# Patient Record
Sex: Male | Born: 1955 | Race: White | Hispanic: No | Marital: Single | Smoking: Former smoker
Health system: Southern US, Community
[De-identification: ages and names within clinical notes are randomized; demographics above are authoritative.]

## PROBLEM LIST (undated history)

## (undated) DIAGNOSIS — I499 Cardiac arrhythmia, unspecified: Secondary | ICD-10-CM

## (undated) DIAGNOSIS — R200 Anesthesia of skin: Secondary | ICD-10-CM

## (undated) DIAGNOSIS — M51379 Other intervertebral disc degeneration, lumbosacral region without mention of lumbar back pain or lower extremity pain: Secondary | ICD-10-CM

## (undated) DIAGNOSIS — Z8489 Family history of other specified conditions: Secondary | ICD-10-CM

## (undated) DIAGNOSIS — G4730 Sleep apnea, unspecified: Secondary | ICD-10-CM

## (undated) DIAGNOSIS — M5430 Sciatica, unspecified side: Secondary | ICD-10-CM

## (undated) DIAGNOSIS — I4819 Other persistent atrial fibrillation: Secondary | ICD-10-CM

## (undated) DIAGNOSIS — I4892 Unspecified atrial flutter: Secondary | ICD-10-CM

## (undated) DIAGNOSIS — M549 Dorsalgia, unspecified: Secondary | ICD-10-CM

## (undated) DIAGNOSIS — K219 Gastro-esophageal reflux disease without esophagitis: Secondary | ICD-10-CM

## (undated) DIAGNOSIS — M5137 Other intervertebral disc degeneration, lumbosacral region: Secondary | ICD-10-CM

## (undated) HISTORY — DX: Other intervertebral disc degeneration, lumbosacral region without mention of lumbar back pain or lower extremity pain: M51.379

## (undated) HISTORY — PX: HERNIA REPAIR: SHX51

## (undated) HISTORY — PX: LUMBAR SPINE SURGERY: SHX701

## (undated) HISTORY — DX: Other persistent atrial fibrillation: I48.19

## (undated) HISTORY — DX: Sciatica, unspecified side: M54.30

## (undated) HISTORY — DX: Other intervertebral disc degeneration, lumbosacral region: M51.37

## (undated) HISTORY — PX: HEMORROIDECTOMY: SUR656

## (undated) HISTORY — DX: Dorsalgia, unspecified: M54.9

## (undated) HISTORY — PX: ADENOIDECTOMY: SUR15

## (undated) HISTORY — PX: TONSILLECTOMY: SUR1361

## (undated) HISTORY — PX: EYE SURGERY: SHX253

## (undated) HISTORY — DX: Unspecified atrial flutter: I48.92

## (undated) HISTORY — PX: APPENDECTOMY: SHX54

---
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
## 2001-05-13 ENCOUNTER — Emergency Department (HOSPITAL_COMMUNITY): Admission: EM | Admit: 2001-05-13 | Discharge: 2001-05-14 | Payer: Self-pay | Admitting: Emergency Medicine

## 2001-05-14 ENCOUNTER — Encounter: Payer: Self-pay | Admitting: Emergency Medicine

## 2003-12-11 ENCOUNTER — Emergency Department (HOSPITAL_COMMUNITY): Admission: EM | Admit: 2003-12-11 | Discharge: 2003-12-12 | Payer: Self-pay | Admitting: Emergency Medicine

## 2004-07-19 ENCOUNTER — Encounter (INDEPENDENT_AMBULATORY_CARE_PROVIDER_SITE_OTHER): Payer: Self-pay | Admitting: Specialist

## 2004-07-19 ENCOUNTER — Ambulatory Visit (HOSPITAL_COMMUNITY): Admission: RE | Admit: 2004-07-19 | Discharge: 2004-07-19 | Payer: Self-pay | Admitting: General Surgery

## 2009-03-15 ENCOUNTER — Encounter: Admission: RE | Admit: 2009-03-15 | Discharge: 2009-03-15 | Payer: Self-pay | Admitting: Internal Medicine

## 2009-09-21 ENCOUNTER — Encounter (INDEPENDENT_AMBULATORY_CARE_PROVIDER_SITE_OTHER): Payer: Self-pay | Admitting: *Deleted

## 2009-09-22 ENCOUNTER — Encounter (INDEPENDENT_AMBULATORY_CARE_PROVIDER_SITE_OTHER): Payer: Self-pay

## 2009-09-26 ENCOUNTER — Ambulatory Visit: Payer: Self-pay | Admitting: Gastroenterology

## 2009-10-02 ENCOUNTER — Ambulatory Visit: Payer: Self-pay | Admitting: Gastroenterology

## 2009-12-21 ENCOUNTER — Encounter: Admission: RE | Admit: 2009-12-21 | Discharge: 2009-12-21 | Payer: Self-pay | Admitting: Internal Medicine

## 2010-03-08 ENCOUNTER — Encounter
Admission: RE | Admit: 2010-03-08 | Discharge: 2010-03-08 | Payer: Self-pay | Source: Home / Self Care | Attending: General Surgery | Admitting: General Surgery

## 2010-03-25 DIAGNOSIS — I4892 Unspecified atrial flutter: Secondary | ICD-10-CM

## 2010-03-25 HISTORY — DX: Unspecified atrial flutter: I48.92

## 2010-04-14 ENCOUNTER — Encounter: Payer: Self-pay | Admitting: Internal Medicine

## 2010-04-24 NOTE — Letter (Signed)
Summary: Previsit letter  [facility]  [address] [place], [place] [address]   Phone: [phone]  Fax: [phone]       09/21/2009 MRN: [id number]  Craig Morgan [address] [place], [place]  [address]  Dear Mr. GLASPY,  Welcome to the Gastroenterology Division at [facility].    You are scheduled to see a nurse for your pre-procedure visit on 09/26/2009 at 8:30am on the 3rd floor at [facility], [address]  We ask that you try to arrive at our office 15 minutes prior to your appointment time to allow for check-in.  Your nurse visit will consist of discussing your medical and surgical history, your immediate family medical history, and your medications.    Please bring a complete list of all your medications or, if you prefer, bring the medication bottles and we will list them.  We will need to be aware of both prescribed and over the counter drugs.  We will need to know exact dosage information as well.  If you are on blood thinners (Coumadin, Plavix, Aggrenox, Ticlid, etc.) please call our office today/prior to your appointment, as we need to consult with your physician about holding your medication.   Please be prepared to read and sign documents such as consent forms, a financial agreement, and acknowledgement forms.  If necessary, and with your consent, a friend or relative is welcome to sit-in on the nurse visit with you.  Please bring your insurance card so that we may make a copy of it.  If your insurance requires a referral to see a specialist, please bring your referral form from your primary care physician.  No co-pay is required for this nurse visit.     If you cannot keep your appointment, please call [phone] to cancel or reschedule prior to your appointment date.  This allows [place] the opportunity to schedule an appointment for another patient in need of care.    Thank you for choosing [facility] for your medical  needs.  We appreciate the opportunity to care for you.  Please visit [place] at our website  to learn more about our practice.                     Sincerely.                                                                                                                   The Gastroenterology Division

## 2010-04-24 NOTE — Procedures (Signed)
Summary: Colonoscopy  Patient: [name] Note: All result statuses are Final unless otherwise noted.  Tests: (1) Colonoscopy (COL)   COL Colonoscopy           DONE     [facility]     [address]     [place], [place]  [address]           COLONOSCOPY PROCEDURE REPORT           PATIENT:  Craig Morgan, Craig Morgan  MR#:  [id number]     BIRTHDATE:  [birth year], 54 yrs. old  GENDER:  male     ENDOSCOPIST:  Rachael Fee, MD     REF. BY:  Tomi Bamberger, NP     PROCEDURE DATE:  10/02/2009     PROCEDURE:  Diagnostic Colonoscopy     ASA CLASS:  Class II     INDICATIONS:  Routine Risk Screening     MEDICATIONS:   Fentanyl 100 mcg IV, Versed 10 mg IV     DESCRIPTION OF PROCEDURE:   After the risks benefits and     alternatives of the procedure were thoroughly explained, informed     consent was obtained.  Digital rectal exam was performed and     revealed no rectal masses.   The LB PCF-[id number] endoscope     was introduced through the anus and advanced to the cecum, which     was identified by both the appendix and ileocecal valve, without     limitations.  The quality of the prep was adequate, using     MoviPrep.  The instrument was then slowly withdrawn as the colon     was fully examined.     <<PROCEDUREIMAGES>>     FINDINGS:  Mild diverticulosis was found in the sigmoid to     descending colon segments (see image1).  This was otherwise a     normal examination of the colon (see image2, image3, and image4).     Retroflexed views in the rectum revealed no abnormalities.    The     scope was then withdrawn from the patient and the procedure     completed.           COMPLICATIONS:  None           ENDOSCOPIC IMPRESSION:     1) Mild diverticulosis in the sigmoid to descending colon     segments     2) Otherwise normal examination; NO polyps or cancers           RECOMMENDATIONS:     1) Continue current colorectal screening recommendations for     "routine risk" patients  with a repeat colonoscopy in 10 years.           REPEAT EXAM:  10 years           ______________________________     Rachael Fee, MD           n.     eSIGNED:   Rachael Fee at 10/02/2009 02:22 PM           Cecilio Asper, [id number]  Note: An exclamation mark (!) indicates a result that was not dispersed into the flowsheet. Document Creation Date: 10/02/2009 2:23 PM _______________________________________________________________________  (1) Order result status: Final Collection or observation date-time: 10/02/2009 14:20 Requested date-time:  Receipt date-time:  Reported date-time:  Referring Physician:   Ordering Physician: Rob Bunting [phone]) Specimen Source:  Source: Launa Grill Order Number: [id number]  Lab site:

## 2010-04-24 NOTE — Letter (Signed)
Summary: [facility] Instructions  [facility]  [address] [place], [place] [address]   Phone: [phone]  Fax: [phone]       Craig Morgan    1955-06-21    MRN: [id number]        Procedure Day /Date:  Monday 10/02/2009     Arrival Time: 1:00 pm      Procedure Time: 2:00 pm     Location of Procedure:                    _ x_  [facility] (4th Floor)                        PREPARATION FOR COLONOSCOPY WITH MOVIPREP   Starting 5 days prior to your procedure Wednesday 7/6 do not eat nuts, seeds, popcorn, corn, beans, peas,  salads, or any raw vegetables.  Do not take any fiber supplements (e.g. Metamucil, Citrucel, and Benefiber).  THE DAY BEFORE YOUR PROCEDURE         DATE: Sunday 7/10  1.  Drink clear liquids the entire day-NO SOLID FOOD  2.  Do not drink anything colored red or purple.  Avoid juices with pulp.  No orange juice.  3.  Drink at least 64 oz. (8 glasses) of fluid/clear liquids during the day to prevent dehydration and help the prep work efficiently.  CLEAR LIQUIDS INCLUDE: Water Jello Ice Popsicles Tea (sugar ok, no milk/cream) Powdered fruit flavored drinks Coffee (sugar ok, no milk/cream) Gatorade Juice: apple, white grape, white cranberry  Lemonade Clear bullion, consomm, broth Carbonated beverages (any kind) Strained chicken noodle soup Hard Candy                             4.  In the morning, mix first dose of MoviPrep solution:    Empty 1 Pouch A and 1 Pouch B into the disposable container    Add lukewarm drinking water to the top line of the container. Mix to dissolve    Refrigerate (mixed solution should be used within 24 hrs)  5.  Begin drinking the prep at 5:00 p.m. The MoviPrep container is divided by 4 marks.   Every 15 minutes drink the solution down to the next mark (approximately 8 oz) until the full liter is complete.   6.  Follow completed prep with 16 oz of clear liquid of your choice (Nothing  red or purple).  Continue to drink clear liquids until bedtime.  7.  Before going to bed, mix second dose of MoviPrep solution:    Empty 1 Pouch A and 1 Pouch B into the disposable container    Add lukewarm drinking water to the top line of the container. Mix to dissolve    Refrigerate  THE DAY OF YOUR PROCEDURE      DATE: Monday 7/11  Beginning at  9:00 a.m. (5 hours before procedure):         1. Every 15 minutes, drink the solution down to the next mark (approx 8 oz) until the full liter is complete.  2. Follow completed prep with 16 oz. of clear liquid of your choice.    3. You may drink clear liquids until 12:00 pm  (2 HOURS BEFORE PROCEDURE).   MEDICATION INSTRUCTIONS  Unless otherwise instructed, you should take regular prescription medications with a small sip of water   as early as possible the  morning of your procedure.         OTHER INSTRUCTIONS  You will need a responsible adult at least 53 years of age to accompany you and drive you home.   This person must remain in the waiting room during your procedure.  Wear loose fitting clothing that is easily removed.  Leave jewelry and other valuables at home.  However, you may wish to bring a book to read or  an iPod/MP3 player to listen to music as you wait for your procedure to start.  Remove all body piercing jewelry and leave at home.  Total time from sign-in until discharge is approximately 2-3 hours.  You should go home directly after your procedure and rest.  You can resume normal activities the  day after your procedure.  The day of your procedure you should not:   Drive   Make legal decisions   Operate machinery   Drink alcohol   Return to work  You will receive specific instructions about eating, activities and medications before you leave.    The above instructions have been reviewed and explained to me by   Ulis Rias RN  September 26, 2009 3:41 PM     I fully understand and can  verbalize these instructions _____________________________ Date _________

## 2010-04-24 NOTE — Procedures (Signed)
Summary: Colonoscopy   Procedures Next Due Date:    Colonoscopy: 09/2019 

## 2010-04-24 NOTE — Miscellaneous (Signed)
Summary: Lec previsit  Clinical Lists Changes  Medications: Added new medication of MOVIPREP 100 GM  SOLR (PEG-KCL-NACL-NASULF-NA ASC-C) As per prep instructions. - Signed Rx of MOVIPREP 100 GM  SOLR (PEG-KCL-NACL-NASULF-NA ASC-C) As per prep instructions.;  #1 x 0;  Signed;  Entered by: Ulis Rias RN;  Authorized by: Rachael Fee MD;  Method used: Electronically to [facility]  [address] [place], [place]  [address], Ph: [phone] or [phone], Fax: [phone] Allergies: Added new allergy or adverse reaction of PENICILLIN Observations: Added new observation of NKA: F (09/26/2009 15:23)    Prescriptions: MOVIPREP 100 GM  SOLR (PEG-KCL-NACL-NASULF-NA ASC-C) As per prep instructions.  #1 x 0   Entered by:   Ulis Rias RN   Authorized by:   Rachael Fee MD   Signed by:   Ulis Rias RN on 09/26/2009   Method used:   [facility]        [facility]  [address] (retail)       [address]       [place], [place]  [address]       Ph: [phone] or [phone]       Fax: [phone]   RxID:   [id number]

## 2010-08-10 NOTE — Op Note (Signed)
NAMEHASAN, DOUSE NO.:  [id number]   MEDICAL RECORD NO.:  [id number]          PATIENT TYPE:  AMB   LOCATION:  DAY                          FACILITY[facility]  [facility]   PHYSICIAN:  Leonie Man, M.D.   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   PROCEDURE:  Procedure for prolapsing hemorrhoids.   SURGEON:  Leonie Man, M.D.   ASSISTANT:  OR tech.   ANESTHESIA:  General.   ESTIMATED BLOOD LOSS:  Minimal.   COMPLICATIONS:  None.  Patient returns to the PACU in good condition.   NOTE:  Mr. 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  He has had recurrent flareups with pain in  the past.  He sometimes has pain for several months.  He comes to the  operating room now after the risks and potential benefits of surgery for  hemorrhoidectomy have been fully discussed and consent obtained .   PROCEDURE:  Following induction of satisfactory general anesthesia, the  patient was placed in the prone jack-knife position.  The buttock cheeks are  spread apart.  Perianal tissues are prepped and draped in the sterile  operative field.  Perianal infiltration of 0.25% Marcaine with epinephrine  is carried out, and then dilatation of the anal verge of approximately three  fingerbreadths is then done.  An operating anoscope is placed into the anus,  and a purse-string suture is on the entire anal verge.  The PPH stapling  device is then placed with the anvil above the suture line, and the suture  is drawn taut and tight.  The [facility] stapler is then applied and screwed down  up to 4 cm into the anus with 4 cm at the anal verge.  The stapler was left  in place over approximately one minute for edema and for hemostasis.  The  suture line was then thoroughly inspected.  Additional bleeding points were  then treated with electrocautery.  There were two very large hemorrhoids at  the 11 o'clock axis with the patient in the prone position, and rubber bands  were placed on these hemorrhoids.  Dissection checked for hemostasis.  Sponge and instrument counts  verified.  I placed a Gelfoam pack along the suture line for additional  hemostasis.  The anesthetic was then reversed after sterile dressings were  placed at the anus.  The patient then removed from the operating room to the  recovery room in stable condition.  Tolerated the procedure.      PB/MEDQ  D:  07/19/2004  T:  07/19/2004  Job:  [id number]

## 2010-09-24 ENCOUNTER — Encounter: Payer: Self-pay | Admitting: Gastroenterology

## 2010-11-02 ENCOUNTER — Ambulatory Visit (INDEPENDENT_AMBULATORY_CARE_PROVIDER_SITE_OTHER): Payer: BC Managed Care – PPO | Admitting: Gastroenterology

## 2010-11-02 ENCOUNTER — Encounter: Payer: Self-pay | Admitting: Gastroenterology

## 2010-11-02 VITALS — BP 138/76 | HR 80 | Wt 259.0 lb

## 2010-11-02 DIAGNOSIS — R55 Syncope and collapse: Secondary | ICD-10-CM

## 2010-11-02 DIAGNOSIS — R112 Nausea with vomiting, unspecified: Secondary | ICD-10-CM

## 2010-11-02 NOTE — Patient Instructions (Addendum)
You will be set up for an upper endoscopy, call back if you decide to schedule your endoscopy. If you have a $700 ded you must pay your ded and then your responsible for 20% of about $1500 cost.  A copy of this information will be made available to Dr. Toni Arthurs, Dr. Nadara Eaton. Some of your meds can cause passing out (requip) hypotension (tizanidine); these may contribute to your symptoms, dizziness (gabapentin).

## 2010-11-02 NOTE — Progress Notes (Signed)
HPI: This is a  very pleasant 55 year old man  Has been having nausea, vomiting after dinner.  Has passed out on 2 of the occasions.  Has had 5-6 episodes of the n/v.  Was passed out for 30 minutes on one occasion, was a hot day.  He gets nauseas, light headed.  His cardiologist feels this is cardiac related.  Told him that GI issue is not is problem (Dr. Nadara Eaton).  Takes RLS med for 3 years, very helpful.  Started tizanidine.  No abd pain with the epidsodes, no dysphagia.  Has noticed HR can vary quite a lot.  Up to 130, down to 80s.   Recent BBlocker started, but then stopped.   Review of systems: Pertinent positive and negative review of systems were noted in the above HPI section.  All other review of systems was otherwise negative.   History reviewed. No pertinent past medical history.  Past Surgical History  Procedure Date  . Hernia repair     x2  . Hemorroidectomy   . Adenoidectomy   . Appendectomy      reports that he has quit smoking. He uses smokeless tobacco. He reports that he drinks alcohol. He reports that he does not use illicit drugs.  family history includes COPD in his mother and Heart disease in his father.    Current Medications, Allergies were all reviewed with the patient via [facility] electronic medical record system.    Physical Exam: BP 138/76  Pulse 80  Wt 259 lb (117.482 kg) Constitutional: generally well-appearing Psychiatric: alert and oriented x3 Eyes: extraocular movements intact Mouth: oral pharynx moist, no lesions Neck: supple no lymphadenopathy Cardiovascular: heart regular rate and rhythm Lungs: clear to auscultation bilaterally Abdomen: soft, nontender, nondistended, no obvious ascites, no peritoneal signs, normal bowel sounds Extremities: no lower extremity edema bilaterally Skin: no lesions on visible extremities    Assessment and plan: 55 y.o. male with intermittent nausea vomiting syncope  He has had 5 or 6  episodes of nausea, vomiting while eating. 2 of these episodes were associated with syncope.  He also describes other even sober the past several months of orthostatic hypotension. Being very dizzy when standing up. One of his medicines as this is a potential side effect, this is his medicine to treat his restless leg syndrome. He does not want to try stopping that medicine because he tells me he can not sleep at all without it. I think an EGD is reasonable given his nausea and vomiting however I suspect that his symptoms are more vasovagal, either primary cardiac related or perhaps related to some of his medicines. Gabapentin also causes dizziness. He is going to see how much his out-of-pocket expense will be for an EGD and I was quite clear that I think it is reasonable to proceed with the test I think he will probably be within normal and will not explain his symptoms.

## 2011-06-17 ENCOUNTER — Ambulatory Visit: Payer: BC Managed Care – PPO

## 2011-06-17 ENCOUNTER — Ambulatory Visit (INDEPENDENT_AMBULATORY_CARE_PROVIDER_SITE_OTHER): Payer: BC Managed Care – PPO | Admitting: Emergency Medicine

## 2011-06-17 DIAGNOSIS — M546 Pain in thoracic spine: Secondary | ICD-10-CM

## 2011-06-17 DIAGNOSIS — J45909 Unspecified asthma, uncomplicated: Secondary | ICD-10-CM

## 2011-06-17 DIAGNOSIS — M545 Low back pain, unspecified: Secondary | ICD-10-CM

## 2011-06-17 DIAGNOSIS — R0683 Snoring: Secondary | ICD-10-CM

## 2011-06-17 DIAGNOSIS — J40 Bronchitis, not specified as acute or chronic: Secondary | ICD-10-CM

## 2011-06-17 LAB — LIPID PANEL
Cholesterol: 163 mg/dL (ref 0–200)
HDL: 38 mg/dL — ABNORMAL LOW (ref >39)
LDL Cholesterol: 104 mg/dL — ABNORMAL HIGH (ref 0–99)
Total CHOL/HDL Ratio: 4.3 ratio
Triglycerides: 104 mg/dL (ref <150)
VLDL: 21 mg/dL (ref 0–40)

## 2011-06-17 LAB — POCT CBC
Granulocyte percent: 66.7 % (ref 37–80)
HCT, POC: 40.7 % — AB (ref 43.5–53.7)
Hemoglobin: 13.4 g/dL — AB (ref 14.1–18.1)
Lymph, poc: 1.2 (ref 0.6–3.4)
MCH, POC: 33.3 pg — AB (ref 27–31.2)
MCHC: 32.9 g/dL (ref 31.8–35.4)
MCV: 101 fL — AB (ref 80–97)
MID (cbc): 0.4 (ref 0–0.9)
MPV: 8.2 fL (ref 0–99.8)
POC Granulocyte: 3.3 (ref 2–6.9)
POC LYMPH PERCENT: 24.3 % (ref 10–50)
POC MID %: 9 % (ref 0–12)
Platelet Count, POC: 245 K/uL (ref 142–424)
RBC: 4.03 M/uL — AB (ref 4.69–6.13)
RDW, POC: 13.8 %
WBC: 5 K/uL (ref 4.6–10.2)

## 2011-06-17 LAB — COMPREHENSIVE METABOLIC PANEL WITH GFR
ALT: 16 U/L (ref 0–53)
AST: 14 U/L (ref 0–37)
Albumin: 4.5 g/dL (ref 3.5–5.2)
Alkaline Phosphatase: 55 U/L (ref 39–117)
BUN: 12 mg/dL (ref 6–23)
CO2: 29 meq/L (ref 19–32)
Calcium: 9.5 mg/dL (ref 8.4–10.5)
Chloride: 105 meq/L (ref 96–112)
Creat: 0.99 mg/dL (ref 0.50–1.35)
Glucose, Bld: 88 mg/dL (ref 70–99)
Potassium: 4.7 meq/L (ref 3.5–5.3)
Sodium: 142 meq/L (ref 135–145)
Total Bilirubin: 0.8 mg/dL (ref 0.3–1.2)
Total Protein: 6.7 g/dL (ref 6.0–8.3)

## 2011-06-17 LAB — TSH: TSH: 1.441 u[IU]/mL (ref 0.350–4.500)

## 2011-06-17 MED ORDER — IPRATROPIUM BROMIDE 0.02 % IN SOLN
0.5000 mg | Freq: Once | RESPIRATORY_TRACT | Status: AC
  Administered 2011-06-17: 0.5 mg via RESPIRATORY_TRACT

## 2011-06-17 MED ORDER — HYDROCODONE-ACETAMINOPHEN 5-325 MG PO TABS: 1.0000 | ORAL_TABLET | ORAL | Status: AC | PRN

## 2011-06-17 MED ORDER — ALBUTEROL SULFATE (2.5 MG/3ML) 0.083% IN NEBU
2.5000 mg | INHALATION_SOLUTION | Freq: Once | RESPIRATORY_TRACT | Status: AC
  Administered 2011-06-17: 2.5 mg via RESPIRATORY_TRACT

## 2011-06-17 MED ORDER — ALBUTEROL SULFATE HFA 108 (90 BASE) MCG/ACT IN AERS: 2.0000 | INHALATION_SPRAY | Freq: Four times a day (QID) | RESPIRATORY_TRACT | Status: AC | PRN

## 2011-06-17 MED ORDER — LEVOFLOXACIN 500 MG PO TABS: 500.0000 mg | ORAL_TABLET | Freq: Every day | ORAL | Status: AC

## 2011-06-17 NOTE — Patient Instructions (Signed)

## 2011-06-17 NOTE — Progress Notes (Signed)
  Subjective:    Patient ID: Craig Morgan, male    DOB: [birth year], 56 y.o.   MRN: [id number]  HPI problems. He's been having three-week history of chest congestion which has been productive of a yellowish white thick mucus. He has no history of asthma. He's never used inhalers is also having significant pain in his right thoracic area. It hurts for him to take a big breath or twists. He also has had pain in his lower back but no radicular symptoms.    Review of Systems     Objective:   Physical Exam  Constitutional: He appears well-developed and well-nourished.  HENT:  Head: Normocephalic.  Eyes: Right eye exhibits no discharge. Left eye exhibits no discharge. No scleral icterus.  Neck: No JVD present. No tracheal deviation present. No thyromegaly present.  Cardiovascular: Normal rate and regular rhythm.  Exam reveals no gallop and no friction rub.   No murmur heard. Pulmonary/Chest: No respiratory distress. He has no wheezes.       He has a prolonged expiratory phase on lung examination however I did not hear any audible wheezes.  Lymphadenopathy:    He has no cervical adenopathy.   [facility] reading (PRIMARY) by  DrDaub chest x-ray shows no acute disease. T-spine films are unremarkable except for a slight scoliosis. T-spine films show a spondylolisthesis of L5 with a arthritic complex in that area         Assessment & Plan:  Patient have an upper and lower back discomfort. He also has reactive airways disease and evidence on exam of an acute bronchitis.

## 2011-09-30 ENCOUNTER — Ambulatory Visit (INDEPENDENT_AMBULATORY_CARE_PROVIDER_SITE_OTHER): Payer: BC Managed Care – PPO | Admitting: Emergency Medicine

## 2011-09-30 VITALS — BP 112/67 | HR 64 | Temp 98.3°F | Resp 18 | Ht 73.0 in | Wt 263.0 lb

## 2011-09-30 DIAGNOSIS — M5410 Radiculopathy, site unspecified: Secondary | ICD-10-CM

## 2011-09-30 DIAGNOSIS — M79609 Pain in unspecified limb: Secondary | ICD-10-CM

## 2011-09-30 DIAGNOSIS — R000 Tachycardia, unspecified: Secondary | ICD-10-CM

## 2011-09-30 DIAGNOSIS — R252 Cramp and spasm: Secondary | ICD-10-CM

## 2011-09-30 DIAGNOSIS — R200 Anesthesia of skin: Secondary | ICD-10-CM

## 2011-09-30 LAB — TSH: TSH: 1.977 u[IU]/mL (ref 0.350–4.500)

## 2011-09-30 LAB — POCT CBC
Granulocyte percent: 75 %G (ref 37–80)
HCT, POC: 47.7 % (ref 43.5–53.7)
Hemoglobin: 15 g/dL (ref 14.1–18.1)
MCV: 106.1 fL — AB (ref 80–97)
POC LYMPH PERCENT: 17.8 %L (ref 10–50)
RBC: 4.5 M/uL — AB (ref 4.69–6.13)
RDW, POC: 14.1 %

## 2011-09-30 LAB — COMPREHENSIVE METABOLIC PANEL
ALT: 25 U/L (ref 0–53)
Albumin: 5 g/dL (ref 3.5–5.2)
CO2: 21 mEq/L (ref 19–32)
Calcium: 9.8 mg/dL (ref 8.4–10.5)
Chloride: 103 mEq/L (ref 96–112)
Glucose, Bld: 111 mg/dL — ABNORMAL HIGH (ref 70–99)
Potassium: 4.5 mEq/L (ref 3.5–5.3)
Sodium: 139 mEq/L (ref 135–145)
Total Protein: 7.6 g/dL (ref 6.0–8.3)

## 2011-09-30 LAB — VITAMIN B12: Vitamin B-12: 370 pg/mL (ref 211–911)

## 2011-09-30 LAB — MAGNESIUM: Magnesium: 2.2 mg/dL (ref 1.5–2.5)

## 2011-09-30 MED ORDER — CYCLOBENZAPRINE HCL 10 MG PO TABS: ORAL_TABLET | ORAL | Status: DC

## 2011-09-30 NOTE — Progress Notes (Signed)
  Subjective:    Patient ID: Craig Morgan, male    DOB: 1955-07-09, 56 y.o.   MRN: [id number]  HPI patient enters with chief complaint of severe aching cramps primarily in his thighs but also somewhat in his lower legs. He has been to a neurologist and evaluated for possible peripheral neuropathy. End of both the for neurology and also the cornerstone to because of the severe pain in both of his legs. He denies weakness in his legs. He has been to Dr. Jacinto Halim for evaluation of chest pain. He states he has undergone multiple tests and has been told everything was okay. He states he has had recent episodes of tachycardia one of which was associated with a loss of consciousness. EMS was called   he did not go to the emergency room to    Review of Systems     Objective:   Physical Exam  Constitutional: He appears well-developed and well-nourished.  HENT:  Head: Normocephalic.  Eyes: Pupils are equal, round, and reactive to light.  Neck: No tracheal deviation present. No thyromegaly present.  Cardiovascular: Normal rate, regular rhythm and normal heart sounds.  Exam reveals no gallop and no friction rub.   No murmur heard. Neurological: He is alert. He displays normal reflexes. No cranial nerve deficit.       Patient has decreased sensation to fine touch decreased vibratory sensation of the lower extremities. There is no motor weakness noted there is some tenderness over the muscles.  Psychiatric: He has a normal mood and affect.   EKG no acute changes Results for orders placed in visit on 09/30/11  POCT CBC      Component Value Range   WBC 7.3  4.6 - 10.2 K/uL   Lymph, poc 1.3  0.6 - 3.4   POC LYMPH PERCENT 17.8  10 - 50 %L   MID (cbc) 0.5  0 - 0.9   POC MID % 7.2  0 - 12 %M   POC Granulocyte 5.5  2 - 6.9   Granulocyte percent 75.0  37 - 80 %G   RBC 4.50 (*) 4.69 - 6.13 M/uL   Hemoglobin 15.0  14.1 - 18.1 g/dL   HCT, POC 14.7  82.9 - 53.7 %   MCV 106.1 (*) 80 - 97 fL   MCH, POC 33.3  (*) 27 - 31.2 pg   MCHC 31.4 (*) 31.8 - 35.4 g/dL   RDW, POC 56.2     Platelet Count, POC 319  142 - 424 K/uL   MPV 8.5  0 - 99.8 fL        Assessment & Plan:  We'll check routine labs CPK sedimentation rate magnesium and potassium levels as sources of cramps .

## 2012-02-04 ENCOUNTER — Encounter (HOSPITAL_COMMUNITY): Payer: Self-pay | Admitting: *Deleted

## 2012-02-04 ENCOUNTER — Ambulatory Visit (HOSPITAL_COMMUNITY): Payer: BC Managed Care – PPO | Admitting: Anesthesiology

## 2012-02-04 ENCOUNTER — Encounter (HOSPITAL_COMMUNITY): Payer: Self-pay | Admitting: Anesthesiology

## 2012-02-04 ENCOUNTER — Ambulatory Visit (HOSPITAL_COMMUNITY)
Admission: RE | Admit: 2012-02-04 | Discharge: 2012-02-04 | Disposition: A | Discharge status: Home / Self Care | Payer: BC Managed Care – PPO | Source: Ambulatory Visit | Attending: Cardiology | Admitting: Cardiology

## 2012-02-04 ENCOUNTER — Encounter (HOSPITAL_COMMUNITY)
Admission: RE | Disposition: A | Discharge status: Home / Self Care | Payer: Self-pay | Source: Ambulatory Visit | Attending: Cardiology

## 2012-02-04 DIAGNOSIS — I4891 Unspecified atrial fibrillation: Secondary | ICD-10-CM | POA: Insufficient documentation

## 2012-02-04 DIAGNOSIS — K219 Gastro-esophageal reflux disease without esophagitis: Secondary | ICD-10-CM | POA: Insufficient documentation

## 2012-02-04 DIAGNOSIS — J45909 Unspecified asthma, uncomplicated: Secondary | ICD-10-CM | POA: Insufficient documentation

## 2012-02-04 HISTORY — PX: CARDIOVERSION: SHX1299

## 2012-02-04 HISTORY — DX: Gastro-esophageal reflux disease without esophagitis: K21.9

## 2012-02-04 HISTORY — DX: Cardiac arrhythmia, unspecified: I49.9

## 2012-02-04 HISTORY — DX: Sleep apnea, unspecified: G47.30

## 2012-02-04 SURGERY — CARDIOVERSION
Anesthesia: Monitor Anesthesia Care

## 2012-02-04 MED ORDER — SODIUM CHLORIDE 0.9 % IJ SOLN: 3.0000 mL | INTRAMUSCULAR | Status: DC | PRN

## 2012-02-04 MED ORDER — SODIUM CHLORIDE 0.9 % IV SOLN
250.0000 mL | INTRAVENOUS | Status: DC
  Administered 2012-02-04: 12:00:00 via INTRAVENOUS

## 2012-02-04 MED ORDER — SODIUM CHLORIDE 0.9 % IJ SOLN: 3.0000 mL | Freq: Two times a day (BID) | INTRAMUSCULAR | Status: DC

## 2012-02-04 MED ORDER — PROPOFOL 10 MG/ML IV BOLUS
INTRAVENOUS | Status: DC | PRN
  Administered 2012-02-04: 110 mg via INTRAVENOUS

## 2012-02-04 NOTE — Anesthesia Postprocedure Evaluation (Signed)
  Anesthesia Post-op Note  Patient: Craig Morgan  Procedure(s) Performed: Procedure(s) (LRB) with comments: CARDIOVERSION (N/A)  Patient Location: PACU  Anesthesia Type:MAC  Level of Consciousness: awake  Airway and Oxygen Therapy: Patient Spontanous Breathing  Post-op Pain: mild  Post-op Assessment: Post-op Vital signs reviewed  Post-op Vital Signs: Reviewed  Complications: No apparent anesthesia complications

## 2012-02-04 NOTE — Preoperative (Signed)
Beta Blockers   Reason not to administer Beta Blockers:Not Applicable 

## 2012-02-04 NOTE — Anesthesia Postprocedure Evaluation (Signed)
  Anesthesia Post-op Note  Patient: Craig Morgan  Procedure(s) Performed: Procedure(s) (LRB) with comments: CARDIOVERSION (N/A)  Patient Location: Endoscopy Unit  Anesthesia Type:General  Level of Consciousness: awake, alert  and oriented  Airway and Oxygen Therapy: Patient Spontanous Breathing  Post-op Pain: 0 /10  Post-op Assessment: Post-op Vital signs reviewed, Patient's Cardiovascular Status Stable, Respiratory Function Stable, Patent Airway and No signs of Nausea or vomiting  Post-op Vital Signs: Reviewed and stable  Complications: No apparent anesthesia complications

## 2012-02-04 NOTE — CV Procedure (Signed)
Direct current cardioversion:  Indication symptomatic A. Fibrillation.  Procedure: Using 110 mg of IV Propofol for achieving deep (Moderate sedation), synchronized direct current cardioversion performed. Patient was delivered with 120 and 150  Joules of electricity X 2 total with success to NSR. Patient tolerated the procedure well. No immediate complication noted.

## 2012-02-04 NOTE — H&P (Signed)
  Please see paper chart  

## 2012-02-04 NOTE — Transfer of Care (Signed)
Immediate Anesthesia Transfer of Care Note  Patient: Craig Morgan  Procedure(s) Performed: Procedure(s) (LRB) with comments: CARDIOVERSION (N/A)  Patient Location: Endoscopy Unit  Anesthesia Type:General  Level of Consciousness: awake, alert  and oriented  Airway & Oxygen Therapy: Patient Spontanous Breathing  Post-op Assessment: Report given to PACU RN and Post -op Vital signs reviewed and stable  Post vital signs: Reviewed and stable  Complications: No apparent anesthesia complications

## 2012-02-04 NOTE — Interval H&P Note (Signed)
History and Physical Interval Note:  02/04/2012 12:06 PM  Craig Morgan  has presented today for surgery, with the diagnosis of a-fib  The various methods of treatment have been discussed with the patient and family. After consideration of risks, benefits and other options for treatment, the patient has consented to  Procedure(s) (LRB) with comments: CARDIOVERSION (N/A) as a surgical intervention .  The patient's history has been reviewed, patient examined, no change in status, stable for surgery.  I have reviewed the patient's chart and labs.  Questions were answered to the patient's satisfaction.     Pamella Pert

## 2012-02-04 NOTE — Anesthesia Preprocedure Evaluation (Addendum)
Anesthesia Evaluation  Patient identified by MRN, date of birth, ID band Patient awake    Reviewed: Allergy & Precautions, H&P , NPO status , Patient's Chart, lab work & pertinent test results  Airway Mallampati: II      Dental  (+) Teeth Intact   Pulmonary shortness of breath, asthma , sleep apnea ,          Cardiovascular + dysrhythmias Atrial Fibrillation Rhythm:Irregular     Neuro/Psych    GI/Hepatic GERD-  Controlled,  Endo/Other    Renal/GU      Musculoskeletal   Abdominal   Peds  Hematology   Anesthesia Other Findings   Reproductive/Obstetrics                          Anesthesia Physical Anesthesia Plan  ASA: III  Anesthesia Plan: General   Post-op Pain Management:    Induction: Intravenous  Airway Management Planned: Simple Face Mask  Additional Equipment:   Intra-op Plan:   Post-operative Plan:   Informed Consent: I have reviewed the patients History and Physical, chart, labs and discussed the procedure including the risks, benefits and alternatives for the proposed anesthesia with the patient or authorized representative who has indicated his/her understanding and acceptance.   Dental advisory given  Plan Discussed with: CRNA and Anesthesiologist  Anesthesia Plan Comments:         Anesthesia Quick Evaluation

## 2012-02-05 ENCOUNTER — Encounter (HOSPITAL_COMMUNITY): Payer: Self-pay | Admitting: Cardiology

## 2012-11-18 ENCOUNTER — Ambulatory Visit (INDEPENDENT_AMBULATORY_CARE_PROVIDER_SITE_OTHER): Payer: BC Managed Care – PPO | Admitting: Surgery

## 2012-11-18 ENCOUNTER — Encounter (INDEPENDENT_AMBULATORY_CARE_PROVIDER_SITE_OTHER): Payer: Self-pay

## 2012-11-18 ENCOUNTER — Encounter (INDEPENDENT_AMBULATORY_CARE_PROVIDER_SITE_OTHER): Payer: Self-pay | Admitting: Surgery

## 2012-11-18 VITALS — BP 132/86 | HR 64 | Temp 98.3°F | Resp 16 | Ht 74.0 in | Wt 281.6 lb

## 2012-11-18 DIAGNOSIS — M5137 Other intervertebral disc degeneration, lumbosacral region: Secondary | ICD-10-CM

## 2012-11-18 DIAGNOSIS — M5136 Other intervertebral disc degeneration, lumbar region: Secondary | ICD-10-CM | POA: Insufficient documentation

## 2012-11-18 DIAGNOSIS — M792 Neuralgia and neuritis, unspecified: Secondary | ICD-10-CM | POA: Insufficient documentation

## 2012-11-18 DIAGNOSIS — G5790 Unspecified mononeuropathy of unspecified lower limb: Secondary | ICD-10-CM

## 2012-11-18 DIAGNOSIS — M51369 Other intervertebral disc degeneration, lumbar region without mention of lumbar back pain or lower extremity pain: Secondary | ICD-10-CM | POA: Insufficient documentation

## 2012-11-18 DIAGNOSIS — Z8719 Personal history of other diseases of the digestive system: Secondary | ICD-10-CM | POA: Insufficient documentation

## 2012-11-18 DIAGNOSIS — Z8679 Personal history of other diseases of the circulatory system: Secondary | ICD-10-CM

## 2012-11-18 DIAGNOSIS — I4819 Other persistent atrial fibrillation: Secondary | ICD-10-CM | POA: Insufficient documentation

## 2012-11-18 DIAGNOSIS — G4733 Obstructive sleep apnea (adult) (pediatric): Secondary | ICD-10-CM

## 2012-11-18 NOTE — Patient Instructions (Signed)
Inguinal Hernia, Adult Muscles help keep everything in the body in its proper place. But if a weak spot in the muscles develops, something can poke through. That is called a hernia. When this happens in the lower part of the belly (abdomen), it is called an inguinal hernia. (It takes its name from a part of the body in this region called the inguinal canal.) A weak spot in the wall of muscles lets some fat or part of the small intestine bulge through. An inguinal hernia can develop at any age. Men get them more often than women. CAUSES  In adults, an inguinal hernia develops over time.  It can be triggered by:  Suddenly straining the muscles of the lower abdomen.  Lifting heavy objects.  Straining to have a bowel movement. Difficult bowel movements (constipation) can lead to this.  Constant coughing. This may be caused by smoking or lung disease.  Being overweight.  Being pregnant.  Working at a job that requires long periods of standing or heavy lifting.  Having had an inguinal hernia before. One type can be an emergency situation. It is called a strangulated inguinal hernia. It develops if part of the small intestine slips through the weak spot and cannot get back into the abdomen. The blood supply can be cut off. If that happens, part of the intestine may die. This situation requires emergency surgery. SYMPTOMS  Often, a small inguinal hernia has no symptoms. It is found when a healthcare provider does a physical exam. Larger hernias usually have symptoms.   In adults, symptoms may include:  A lump in the groin. This is easier to see when the person is standing. It might disappear when lying down.  In men, a lump in the scrotum.  Pain or burning in the groin. This occurs especially when lifting, straining or coughing.  A dull ache or feeling of pressure in the groin.  Signs of a strangulated hernia can include:  A bulge in the groin that becomes very painful and tender to the  touch.  A bulge that turns red or purple.  Fever, nausea and vomiting.  Inability to have a bowel movement or to pass gas. DIAGNOSIS  To decide if you have an inguinal hernia, a healthcare provider will probably do a physical examination.  This will include asking questions about any symptoms you have noticed.  The healthcare provider might feel the groin area and ask you to cough. If an inguinal hernia is felt, the healthcare provider may try to slide it back into the abdomen.  Usually no other tests are needed. TREATMENT  Treatments can vary. The size of the hernia makes a difference. Options include:  Watchful waiting. This is often suggested if the hernia is small and you have had no symptoms.  No medical procedure will be done unless symptoms develop.  You will need to watch closely for symptoms. If any occur, contact your healthcare provider right away.  Surgery. This is used if the hernia is larger or you have symptoms.  Open surgery. This is usually an outpatient procedure (you will not stay overnight in a hospital). An cut (incision) is made through the skin in the groin. The hernia is put back inside the abdomen. The weak area in the muscles is then repaired by herniorrhaphy or hernioplasty. Herniorrhaphy: in this type of surgery, the weak muscles are sewn back together. Hernioplasty: a patch or mesh is used to close the weak area in the abdominal wall.  Laparoscopy.   In this procedure, a surgeon makes small incisions. A thin tube with a tiny video camera (called a laparoscope) is put into the abdomen. The surgeon repairs the hernia with mesh by looking with the video camera and using two long instruments. HOME CARE INSTRUCTIONS   After surgery to repair an inguinal hernia:  You will need to take pain medicine prescribed by your healthcare provider. Follow all directions carefully.  You will need to take care of the wound from the incision.  Your activity will be  restricted for awhile. This will probably include no heavy lifting for several weeks. You also should not do anything too active for a few weeks. When you can return to work will depend on the type of job that you have.  During "watchful waiting" periods, you should:  Maintain a healthy weight.  Eat a diet high in fiber (fruits, vegetables and whole grains).  Drink plenty of fluids to avoid constipation. This means drinking enough water and other liquids to keep your urine clear or pale yellow.  Do not lift heavy objects.  Do not stand for long periods of time.  Quit smoking. This should keep you from developing a frequent cough. SEEK MEDICAL CARE IF:   A bulge develops in your groin area.  You feel pain, a burning sensation or pressure in the groin. This might be worse if you are lifting or straining.  You develop a fever of more than 100.5 F (38.1 C). SEEK IMMEDIATE MEDICAL CARE IF:   Pain in the groin increases suddenly.  A bulge in the groin gets bigger suddenly and does not go down.  For men, there is sudden pain in the scrotum. Or, the size of the scrotum increases.  A bulge in the groin area becomes red or purple and is painful to touch.  You have nausea or vomiting that does not go away.  You feel your heart beating much faster than normal.  You cannot have a bowel movement or pass gas.  You develop a fever of more than 102.0 F (38.9 C). Document Released: 07/28/2008 Document Revised: 06/03/2011 Document Reviewed: 07/28/2008 [facility] Patient Information 2014 [place], [place].  Radicular Pain Radicular pain in either the arm or leg is usually from a bulging or herniated disk in the spine. A piece of the herniated disk may press against the nerves as the nerves exit the spine. This causes pain which is felt at the tips of the nerves down the arm or leg. Other causes of radicular pain may include:  Fractures.  Heart disease.  Cancer.  An abnormal and usually  degenerative state of the nervous system or nerves (neuropathy). Diagnosis may require CT or MRI scanning to determine the primary cause.  Nerves that start at the neck (nerve roots) may cause radicular pain in the outer shoulder and arm. It can spread down to the thumb and fingers. The symptoms vary depending on which nerve root has been affected. In most cases radicular pain improves with conservative treatment. Neck problems may require physical therapy, a neck collar, or cervical traction. Treatment may take many weeks, and surgery may be considered if the symptoms do not improve.  Conservative treatment is also recommended for sciatica. Sciatica causes pain to radiate from the lower back or buttock area down the leg into the foot. Often there is a history of back problems. Most patients with sciatica are better after 2 to 4 weeks of rest and other supportive care. Short term bed rest can  reduce the disk pressure considerably. Sitting, however, is not a good position since this increases the pressure on the disk. You should avoid bending, lifting, and all other activities which make the problem worse. Traction can be used in severe cases. Surgery is usually reserved for patients who do not improve within the first months of treatment. Only take over-the-counter or prescription medicines for pain, discomfort, or fever as directed by your caregiver. Narcotics and muscle relaxants may help by relieving more severe pain and spasm and by providing mild sedation. Cold or massage can give significant relief. Spinal manipulation is not recommended. It can increase the degree of disc protrusion. Epidural steroid injections are often effective treatment for radicular pain. These injections deliver medicine to the spinal nerve in the space between the protective covering of the spinal cord and back bones (vertebrae). Your caregiver can give you more information about steroid injections. These injections are most  effective when given within two weeks of the onset of pain.  You should see your caregiver for follow up care as recommended. A program for neck and back injury rehabilitation with stretching and strengthening exercises is an important part of management.  SEEK IMMEDIATE MEDICAL CARE IF:  You develop increased pain, weakness, or numbness in your arm or leg.  You develop difficulty with bladder or bowel control.  You develop abdominal pain. Document Released: 04/18/2004 Document Revised: 06/03/2011 Document Reviewed: 07/04/2008 [facility] Patient Information 2014 [place], [place].

## 2012-11-18 NOTE — Progress Notes (Signed)
Chief Complaint:  Asst. Left inguinal neuralgia since herniorrhaphy with mesh at the surgical center and 2009.  History of Present Illness:  Craig Morgan is an 57 y.o. male who was found to have a left inguinal hernia and referred for repair in July 2009. A left inguinal hernia was repaired in an open fashion by Dr. Carolynne Edouard at the surgical center [facility] using ultra Pro mesh and sewn in place with 2-0 Prolene. Since then he has noticed triggerpoints in the left inguinal region with pain. He was subsequently seen and found to have a right inguinal hernia and a recurrent left inguinal hernia which may have been just fat in his canal before that he underwent a laparoscopic bilateral inguinal hernia repair in [place].  That did not cause the pain in his left groin to go away. I discussed this with him and discussed expiration to remove the Prolene sutures anchoring the mesh and possibly remove some of the mesh to see if this would alleviate the pain. Currently he's been placed on Lyrica for the pain.  Schedule him for open exploration of the inguinal region to remove sutures and possibly mesh. According to Dr. Billey Chang  op note the ileal inguinal nerve was divided and ligated with silk.  Past Medical History  Diagnosis Date  . Dysrhythmia     A fib  . Asthma     hx of  . Shortness of breath     rest and exertion  . Sleep apnea     cpap  . GERD (gastroesophageal reflux disease)   . Arthritis     Back    Past Surgical History  Procedure Laterality Date  . Hernia repair      x2  . Hemorroidectomy    . Adenoidectomy    . Appendectomy    . Cardioversion  02/04/2012    Procedure: CARDIOVERSION;  Surgeon: Pamella Pert, MD;  Location: [facility] ENDOSCOPY;  Service: Cardiovascular;  Laterality: N/A;    Current Outpatient Prescriptions  Medication Sig Dispense Refill  . Cholecalciferol (VITAMIN D3) 1000 UNITS CAPS Take 1 capsule by mouth daily.        . cyclobenzaprine (FLEXERIL) 10 MG tablet  Take 1 tablet at bedtime  30 tablet  11  . gabapentin (NEURONTIN) 100 MG capsule Take 100 mg by mouth 3 (three) times daily.        . Garlic 2000 MG CAPS Take 1 capsule by mouth daily.        Marland Kitchen LYRICA 150 MG capsule       . Omega-3 Fatty Acids (FISH OIL) 1000 MG CAPS Take 1 capsule by mouth daily.        Marland Kitchen omeprazole (PRILOSEC) 40 MG capsule Take 20 mg by mouth daily.       . propranolol ER (INDERAL LA) 160 MG SR capsule       . Rivaroxaban (XARELTO PO) Take by mouth daily.      Marland Kitchen rOPINIRole (REQUIP) 3 MG tablet Take 6 mg by mouth at bedtime.       . Tamsulosin HCl (FLOMAX) 0.4 MG CAPS Take 0.4 mg by mouth daily.        Marland Kitchen tiZANidine (ZANAFLEX) 4 MG tablet Take 4 mg by mouth every 6 (six) hours as needed.        No current facility-administered medications for this visit.   [place] Family History  Problem Relation Age of Onset  . Heart disease Father   . Heart attack Father   .  COPD Mother   . Heart attack Mother   . Cancer Maternal Grandfather     lung   Social History:   reports that he has quit smoking. He uses smokeless tobacco. He reports that he drinks about 86.4 ounces of alcohol per week. He reports that he does not use illicit drugs.   REVIEW OF SYSTEMS - PERTINENT POSITIVES ONLY: Positive for back pain with left foot drop. Worked up by [facility]. MRI showed probable left L4 nerve root compression.  Physical Exam:   Blood pressure 132/86, pulse 64, temperature 98.3 F (36.8 C), temperature source Oral, resp. rate 16, height 6\' 2"  (1.88 m), weight 281 lb 9.6 oz (127.733 kg). Body mass index is 36.14 kg/(m^2).  Gen:  WDWN white male NAD  Neurological: Alert and oriented to person, place, and time. Motor and sensory function is grossly intact  Head: Normocephalic and atraumatic.  Eyes: Conjunctivae are normal. Pupils are equal, round, and reactive to light. No scleral icterus.  Neck: Normal range of motion. Neck supple. No tracheal deviation or thyromegaly  present.  Cardiovascular:  SR without murmurs or gallops.  No carotid bruits Respiratory: Effort normal.  No respiratory distress. No chest wall tenderness. Breath sounds normal.  No wheezes, rales or rhonchi.  Abdomen:  nontender GU:  Tender trigger points in the bed of hernia repair. Palpable Prolene noted Musculoskeletal: Normal range of motion. Extremities are nontender. No cyanosis, edema or clubbing noted Lymphadenopathy: No cervical, preauricular, postauricular or axillary adenopathy is present Skin: Skin is warm and dry. No rash noted. No diaphoresis. No erythema. No pallor. Pscyh: Normal mood and affect. Behavior is normal. Judgment and thought content normal.   LABORATORY RESULTS: No results found for this or any previous visit (from the past 48 hour(s)).  RADIOLOGY RESULTS: No results found.  Problem List: Patient Active Problem List   Diagnosis Date Noted  . OSA on CPAP 11/18/2012  . [facility] July [facility] [place] Carolynne Edouard 11/18/2012  . Status post laparoscopic bilateral hernia repair 2010 in [facility] 11/18/2012  . Neuralgia of left inguinal region 11/18/2012  . 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 H/O atrial fibrillation: cardioversion 11/18/2012    Assessment & Plan: Left inguinal neuralgia. Plan open exploration with partial removal of mesh and/or Prolene sutures.    Matt B. Daphine Deutscher, MD, [facility] [phone] beeper [phone]  11/18/2012 11:48 AM

## 2012-11-19 ENCOUNTER — Ambulatory Visit (INDEPENDENT_AMBULATORY_CARE_PROVIDER_SITE_OTHER): Payer: Self-pay | Admitting: Surgery

## 2012-11-25 ENCOUNTER — Encounter (HOSPITAL_COMMUNITY): Payer: Self-pay | Admitting: Pharmacy Technician

## 2012-12-02 ENCOUNTER — Encounter (HOSPITAL_COMMUNITY)
Admission: RE | Admit: 2012-12-02 | Discharge: 2012-12-02 | Disposition: A | Discharge status: Home / Self Care | Payer: BC Managed Care – PPO | Source: Ambulatory Visit | Attending: Surgery | Admitting: Surgery

## 2012-12-02 ENCOUNTER — Encounter (HOSPITAL_COMMUNITY): Payer: Self-pay

## 2012-12-02 ENCOUNTER — Ambulatory Visit (HOSPITAL_COMMUNITY)
Admission: RE | Admit: 2012-12-02 | Discharge: 2012-12-02 | Disposition: A | Discharge status: Home / Self Care | Payer: BC Managed Care – PPO | Source: Ambulatory Visit | Attending: Surgery | Admitting: Surgery

## 2012-12-02 DIAGNOSIS — Z01818 Encounter for other preprocedural examination: Secondary | ICD-10-CM | POA: Insufficient documentation

## 2012-12-02 DIAGNOSIS — G5790 Unspecified mononeuropathy of unspecified lower limb: Secondary | ICD-10-CM | POA: Insufficient documentation

## 2012-12-02 DIAGNOSIS — Z01812 Encounter for preprocedural laboratory examination: Secondary | ICD-10-CM | POA: Insufficient documentation

## 2012-12-02 HISTORY — DX: Anesthesia of skin: R20.0

## 2012-12-02 LAB — BASIC METABOLIC PANEL
Chloride: 103 mEq/L (ref 96–112)
Creatinine, Ser: 0.9 mg/dL (ref 0.50–1.35)
GFR calc Af Amer: >90 mL/min (ref >90)
GFR calc non Af Amer: >90 mL/min (ref >90)
Potassium: 4.3 mEq/L (ref 3.5–5.1)

## 2012-12-02 LAB — CBC
MCV: 100.5 fL — ABNORMAL HIGH (ref 78.0–100.0)
Platelets: 197 10*3/uL (ref 150–400)
RDW: 13.8 % (ref 11.5–15.5)
WBC: 4.6 10*3/uL (ref 4.0–10.5)

## 2012-12-02 MED ORDER — CHLORHEXIDINE GLUCONATE 4 % EX LIQD
1.0000 "application " | Freq: Once | CUTANEOUS | Status: DC
  Filled 2012-12-02: qty 15

## 2012-12-02 NOTE — Pre-Procedure Instructions (Signed)
EKG REPORT 02/13/12 DR. GANJI AND CARDIOLOGY OFFICE NOTE 02/05/12 ON PT'S CHART.

## 2012-12-02 NOTE — Patient Instructions (Addendum)
YOUR SURGERY IS SCHEDULED AT [facility]  ON:  Tuesday  9/16  REPORT TO [facility] AT:  12:45PM      PHONE # FOR SHORT STAY IS [phone]             FLEET ENEMA THE NIGHT BEFORE YOUR SURGERY.  YOU CAN BUY AT THE DRUG STORE OR GROCERY STORE - FOLLOW DIRECTIONS ON THE ENEMA.  DO NOT  EAT ANYTHING AFTER MIDNIGHT THE NIGHT BEFORE YOUR SURGERY.   NO FOOD, NO CHEWING GUM, NO MINTS, NO CANDIES, NO CHEWING TOBACCO. YOU MAY HAVE CLEAR LIQUIDS TO DRINK FROM MIDNIGHT UNTIL 8:45 AM DAY OF YOUR SURGERY - LIKE WATER.  NOTHING TO DRINK AFTER 8:45 AM DAY OF YOUR SURGERY.  PLEASE TAKE THE FOLLOWING MEDICATIONS THE AM OF YOUR SURGERY WITH A FEW SIPS OF WATER:  OMEPRAZOLE, PROPRANOLOL    IF YOU HAVE SLEEP APNEA AND USE CPAP OR BIPAP--PLEASE BRING THE MASK AND THE TUBING.  DO NOT BRING YOUR MACHINE.  DO NOT BRING VALUABLES, MONEY, CREDIT CARDS.  DO NOT WEAR JEWELRY, MAKE-UP, NAIL POLISH AND NO METAL PINS OR CLIPS IN YOUR HAIR. CONTACT LENS, DENTURES / PARTIALS, GLASSES SHOULD NOT BE WORN TO SURGERY AND IN MOST CASES-HEARING AIDS WILL NEED TO BE REMOVED.  BRING YOUR GLASSES CASE, ANY EQUIPMENT NEEDED FOR YOUR CONTACT LENS. FOR PATIENTS ADMITTED TO THE HOSPITAL--CHECK OUT TIME THE DAY OF DISCHARGE IS 11:00 AM.  ALL INPATIENT ROOMS ARE PRIVATE - WITH BATHROOM, TELEPHONE, TELEVISION AND WIFI INTERNET.  IF YOU ARE BEING DISCHARGED THE SAME DAY OF YOUR SURGERY--YOU CAN NOT DRIVE YOURSELF HOME--AND SHOULD NOT GO HOME ALONE BY TAXI OR BUS.  NO DRIVING OR OPERATING MACHINERY FOR 24 HOURS FOLLOWING ANESTHESIA / PAIN MEDICATIONS.  PLEASE MAKE ARRANGEMENTS FOR SOMEONE TO BE WITH YOU AT HOME THE FIRST 24 HOURS AFTER SURGERY. RESPONSIBLE DRIVER'S NAME  [name]                                               PHONE #   [phone]                            FAILURE TO FOLLOW THESE INSTRUCTIONS MAY RESULT IN THE CANCELLATION OF YOUR SURGERY.   PATIENT SIGNATURE_________________________________

## 2012-12-08 ENCOUNTER — Encounter (HOSPITAL_COMMUNITY): Payer: Self-pay | Admitting: *Deleted

## 2012-12-08 ENCOUNTER — Encounter (HOSPITAL_COMMUNITY): Payer: Self-pay | Admitting: Anesthesiology

## 2012-12-08 ENCOUNTER — Ambulatory Visit (HOSPITAL_COMMUNITY): Payer: BC Managed Care – PPO | Admitting: Anesthesiology

## 2012-12-08 ENCOUNTER — Encounter (HOSPITAL_COMMUNITY)
Admission: RE | Disposition: A | Discharge status: Home / Self Care | Payer: Self-pay | Source: Ambulatory Visit | Attending: Surgery

## 2012-12-08 ENCOUNTER — Ambulatory Visit (HOSPITAL_COMMUNITY)
Admission: RE | Admit: 2012-12-08 | Discharge: 2012-12-08 | Disposition: A | Discharge status: Home / Self Care | Payer: BC Managed Care – PPO | Source: Ambulatory Visit | Attending: Surgery | Admitting: Surgery

## 2012-12-08 DIAGNOSIS — M795 Residual foreign body in soft tissue: Secondary | ICD-10-CM

## 2012-12-08 DIAGNOSIS — M216X9 Other acquired deformities of unspecified foot: Secondary | ICD-10-CM | POA: Insufficient documentation

## 2012-12-08 DIAGNOSIS — M549 Dorsalgia, unspecified: Secondary | ICD-10-CM | POA: Insufficient documentation

## 2012-12-08 DIAGNOSIS — K219 Gastro-esophageal reflux disease without esophagitis: Secondary | ICD-10-CM | POA: Insufficient documentation

## 2012-12-08 DIAGNOSIS — G4733 Obstructive sleep apnea (adult) (pediatric): Secondary | ICD-10-CM | POA: Insufficient documentation

## 2012-12-08 DIAGNOSIS — Z79899 Other long term (current) drug therapy: Secondary | ICD-10-CM | POA: Insufficient documentation

## 2012-12-08 DIAGNOSIS — Z87891 Personal history of nicotine dependence: Secondary | ICD-10-CM | POA: Insufficient documentation

## 2012-12-08 DIAGNOSIS — I4891 Unspecified atrial fibrillation: Secondary | ICD-10-CM | POA: Insufficient documentation

## 2012-12-08 DIAGNOSIS — Z9889 Other specified postprocedural states: Secondary | ICD-10-CM | POA: Insufficient documentation

## 2012-12-08 DIAGNOSIS — Z9089 Acquired absence of other organs: Secondary | ICD-10-CM | POA: Insufficient documentation

## 2012-12-08 DIAGNOSIS — G5790 Unspecified mononeuropathy of unspecified lower limb: Secondary | ICD-10-CM | POA: Insufficient documentation

## 2012-12-08 DIAGNOSIS — N509 Disorder of male genital organs, unspecified: Secondary | ICD-10-CM | POA: Insufficient documentation

## 2012-12-08 DIAGNOSIS — G8929 Other chronic pain: Secondary | ICD-10-CM | POA: Insufficient documentation

## 2012-12-08 HISTORY — PX: INGUINAL HERNIA REPAIR: SHX194

## 2012-12-08 SURGERY — REPAIR, HERNIA, INGUINAL, ADULT
Anesthesia: General | Site: Groin | Laterality: Left | Wound class: Clean

## 2012-12-08 MED ORDER — ONDANSETRON HCL 4 MG/2ML IJ SOLN: 4.0000 mg | Freq: Four times a day (QID) | INTRAMUSCULAR | Status: DC | PRN

## 2012-12-08 MED ORDER — LACTATED RINGERS IV SOLN
INTRAVENOUS | Status: DC
  Administered 2012-12-08: 1000 mL via INTRAVENOUS
  Administered 2012-12-08: 15:00:00 via INTRAVENOUS

## 2012-12-08 MED ORDER — NEOSTIGMINE METHYLSULFATE 1 MG/ML IJ SOLN
INTRAMUSCULAR | Status: DC | PRN
  Administered 2012-12-08: 5 mg via INTRAVENOUS

## 2012-12-08 MED ORDER — SODIUM CHLORIDE 0.9 % IJ SOLN: 3.0000 mL | INTRAMUSCULAR | Status: DC | PRN

## 2012-12-08 MED ORDER — ACETAMINOPHEN 650 MG RE SUPP
650.0000 mg | RECTAL | Status: DC | PRN
  Filled 2012-12-08: qty 1

## 2012-12-08 MED ORDER — SODIUM CHLORIDE 0.9 % IJ SOLN: 3.0000 mL | Freq: Two times a day (BID) | INTRAMUSCULAR | Status: DC

## 2012-12-08 MED ORDER — LIDOCAINE HCL (CARDIAC) 20 MG/ML IV SOLN: INTRAVENOUS | Status: DC | PRN

## 2012-12-08 MED ORDER — GLYCOPYRROLATE 0.2 MG/ML IJ SOLN
INTRAMUSCULAR | Status: DC | PRN
  Administered 2012-12-08: .8 mg via INTRAVENOUS

## 2012-12-08 MED ORDER — MIDAZOLAM HCL 5 MG/5ML IJ SOLN
INTRAMUSCULAR | Status: DC | PRN
  Administered 2012-12-08: 2 mg via INTRAVENOUS

## 2012-12-08 MED ORDER — OXYCODONE-ACETAMINOPHEN 5-325 MG PO TABS: 1.0000 | ORAL_TABLET | ORAL | Status: DC | PRN

## 2012-12-08 MED ORDER — BUPIVACAINE HCL (PF) 0.5 % IJ SOLN
INTRAMUSCULAR | Status: AC
  Filled 2012-12-08: qty 30

## 2012-12-08 MED ORDER — LACTATED RINGERS IV SOLN: INTRAVENOUS | Status: DC

## 2012-12-08 MED ORDER — HEPARIN SODIUM (PORCINE) 5000 UNIT/ML IJ SOLN
5000.0000 [IU] | Freq: Once | INTRAMUSCULAR | Status: AC
  Administered 2012-12-08: 5000 [IU] via SUBCUTANEOUS
  Filled 2012-12-08: qty 1

## 2012-12-08 MED ORDER — MEPERIDINE HCL 50 MG/ML IJ SOLN: 6.2500 mg | INTRAMUSCULAR | Status: DC | PRN

## 2012-12-08 MED ORDER — LIDOCAINE HCL (CARDIAC) 20 MG/ML IV SOLN
INTRAVENOUS | Status: DC | PRN
  Administered 2012-12-08: 50 mg via INTRAVENOUS

## 2012-12-08 MED ORDER — CIPROFLOXACIN IN D5W 400 MG/200ML IV SOLN
400.0000 mg | INTRAVENOUS | Status: AC
  Administered 2012-12-08: 400 mg via INTRAVENOUS

## 2012-12-08 MED ORDER — 0.9 % SODIUM CHLORIDE (POUR BTL) OPTIME
TOPICAL | Status: DC | PRN
  Administered 2012-12-08: 1000 mL

## 2012-12-08 MED ORDER — BUPIVACAINE LIPOSOME 1.3 % IJ SUSP
20.0000 mL | Freq: Once | INTRAMUSCULAR | Status: DC
  Filled 2012-12-08: qty 20

## 2012-12-08 MED ORDER — PROPRANOLOL HCL ER 160 MG PO CP24
160.0000 mg | ORAL_CAPSULE | Freq: Every day | ORAL | Status: AC
  Administered 2012-12-08: 160 mg via ORAL
  Filled 2012-12-08: qty 1

## 2012-12-08 MED ORDER — SODIUM CHLORIDE 0.9 % IV SOLN: 250.0000 mL | INTRAVENOUS | Status: DC | PRN

## 2012-12-08 MED ORDER — FENTANYL CITRATE 0.05 MG/ML IJ SOLN
INTRAMUSCULAR | Status: DC | PRN
  Administered 2012-12-08 (×4): 50 ug via INTRAVENOUS

## 2012-12-08 MED ORDER — EPHEDRINE SULFATE 50 MG/ML IJ SOLN
INTRAMUSCULAR | Status: DC | PRN
  Administered 2012-12-08 (×2): 5 mg via INTRAVENOUS
  Administered 2012-12-08: 10 mg via INTRAVENOUS

## 2012-12-08 MED ORDER — PROPOFOL 10 MG/ML IV BOLUS
INTRAVENOUS | Status: DC | PRN
  Administered 2012-12-08: 20 mg via INTRAVENOUS
  Administered 2012-12-08: 180 mg via INTRAVENOUS

## 2012-12-08 MED ORDER — ROCURONIUM BROMIDE 100 MG/10ML IV SOLN
INTRAVENOUS | Status: DC | PRN
  Administered 2012-12-08: 50 mg via INTRAVENOUS
  Administered 2012-12-08: 20 mg via INTRAVENOUS
  Administered 2012-12-08: 10 mg via INTRAVENOUS

## 2012-12-08 MED ORDER — OXYCODONE HCL 5 MG PO TABS: 5.0000 mg | ORAL_TABLET | ORAL | Status: DC | PRN

## 2012-12-08 MED ORDER — ACETAMINOPHEN 325 MG PO TABS: 650.0000 mg | ORAL_TABLET | ORAL | Status: DC | PRN

## 2012-12-08 MED ORDER — BUPIVACAINE LIPOSOME 1.3 % IJ SUSP
INTRAMUSCULAR | Status: DC | PRN
  Administered 2012-12-08: 20 mL

## 2012-12-08 MED ORDER — FENTANYL CITRATE 0.05 MG/ML IJ SOLN
INTRAMUSCULAR | Status: AC
  Filled 2012-12-08: qty 2

## 2012-12-08 MED ORDER — CIPROFLOXACIN IN D5W 400 MG/200ML IV SOLN
INTRAVENOUS | Status: AC
  Filled 2012-12-08: qty 200

## 2012-12-08 MED ORDER — FENTANYL CITRATE 0.05 MG/ML IJ SOLN
25.0000 ug | INTRAMUSCULAR | Status: DC | PRN
  Administered 2012-12-08: 25 ug via INTRAVENOUS

## 2012-12-08 MED ORDER — PROMETHAZINE HCL 25 MG/ML IJ SOLN: 6.2500 mg | INTRAMUSCULAR | Status: DC | PRN

## 2012-12-08 MED ORDER — ONDANSETRON HCL 4 MG/2ML IJ SOLN
INTRAMUSCULAR | Status: DC | PRN
  Administered 2012-12-08: 4 mg via INTRAVENOUS

## 2012-12-08 SURGICAL SUPPLY — 42 items
ADH SKN CLS APL DERMABOND .7 (GAUZE/BANDAGES/DRESSINGS) ×1
APL SKNCLS STERI-STRIP NONHPOA (GAUZE/BANDAGES/DRESSINGS)
BENZOIN TINCTURE PRP APPL 2/3 (GAUZE/BANDAGES/DRESSINGS) IMPLANT
BLADE HEX COATED 2.75 (ELECTRODE) ×2 IMPLANT
BLADE SURG 15 STRL LF DISP TIS (BLADE) ×1 IMPLANT
BLADE SURG 15 STRL SS (BLADE) ×2
BLADE SURG SZ10 CARB STEEL (BLADE) ×2 IMPLANT
CANISTER SUCTION 2500CC (MISCELLANEOUS) ×2 IMPLANT
CLOTH BEACON ORANGE TIMEOUT ST (SAFETY) ×2 IMPLANT
DECANTER SPIKE VIAL GLASS SM (MISCELLANEOUS) ×2 IMPLANT
DERMABOND ADVANCED (GAUZE/BANDAGES/DRESSINGS) ×1
DERMABOND ADVANCED .7 DNX12 (GAUZE/BANDAGES/DRESSINGS) IMPLANT
DISSECTOR ROUND CHERRY 3/8 STR (MISCELLANEOUS) ×1 IMPLANT
DRAIN PENROSE 18X1/2 LTX STRL (DRAIN) ×2 IMPLANT
DRAPE LAPAROTOMY TRNSV 102X78 (DRAPE) ×2 IMPLANT
ELECT REM PT RETURN 9FT ADLT (ELECTROSURGICAL) ×2
ELECTRODE REM PT RTRN 9FT ADLT (ELECTROSURGICAL) ×1 IMPLANT
GLOVE BIOGEL M 8.0 STRL (GLOVE) ×2 IMPLANT
GOWN STRL REIN XL XLG (GOWN DISPOSABLE) ×4 IMPLANT
KIT BASIN OR (CUSTOM PROCEDURE TRAY) ×2 IMPLANT
NDL HYPO 25X1 1.5 SAFETY (NEEDLE) ×1 IMPLANT
NEEDLE HYPO 25X1 1.5 SAFETY (NEEDLE) ×2 IMPLANT
NS IRRIG 1000ML POUR BTL (IV SOLUTION) ×2 IMPLANT
PACK BASIC VI WITH GOWN DISP (CUSTOM PROCEDURE TRAY) ×2 IMPLANT
PENCIL BUTTON HOLSTER BLD 10FT (ELECTRODE) ×2 IMPLANT
SPONGE GAUZE 4X4 12PLY (GAUZE/BANDAGES/DRESSINGS) IMPLANT
SPONGE LAP 4X18 X RAY DECT (DISPOSABLE) ×3 IMPLANT
STAPLER VISISTAT 35W (STAPLE) IMPLANT
STRIP CLOSURE SKIN 1/2X4 (GAUZE/BANDAGES/DRESSINGS) IMPLANT
SUT MON AB 5-0 PS2 18 (SUTURE) ×1 IMPLANT
SUT PROLENE 2 0 CT2 30 (SUTURE) ×2 IMPLANT
SUT SILK 2 0 SH (SUTURE) IMPLANT
SUT SILK 2 0 SH CR/8 (SUTURE) IMPLANT
SUT SURGILON 0 BLK (SUTURE) IMPLANT
SUT VIC AB 2-0 SH 27 (SUTURE) ×2
SUT VIC AB 2-0 SH 27X BRD (SUTURE) ×1 IMPLANT
SUT VIC AB 4-0 SH 18 (SUTURE) ×2 IMPLANT
SYR 20CC LL (SYRINGE) ×2 IMPLANT
SYR BULB IRRIGATION 50ML (SYRINGE) ×2 IMPLANT
TOWEL OR 17X26 10 PK STRL BLUE (TOWEL DISPOSABLE) ×2 IMPLANT
TOWEL OR NON WOVEN STRL DISP B (DISPOSABLE) ×2 IMPLANT
YANKAUER SUCT BULB TIP 10FT TU (MISCELLANEOUS) ×2 IMPLANT

## 2012-12-08 NOTE — Anesthesia Preprocedure Evaluation (Addendum)
Anesthesia Evaluation  Patient identified by MRN, date of birth, ID band Patient awake    Reviewed: Allergy & Precautions, H&P , NPO status , Patient's Chart, lab work & pertinent test results  Airway Mallampati: II TM Distance: >3 FB Neck ROM: Full    Dental no notable dental hx.    Pulmonary sleep apnea and Continuous Positive Airway Pressure Ventilation ,  breath sounds clear to auscultation  Pulmonary exam normal       Cardiovascular + dysrhythmias Atrial Fibrillation Rhythm:Regular Rate:Normal     Neuro/Psych  Neuromuscular disease negative neurological ROS  negative psych ROS   GI/Hepatic negative GI ROS, Neg liver ROS,   Endo/Other  Morbid obesity  Renal/GU negative Renal ROS  negative genitourinary   Musculoskeletal negative musculoskeletal ROS (+)   Abdominal   Peds negative pediatric ROS (+)  Hematology negative hematology ROS (+)   Anesthesia Other Findings   Reproductive/Obstetrics negative OB ROS                          Anesthesia Physical Anesthesia Plan  ASA: III  Anesthesia Plan: General   Post-op Pain Management:    Induction: Intravenous  Airway Management Planned: Oral ETT and LMA  Additional Equipment:   Intra-op Plan:   Post-operative Plan: Extubation in OR  Informed Consent: I have reviewed the patients History and Physical, chart, labs and discussed the procedure including the risks, benefits and alternatives for the proposed anesthesia with the patient or authorized representative who has indicated his/her understanding and acceptance.   Dental advisory given  Plan Discussed with: CRNA  Anesthesia Plan Comments:         Anesthesia Quick Evaluation

## 2012-12-08 NOTE — Interval H&P Note (Signed)
History and Physical Interval Note:  12/08/2012 1:51 PM  Craig Morgan  has presented today for surgery, with the diagnosis of left inguinal neurolgia   The various methods of treatment have been discussed with the patient and family. After consideration of risks, benefits and other options for treatment, the patient has consented to  Procedure(s): open left inguinal  EXPLORATION (Left) as a surgical intervention .  The patient's history has been reviewed, patient examined, no change in status, stable for surgery.  I have reviewed the patient's chart and labs.  Questions were answered to the patient's satisfaction. He is aware that this procedure may not alleviate the pain that he has from his prior left inguinal hernia repairs.    Satonya Lux B

## 2012-12-08 NOTE — Op Note (Signed)
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  The left ingunal area was clipped and prepped with PCMX and draped sterilely and a timeout performed.  The old LIH incision was excised and dissection carried down through fatty material arriving in the vicinity of the repair where dense scar tissue abounded.  I identified the cord structures below and dissected up to the area of mesh which seemed to be forming a cicatrix around the cord.  This was freed proximally until I could insert my finger up inside to the internal ring.  There were numerous prolene sutures that were cut and removed.  Ultrapro II mesh was seen and was densely involved with the scar.  The cord structures appeared healthy and no longer incumbered by the scar.  We saw no evidence of a recurrence and felt that the scrotal pain could have been from this cord entrapment.    The wound was then closed in layers with 4-0 vicryl and 5-0 monocryl.    Matt B. Daphine Deutscher, MD, [facility], [place] [phone]

## 2012-12-08 NOTE — H&P (View-Only) (Signed)
Chief Complaint:  Asst. Left inguinal neuralgia since herniorrhaphy with mesh at the surgical center and 2009.  History of Present Illness:  Craig Morgan is an 57 y.o. male who was found to have a left inguinal hernia and referred for repair in July 2009. A left inguinal hernia was repaired in an open fashion by Dr. Toth at the surgical center [facility] using ultra Pro mesh and sewn in place with 2-0 Prolene. Since then he has noticed triggerpoints in the left inguinal region with pain. He was subsequently seen and found to have a right inguinal hernia and a recurrent left inguinal hernia which may have been just fat in his canal before that he underwent a laparoscopic bilateral inguinal hernia repair in [place].  That did not cause the pain in his left groin to go away. I discussed this with him and discussed expiration to remove the Prolene sutures anchoring the mesh and possibly remove some of the mesh to see if this would alleviate the pain. Currently he's been placed on Lyrica for the pain.  Schedule him for open exploration of the inguinal region to remove sutures and possibly mesh. According to Dr. Toth's  op note the ileal inguinal nerve was divided and ligated with silk.  Past Medical History  Diagnosis Date  . Dysrhythmia     A fib  . Asthma     hx of  . Shortness of breath     rest and exertion  . Sleep apnea     cpap  . GERD (gastroesophageal reflux disease)   . Arthritis     Back    Past Surgical History  Procedure Laterality Date  . Hernia repair      x2  . Hemorroidectomy    . Adenoidectomy    . Appendectomy    . Cardioversion  02/04/2012    Procedure: CARDIOVERSION;  Surgeon: Jagadeesh R Ganji, MD;  Location: [facility];  Service: Cardiovascular;  Laterality: N/A;    Current Outpatient Prescriptions  Medication Sig Dispense Refill  . Cholecalciferol (VITAMIN D3) 1000 UNITS CAPS Take 1 capsule by mouth daily.        . cyclobenzaprine (FLEXERIL) 10 MG tablet  Take 1 tablet at bedtime  30 tablet  11  . gabapentin (NEURONTIN) 100 MG capsule Take 100 mg by mouth 3 (three) times daily.        . Garlic 2000 MG CAPS Take 1 capsule by mouth daily.        . LYRICA 150 MG capsule       . Omega-3 Fatty Acids (FISH OIL) 1000 MG CAPS Take 1 capsule by mouth daily.        . omeprazole (PRILOSEC) 40 MG capsule Take 20 mg by mouth daily.       . propranolol ER (INDERAL LA) 160 MG SR capsule       . Rivaroxaban (XARELTO PO) Take by mouth daily.      . rOPINIRole (REQUIP) 3 MG tablet Take 6 mg by mouth at bedtime.       . Tamsulosin HCl (FLOMAX) 0.4 MG CAPS Take 0.4 mg by mouth daily.        . tiZANidine (ZANAFLEX) 4 MG tablet Take 4 mg by mouth every 6 (six) hours as needed.        No current facility-administered medications for this visit.   Penicillins Family History  Problem Relation Age of Onset  . Heart disease Father   . Heart attack Father   .   COPD Mother   . Heart attack Mother   . Cancer Maternal Grandfather     lung   Social History:   reports that he has quit smoking. He uses smokeless tobacco. He reports that he drinks about 86.4 ounces of alcohol per week. He reports that he does not use illicit drugs.   REVIEW OF SYSTEMS - PERTINENT POSITIVES ONLY: Positive for back pain with left foot drop. Worked up by [facility]. MRI showed probable left L4 nerve root compression.  Physical Exam:   Blood pressure 132/86, pulse 64, temperature 98.3 F (36.8 C), temperature source Oral, resp. rate 16, height 6' 2" (1.88 m), weight 281 lb 9.6 oz (127.733 kg). Body mass index is 36.14 kg/(m^2).  Gen:  WDWN white male NAD  Neurological: Alert and oriented to person, place, and time. Motor and sensory function is grossly intact  Head: Normocephalic and atraumatic.  Eyes: Conjunctivae are normal. Pupils are equal, round, and reactive to light. No scleral icterus.  Neck: Normal range of motion. Neck supple. No tracheal deviation or thyromegaly  present.  Cardiovascular:  SR without murmurs or gallops.  No carotid bruits Respiratory: Effort normal.  No respiratory distress. No chest wall tenderness. Breath sounds normal.  No wheezes, rales or rhonchi.  Abdomen:  nontender GU:  Tender trigger points in the bed of hernia repair. Palpable Prolene noted Musculoskeletal: Normal range of motion. Extremities are nontender. No cyanosis, edema or clubbing noted Lymphadenopathy: No cervical, preauricular, postauricular or axillary adenopathy is present Skin: Skin is warm and dry. No rash noted. No diaphoresis. No erythema. No pallor. Pscyh: Normal mood and affect. Behavior is normal. Judgment and thought content normal.   LABORATORY RESULTS: No results found for this or any previous visit (from the past 48 hour(s)).  RADIOLOGY RESULTS: No results found.  Problem List: Patient Active Problem List   Diagnosis Date Noted  . OSA on CPAP 11/18/2012  . Open LIH July 2009 [facility] 11/18/2012  . Status post laparoscopic bilateral hernia repair 2010 in [place] 11/18/2012  . Neuralgia of left inguinal region 11/18/2012  . 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 H/O atrial fibrillation: cardioversion 11/18/2012    Assessment & Plan: Left inguinal neuralgia. Plan open exploration with partial removal of mesh and/or Prolene sutures.    Matt B. Carolanne Mercier, MD, FACS  [facility] [phone] beeper [phone]  11/18/2012 11:48 AM     

## 2012-12-08 NOTE — Transfer of Care (Signed)
Immediate Anesthesia Transfer of Care Note  Patient: Craig Morgan  Procedure(s) Performed: Procedure(s): open left inguinal  EXPLORATION (Left)  Patient Location: PACU  Anesthesia Type:General  Level of Consciousness: awake, alert , oriented, patient cooperative and responds to stimulation  Airway & Oxygen Therapy: Patient Spontanous Breathing and Patient connected to face mask oxygen  Post-op Assessment: Report given to PACU RN, Post -op Vital signs reviewed and stable and Patient moving all extremities  Post vital signs: Reviewed and stable  Complications: No apparent anesthesia complications

## 2012-12-08 NOTE — Preoperative (Signed)
Beta Blockers   Reason not to administer Beta Blockers:Not Applicable  given beta blocker this a.m.

## 2012-12-08 NOTE — Anesthesia Postprocedure Evaluation (Signed)
Anesthesia Post Note  Patient: Craig Morgan  Procedure(s) Performed: Procedure(s) (LRB): open left inguinal  EXPLORATION (Left)  Anesthesia type: General  Patient location: PACU  Post pain: Pain level controlled  Post assessment: Post-op Vital signs reviewed  Last Vitals: BP 124/83  Pulse 55  Temp(Src) 36.6 C (Oral)  Resp 16  SpO2 98%  Post vital signs: Reviewed  Level of consciousness: sedated  Complications: No apparent anesthesia complications

## 2012-12-11 ENCOUNTER — Encounter (HOSPITAL_COMMUNITY): Payer: Self-pay | Admitting: Surgery

## 2013-01-01 ENCOUNTER — Encounter (INDEPENDENT_AMBULATORY_CARE_PROVIDER_SITE_OTHER): Payer: Self-pay | Admitting: Surgery

## 2013-01-01 ENCOUNTER — Ambulatory Visit (INDEPENDENT_AMBULATORY_CARE_PROVIDER_SITE_OTHER): Payer: BC Managed Care – PPO | Admitting: Surgery

## 2013-01-01 ENCOUNTER — Encounter (INDEPENDENT_AMBULATORY_CARE_PROVIDER_SITE_OTHER): Payer: Self-pay

## 2013-01-01 VITALS — BP 130/80 | HR 78 | Temp 97.3°F | Resp 16 | Ht 74.0 in | Wt 274.8 lb

## 2013-01-01 DIAGNOSIS — M792 Neuralgia and neuritis, unspecified: Secondary | ICD-10-CM

## 2013-01-01 DIAGNOSIS — G5790 Unspecified mononeuropathy of unspecified lower limb: Secondary | ICD-10-CM

## 2013-01-01 NOTE — Patient Instructions (Signed)
Thanks for your patience.  If you need further assistance after leaving the office, please call our office and speak with a [facility] nurse.  [phone].  If you want to leave a message for Dr. Angelique Chevalier, please call his office phone at [phone]. 

## 2013-01-01 NOTE — Progress Notes (Signed)
Craig Morgan 57 y.o.  Body mass index is 35.27 kg/(m^2).  Patient Active Problem List   Diagnosis Date Noted  . OSA on CPAP 11/18/2012  . Open [facility] July 2009 [facility] Carolynne Edouard 11/18/2012  . Status post laparoscopic bilateral hernia repair 2010 in [facility] 11/18/2012  . Neuralgia of left inguinal region 11/18/2012  . 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 H/O atrial fibrillation: cardioversion 11/18/2012    Allergies  Allergen Reactions  . Penicillins     REACTION: hives    Past Surgical History  Procedure Laterality Date  . Hemorroidectomy    . Adenoidectomy    . Appendectomy    . Cardioversion  02/04/2012    Procedure: CARDIOVERSION;  Surgeon: Pamella Pert, MD;  Location: [facility] ENDOSCOPY;  Service: Cardiovascular;  Laterality: N/A;  . Hernia repair      LEFT INGUINAL HERNIA;  2ND SURGERY TO DO REVISION LEFT INGUINAL HERNIA AND REPAIR RT INGUINAL HERNIA  . Eye surgery      LASIK EYE SURG X 2 LEFT EYE AND ONCE ON RT EYE  . Inguinal hernia repair Left 12/08/2012    Procedure: open left inguinal  EXPLORATION;  Surgeon: Valarie Merino, MD;  Location: [facility] ORS;  Service: General;  Laterality: Left;   FULLER,SUSAN, NP No diagnosis found.  Doing well.  Not having the sharp pains in the leg.  Still some soreness.  Incisions OK.  He will be able to go back to work in 2 weeks.  I will see him prn Matt B. Daphine Deutscher, MD, [facility] [phone] beeper [phone]  01/01/2013 11:57 AM

## 2013-05-20 ENCOUNTER — Ambulatory Visit: Payer: Self-pay

## 2013-05-20 ENCOUNTER — Ambulatory Visit: Payer: BC Managed Care – PPO | Admitting: Family Medicine

## 2013-05-20 VITALS — BP 140/98 | HR 72 | Temp 98.0°F | Resp 18

## 2013-05-20 DIAGNOSIS — M519 Unspecified thoracic, thoracolumbar and lumbosacral intervertebral disc disorder: Secondary | ICD-10-CM

## 2013-05-20 DIAGNOSIS — M549 Dorsalgia, unspecified: Secondary | ICD-10-CM

## 2013-05-20 MED ORDER — OXYCODONE-ACETAMINOPHEN 10-325 MG PO TABS: 1.0000 | ORAL_TABLET | Freq: Four times a day (QID) | ORAL | Status: DC | PRN

## 2013-05-20 MED ORDER — NALBUPHINE HCL 10 MG/ML IJ SOLN
10.0000 mg | Freq: Once | INTRAMUSCULAR | Status: AC
  Administered 2013-05-20: 10 mg via INTRAMUSCULAR

## 2013-05-20 MED ORDER — NALBUPHINE HCL 10 MG/ML IJ SOLN: 10.0000 mg | INTRAMUSCULAR | Status: DC | PRN

## 2013-05-20 NOTE — Patient Instructions (Signed)
Take MiraLax 1 dose daily as needed for bowels  Referral is being made for a MRI  Referral is being made to a back specialist  Take the generic Percocet one every 6 hours as needed for pain. Take in place of the previous pain medicine.

## 2013-05-20 NOTE — Progress Notes (Signed)
Subjective: [age] year old man who has a history of back pain problems or couple of years ago with pain radiating down his legs, especially the left. Last 6 days he has had a recurrence of pain. It started just when your sitting in his recliner one day. It radiates from the left low back all the way down to the left large toe. He says the large toe feels cold and numb. He denies any injury. 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 The pain is excruciating. He went to see another doctor 3 days ago who prescribed a tapered dose of prednisone, gave him a shot, and gave him a prescription for some Percocet 5. He says it doesn't hurt to touch the pain. An old MRI from 2 years ago indicated disc disease at the low lumbar spine with L4 possible compression  Objective: A limited his medicine bottles and it appears she is taking them appropriately. He's only used 5 of 15 pain pills.  He has tenderness in the lower lumbar and left SI area. He is visibly in a great deal of pain. Cannot sit or stand or walk straight. His left SI is very tender to percussion but the back is really not that tender to palpation. Straight leg raising is positive at about 45. Decreased sensation in his large toe area. Left heel jerk is absent compared to the right heel jerk. Knees are 1+ symmetrical The patient has terrible body odor.  Assessment: Low back pain with lumbar radiculopathy, consistent with lumbar disc disease  Plan: Nubain 10 mg IM   X-ray LS-spine  [facility] reading (PRIMARY) by  Dr. Alwyn RenHopper DJD lower lumbar spine Excessive small bowel gas   Patient says his bowels have not moved in 3 days. Will have him take some MiraLax. Will prescribe some Percocet 10.

## 2013-05-21 ENCOUNTER — Telehealth: Payer: Self-pay

## 2013-05-21 ENCOUNTER — Encounter (HOSPITAL_COMMUNITY): Payer: Self-pay | Admitting: Emergency Medicine

## 2013-05-21 ENCOUNTER — Emergency Department (HOSPITAL_COMMUNITY)
Admission: EM | Admit: 2013-05-21 | Discharge: 2013-05-21 | Disposition: A | Discharge status: Home / Self Care | Payer: BC Managed Care – PPO | Attending: Emergency Medicine | Admitting: Emergency Medicine

## 2013-05-21 DIAGNOSIS — M549 Dorsalgia, unspecified: Secondary | ICD-10-CM

## 2013-05-21 DIAGNOSIS — M129 Arthropathy, unspecified: Secondary | ICD-10-CM | POA: Insufficient documentation

## 2013-05-21 DIAGNOSIS — M545 Low back pain, unspecified: Secondary | ICD-10-CM | POA: Insufficient documentation

## 2013-05-21 DIAGNOSIS — Z8679 Personal history of other diseases of the circulatory system: Secondary | ICD-10-CM | POA: Insufficient documentation

## 2013-05-21 DIAGNOSIS — Z87891 Personal history of nicotine dependence: Secondary | ICD-10-CM | POA: Insufficient documentation

## 2013-05-21 DIAGNOSIS — Z7982 Long term (current) use of aspirin: Secondary | ICD-10-CM | POA: Insufficient documentation

## 2013-05-21 DIAGNOSIS — G4730 Sleep apnea, unspecified: Secondary | ICD-10-CM | POA: Insufficient documentation

## 2013-05-21 DIAGNOSIS — Z79899 Other long term (current) drug therapy: Secondary | ICD-10-CM | POA: Insufficient documentation

## 2013-05-21 DIAGNOSIS — IMO0002 Reserved for concepts with insufficient information to code with codable children: Secondary | ICD-10-CM | POA: Insufficient documentation

## 2013-05-21 DIAGNOSIS — K219 Gastro-esophageal reflux disease without esophagitis: Secondary | ICD-10-CM | POA: Insufficient documentation

## 2013-05-21 DIAGNOSIS — M519 Unspecified thoracic, thoracolumbar and lumbosacral intervertebral disc disorder: Secondary | ICD-10-CM

## 2013-05-21 DIAGNOSIS — Z87448 Personal history of other diseases of urinary system: Secondary | ICD-10-CM | POA: Insufficient documentation

## 2013-05-21 DIAGNOSIS — Z880 Allergy status to penicillin: Secondary | ICD-10-CM | POA: Insufficient documentation

## 2013-05-21 MED ORDER — FENTANYL CITRATE 0.05 MG/ML IJ SOLN
50.0000 ug | Freq: Once | INTRAMUSCULAR | Status: AC
  Administered 2013-05-21: 50 ug via INTRAVENOUS
  Filled 2013-05-21: qty 2

## 2013-05-21 MED ORDER — OXYCODONE-ACETAMINOPHEN 10-325 MG PO TABS: 1.0000 | ORAL_TABLET | Freq: Four times a day (QID) | ORAL | Status: DC | PRN

## 2013-05-21 MED ORDER — DIAZEPAM 5 MG PO TABS: 5.0000 mg | ORAL_TABLET | Freq: Two times a day (BID) | ORAL | Status: DC

## 2013-05-21 MED ORDER — DIAZEPAM 5 MG PO TABS
5.0000 mg | ORAL_TABLET | Freq: Once | ORAL | Status: AC
  Administered 2013-05-21: 5 mg via ORAL
  Filled 2013-05-21: qty 1

## 2013-05-21 MED ORDER — HYDROMORPHONE HCL PF 1 MG/ML IJ SOLN
1.0000 mg | Freq: Once | INTRAMUSCULAR | Status: AC
  Administered 2013-05-21: 1 mg via INTRAVENOUS
  Filled 2013-05-21: qty 1

## 2013-05-21 NOTE — ED Provider Notes (Signed)
CSN: [id number]     Arrival date & time 05/21/13  1837 History   First MD Initiated Contact with Patient 05/21/13 2020     Chief Complaint  Patient presents with  . Back Pain     (Consider location/radiation/quality/duration/timing/severity/associated sxs/prior Treatment) HPI Comments: Patient presents emergency department with chief complaint of back pain. He states that his pain has been ongoing for the past couple of weeks. He states that it is mostly on the left side of his back, and radiates to his left leg. He denies any bowel or bladder incontinence. Denies any saddle anesthesia. He is been seen by his primary care provider as well as by urgent care, and has tried taking prednisone and a muscle relaxer. He denies any fevers or chills. Denies any ataxia.  The history is provided by the patient. No language interpreter was used.    Past Medical History  Diagnosis Date  . Dysrhythmia     A fib  . Sleep apnea     cpap  . GERD (gastroesophageal reflux disease)   . Arthritis     Back  . Shortness of breath     rest and exertion-WHEN PT WAS HAVING AF  . Pain     LEFT TESTICLE AND CRAMPS IN LEFT UPPER THIGH AND ALSO RIGHT ; FEELS LIKE NEEDLES IN LEGS--LYRICA HAS HELPED  . Numbness     RIGHT LEG - KNEE TO ANKLE - STATES HE WAS GIVEN INJECTION ONCE FOR SCIATIC PROBLEM AND THE NUMBNESS BEGAN AFTER THE INJECTION.   Past Surgical History  Procedure Laterality Date  . Hemorroidectomy    . Adenoidectomy    . Appendectomy    . Cardioversion  02/04/2012    Procedure: CARDIOVERSION;  Surgeon: Pamella PertJagadeesh R Ganji, MD;  [facility];  Service: Cardiovascular;  Laterality: N/A;  . Hernia repair      LEFT INGUINAL HERNIA;  2ND SURGERY TO DO REVISION LEFT INGUINAL HERNIA AND REPAIR RT INGUINAL HERNIA  . Eye surgery      LASIK EYE SURG X 2 LEFT EYE AND ONCE ON RT EYE  . Inguinal hernia repair Left 12/08/2012    Procedure: open left inguinal  EXPLORATION;  Surgeon: Valarie MerinoMatthew B Martin,  MD;  Location: [facility];  Service: General;  Laterality: Left;   Family History  Problem Relation Age of Onset  . Heart disease Father   . Heart attack Father   . COPD Mother   . Heart attack Mother   . Cancer Maternal Grandfather     lung   History  Substance Use Topics  . Smoking status: Former Games developermoker  . Smokeless tobacco: Current User    Types: Snuff  . Alcohol Use: 0.0 oz/week     Comment: 2 OR 3 BeerS DAILY -  QUIT SMOKING APPROX AGE 58    Review of Systems  Constitutional: Negative for fever and chills.  Gastrointestinal:       No bowel incontinence  Genitourinary:       No urinary incontinence  Musculoskeletal: Positive for arthralgias, back pain and myalgias.  Neurological:       No saddle anesthesia      Allergies  Penicillins  Home Medications   Current Outpatient Rx  Name  Route  Sig  Dispense  Refill  . aspirin 325 MG tablet   Oral   Take 325 mg by mouth daily.         . Garlic 2000 MG CAPS   Oral   Take 1  capsule by mouth daily.           . Omega-3 Fatty Acids (FISH OIL) 1000 MG CAPS   Oral   Take 1 capsule by mouth daily.           Marland Kitchen omeprazole (PRILOSEC) 20 MG capsule   Oral   Take 20 mg by mouth daily.         Marland Kitchen oxyCODONE (OXY IR/ROXICODONE) 5 MG immediate release tablet   Oral   Take 5 mg by mouth every 4 (four) hours as needed for severe pain.         Marland Kitchen oxyCODONE-acetaminophen (PERCOCET) 10-325 MG per tablet   Oral   Take 1 tablet by mouth every 6 (six) hours as needed for pain.   20 tablet   0   . predniSONE (DELTASONE) 10 MG tablet   Oral   Take 10 mg by mouth daily with breakfast. Taper decrease by 1/2 a tab daily. Currenty at 4 tabs         . rOPINIRole (REQUIP) 4 MG tablet   Oral   Take 4 mg by mouth.         . diazepam (VALIUM) 5 MG tablet   Oral   Take 1 tablet (5 mg total) by mouth 2 (two) times daily.   10 tablet   0   . LYRICA 150 MG capsule   Oral   Take 150 mg by mouth at bedtime.           . Vitamin D, Ergocalciferol, (DRISDOL) 50000 UNITS CAPS capsule      every 7 (seven) days.           BP 141/79  Pulse 79  Resp 16  SpO2 99% Physical Exam  Nursing note and vitals reviewed. Constitutional: He is oriented to person, place, and time. He appears well-developed and well-nourished. No distress.  HENT:  Head: Normocephalic and atraumatic.  Eyes: Conjunctivae and EOM are normal. Right eye exhibits no discharge. Left eye exhibits no discharge. No scleral icterus.  Neck: Normal range of motion. Neck supple. No tracheal deviation present.  Cardiovascular: Normal rate, regular rhythm and normal heart sounds.  Exam reveals no gallop and no friction rub.   No murmur heard. Pulmonary/Chest: Effort normal and breath sounds normal. No respiratory distress. He has no wheezes.  Abdominal: Soft. He exhibits no distension. There is no tenderness.  Musculoskeletal: Normal range of motion.  Lumbar paraspinal muscles tender to palpation, no bony tenderness, step-offs, or gross abnormality or deformity of spine, patient is able to ambulate, moves all extremities  Bilateral great toe extension intact Bilateral plantar/dorsiflexion intact  Neurological: He is alert and oriented to person, place, and time. He has normal reflexes.  Sensation and strength intact bilaterally Symmetrical reflexes  Skin: Skin is warm. He is not diaphoretic.  Psychiatric: He has a normal mood and affect. His behavior is normal. Judgment and thought content normal.    ED Course  Procedures (including critical care time) Labs Review Labs Reviewed - No data to display Imaging Review Dg Lumbar Spine Complete  05/20/2013   CLINICAL DATA:  Left-sided low back pain radiating to the left leg.  EXAM: LUMBAR SPINE - COMPLETE 4+ VIEW  COMPARISON:  06/17/2011  FINDINGS: No fracture. There is a grade 1 anterolisthesis of L4 on L5. There is mild to moderate loss of disc height at L4-L5.  No other spondylolisthesis. There  is minor loss of disc height at L2-L3. The remaining lumbar disc  spaces are well preserved.  There are facet degenerative changes at L4-L5 and L5-S1, greater on the left.  Generalized increased bowel gas is noted. The soft tissues are unremarkable.  IMPRESSION: 1. No fracture or acute finding. 2. Degenerative changes as described including a grade 1 anterolisthesis of L4 on L5. These findings are stable from the prior study.   Electronically Signed   By: Amie Portland M.D.   On: 05/20/2013 14:27     EKG Interpretation None      MDM   Final diagnoses:  Back pain    Patient with back pain.  No neurological deficits and normal neuro exam.  Patient can walk but states is painful.  No loss of bowel or bladder control.  No concern for cauda equina.  No fever, night sweats, weight loss, h/o cancer, IVDU.  RICE protocol and pain medicine indicated and discussed with patient.      Roxy Horseman, PA-C 05/21/13 2154

## 2013-05-21 NOTE — ED Provider Notes (Signed)
Medical screening examination/treatment/procedure(s) were performed by non-physician practitioner and as supervising physician I was immediately available for consultation/collaboration.   EKG Interpretation None        Richardean Canalavid H Dyanne Yorks, MD 05/21/13 2350

## 2013-05-21 NOTE — ED Notes (Addendum)
Per EMS-states lower back pain for Friday-saw 2 different MDs for sciatica and received meds but increased pain today-unable to walk-100 mcg of fentanyl given in route

## 2013-05-21 NOTE — Telephone Encounter (Signed)
Patient states he did NOT receive a script for his oxyCODONE-acetaminophen (PERCOCET) 10-325 MG per tablet Yesterday, by Dr. Alwyn RenHopper  His son Katherine MantleColt Butler or Daughter Christiana FuchsSherry Edens can pick it up for him.  He is in too much pain to go out again.   [phone]

## 2013-05-21 NOTE — Telephone Encounter (Signed)
Pt came into office and rx was given to pt

## 2013-05-21 NOTE — Discharge Instructions (Signed)
Do not mix the muscle relaxers. Continue taking your other medicines as prescribed.  Back Pain, Adult Low back pain is very common. About 1 in 5 people have back pain.The cause of low back pain is rarely dangerous. The pain often gets better over time.About half of people with a sudden onset of back pain feel better in just 2 weeks. About 8 in 10 people feel better by 6 weeks.  CAUSES Some common causes of back pain include:  Strain of the muscles or ligaments supporting the spine.  Wear and tear (degeneration) of the spinal discs.  Arthritis.  Direct injury to the back. DIAGNOSIS Most of the time, the direct cause of low back pain is not known.However, back pain can be treated effectively even when the exact cause of the pain is unknown.Answering your caregiver's questions about your overall health and symptoms is one of the most accurate ways to make sure the cause of your pain is not dangerous. If your caregiver needs more information, he or she may order lab work or imaging tests (X-rays or MRIs).However, even if imaging tests show changes in your back, this usually does not require surgery. HOME CARE INSTRUCTIONS For many people, back pain returns.Since low back pain is rarely dangerous, it is often a condition that people can learn to [facility] their own.   Remain active. It is stressful on the back to sit or stand in one place. Do not sit, drive, or stand in one place for more than 30 minutes at a time. Take short walks on level surfaces as soon as pain allows.Try to increase the length of time you walk each day.  Do not stay in bed.Resting more than 1 or 2 days can delay your recovery.  Do not avoid exercise or work.Your body is made to move.It is not dangerous to be active, even though your back may hurt.Your back will likely heal faster if you return to being active before your pain is gone.  Pay attention to your body when you bend and lift. Many people have less  discomfortwhen lifting if they bend their knees, keep the load close to their bodies,and avoid twisting. Often, the most comfortable positions are those that put less stress on your recovering back.  Find a comfortable position to sleep. Use a firm mattress and lie on your side with your knees slightly bent. If you lie on your back, put a pillow under your knees.  Only take over-the-counter or prescription medicines as directed by your caregiver. Over-the-counter medicines to reduce pain and inflammation are often the most helpful.Your caregiver may prescribe muscle relaxant drugs.These medicines help dull your pain so you can more quickly return to your normal activities and healthy exercise.  Put ice on the injured area.  Put ice in a plastic bag.  Place a towel between your skin and the bag.  Leave the ice on for 15-20 minutes, 03-04 times a day for the first 2 to 3 days. After that, ice and heat may be alternated to reduce pain and spasms.  Ask your caregiver about trying back exercises and gentle massage. This may be of some benefit.  Avoid feeling anxious or stressed.Stress increases muscle tension and can worsen back pain.It is important to recognize when you are anxious or stressed and learn ways to manage it.Exercise is a great option. SEEK MEDICAL CARE IF:  You have pain that is not relieved with rest or medicine.  You have pain that does not improve in 1  week.  You have new symptoms.  You are generally not feeling well. SEEK IMMEDIATE MEDICAL CARE IF:   You have pain that radiates from your back into your legs.  You develop new bowel or bladder control problems.  You have unusual weakness or numbness in your arms or legs.  You develop nausea or vomiting.  You develop abdominal pain.  You feel faint. Document Released: 03/11/2005 Document Revised: 09/10/2011 Document Reviewed: 07/30/2010 [facility] Patient Information 2014 [place], [place]. Sciatica Sciatica is  pain, weakness, numbness, or tingling along the path of the sciatic nerve. The nerve starts in the lower back and runs down the back of each leg. The nerve controls the muscles in the lower leg and in the back of the knee, while also providing sensation to the back of the thigh, lower leg, and the sole of your foot. Sciatica is a symptom of another medical condition. For instance, nerve damage or certain conditions, such as a herniated disk or bone spur on the spine, pinch or put pressure on the sciatic nerve. This causes the pain, weakness, or other sensations normally associated with sciatica. Generally, sciatica only affects one side of the body. CAUSES   Herniated or slipped disc.  Degenerative disk disease.  A pain disorder involving the narrow muscle in the buttocks (piriformis syndrome).  Pelvic injury or fracture.  Pregnancy.  Tumor (rare). SYMPTOMS  Symptoms can vary from mild to very severe. The symptoms usually travel from the low back to the buttocks and down the back of the leg. Symptoms can include:  Mild tingling or dull aches in the lower back, leg, or hip.  Numbness in the back of the calf or sole of the foot.  Burning sensations in the lower back, leg, or hip.  Sharp pains in the lower back, leg, or hip.  Leg weakness.  Severe back pain inhibiting movement. These symptoms may get worse with coughing, sneezing, laughing, or prolonged sitting or standing. Also, being overweight may worsen symptoms. DIAGNOSIS  Your caregiver will perform a physical exam to look for common symptoms of sciatica. [name] may ask you to do certain movements or activities that would trigger sciatic nerve pain. Other tests may be performed to find the cause of the sciatica. These may include:  Blood tests.  X-rays.  Imaging tests, such as an MRI or CT scan. TREATMENT  Treatment is directed at the cause of the sciatic pain. Sometimes, treatment is not necessary and the pain and  discomfort goes away on its own. If treatment is needed, your caregiver may suggest:  Over-the-counter medicines to relieve pain.  Prescription medicines, such as anti-inflammatory medicine, muscle relaxants, or narcotics.  Applying heat or ice to the painful area.  Steroid injections to lessen pain, irritation, and inflammation around the nerve.  Reducing activity during periods of pain.  Exercising and stretching to strengthen your abdomen and improve flexibility of your spine. Your caregiver may suggest losing weight if the extra weight makes the back pain worse.  Physical therapy.  Surgery to eliminate what is pressing or pinching the nerve, such as a bone spur or part of a herniated disk. HOME CARE INSTRUCTIONS   Only take over-the-counter or prescription medicines for pain or discomfort as directed by your caregiver.  Apply ice to the affected area for 20 minutes, 3 4 times a day for the first 48 72 hours. Then try heat in the same way.  Exercise, stretch, or perform your usual activities if these do  not aggravate your pain.  Attend physical therapy sessions as directed by your caregiver.  Keep all follow-up appointments as directed by your caregiver.  Do not wear high heels or shoes that do not provide proper support.  Check your mattress to see if it is too soft. A firm mattress may lessen your pain and discomfort. SEEK IMMEDIATE MEDICAL CARE IF:   You lose control of your bowel or bladder (incontinence).  You have increasing weakness in the lower back, pelvis, buttocks, or legs.  You have redness or swelling of your back.  You have a burning sensation when you urinate.  You have pain that gets worse when you lie down or awakens you at night.  Your pain is worse than you have experienced in the past.  Your pain is lasting longer than 4 weeks.  You are suddenly losing weight without reason. MAKE SURE YOU:  Understand these instructions.  Will watch your  condition.  Will get help right away if you are not doing well or get worse. Document Released: 03/05/2001 Document Revised: 09/10/2011 Document Reviewed: 07/21/2011 [facility] Patient Information 2014 [place], [place].

## 2013-08-18 ENCOUNTER — Telehealth (INDEPENDENT_AMBULATORY_CARE_PROVIDER_SITE_OTHER): Payer: Self-pay

## 2013-08-18 ENCOUNTER — Other Ambulatory Visit (INDEPENDENT_AMBULATORY_CARE_PROVIDER_SITE_OTHER): Payer: Self-pay

## 2013-08-18 DIAGNOSIS — M5430 Sciatica, unspecified side: Secondary | ICD-10-CM

## 2013-08-18 NOTE — Telephone Encounter (Signed)
Follow up call to Craig Morgan- he reports he's feeling okay today.  Made him aware that I have submitted his referral to neurology per Dr. Daphine Deutscher and our office will call to get appointment scheduled after August 23, 2013 due to insurance per patient's request

## 2013-08-18 NOTE — Telephone Encounter (Deleted)
Message copied by Maryan Puls on Wed Aug 18, 2013  2:43 PM ------      Message from: Luretha Murphy B      Created: Wed Aug 18, 2013  7:25 AM      Regarding: Appointment with neurosurgeons for back pain       Filomeno Frilot is having severe left leg sciatic nerve pain. Could you call and check on him.  He will have insurance after June 1.  Could you try to get him an appointment with the neurosurgeons (first available) to assess his back pain that is referred down his left leg. I had sent Lauralyn Primes an epic message about this referral over the weekend but if he is like me he probably opens his mail once a month!      Thanks ------

## 2013-08-25 ENCOUNTER — Ambulatory Visit (INDEPENDENT_AMBULATORY_CARE_PROVIDER_SITE_OTHER): Payer: 59 | Admitting: Family Medicine

## 2013-08-25 VITALS — BP 128/86 | HR 72 | Temp 98.4°F | Resp 18 | Ht 72.0 in | Wt 273.4 lb

## 2013-08-25 DIAGNOSIS — Z024 Encounter for examination for driving license: Secondary | ICD-10-CM

## 2013-08-25 DIAGNOSIS — G4733 Obstructive sleep apnea (adult) (pediatric): Secondary | ICD-10-CM

## 2013-08-25 DIAGNOSIS — Z0289 Encounter for other administrative examinations: Secondary | ICD-10-CM

## 2013-08-25 NOTE — Progress Notes (Signed)
DOT physical examination  History: 58 year old man who is here for his DOT exam. 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  Past medical history: Medical illnesses: Has a history of having atrial fibrillation which ended up being cardioverted. No symptoms and no further problems from that. Has a history of obstructive sleep apnea and wears a CPAP regularly. Surgeries: He has had hernia repairs, with a problem with left medial thigh cramping ever since that. 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 He plans to see a back person syndrome for that. He has had Lasix surgery, appendectomy, adenoidectomy. Regular medications: Takes aspirin omeprazole, Requip, and garlic Allergies: Penicillin  Social: Single, runs a trucking company  Review of systems: HEENT unremarkable Cardiovascular: Unremarkable Respiratory: Unremarkable  GI: Has a history of reflux GU: Unremarkable The skeletal: Swelling of his left ankle and numbness of the left 4 smaller toes Dermatologic unremarkable Neurologic neuropathy as noted Psychiatric: Unremarkable Endocrine: Unremarkable  Physical exam: Weight white male in no acute distress. TMs normal. Eyes PERRLA. Fairly constricted. Fundi appear okay. Throat clear. Neck supple without nodes thyromegaly. No carotid bruits. Chest is clear to auscultation. Heart regular without murmurs. And soft masses or tenderness. Normal male external genitalia with no hernias. Extremities unremarkable. Skin unremarkable. Mild swelling of left ankle. Pulse is adequate.  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

## 2013-08-25 NOTE — Patient Instructions (Signed)
Bring the report back regarding your sleep machine usage

## 2013-09-09 ENCOUNTER — Telehealth: Payer: Self-pay

## 2013-09-09 NOTE — Telephone Encounter (Signed)
We got a report for this pt regarding his OSA and his DOT clearance. Per Dr. Alwyn RenHopper, the report still appears inadequate and to call pt to see if he can RTC to discuss. Left message on machine to call back. Report sent to scan.

## 2013-09-13 NOTE — Telephone Encounter (Signed)
Noted.  I will be in office 6/23.

## 2013-09-13 NOTE — Telephone Encounter (Signed)
Pt has to take the machine with him in and out of the truck. He states that the transmitter was knocked off the back of the machine. The office called and told him it was not transmitting the information. He will be in tomorrow to see Dr. Alwyn RenHopper.

## 2013-09-14 ENCOUNTER — Ambulatory Visit (INDEPENDENT_AMBULATORY_CARE_PROVIDER_SITE_OTHER): Payer: 59 | Admitting: Family Medicine

## 2013-09-14 DIAGNOSIS — Z024 Encounter for examination for driving license: Secondary | ICD-10-CM

## 2013-09-14 DIAGNOSIS — G4733 Obstructive sleep apnea (adult) (pediatric): Secondary | ICD-10-CM

## 2013-09-14 DIAGNOSIS — Z0289 Encounter for other administrative examinations: Secondary | ICD-10-CM

## 2013-09-14 NOTE — Progress Notes (Signed)
Patient got he is hard pretty from his CPAP machine. I saw that report and he is going to be scanned I believe, for we cannot find this morning. He only was documented using it about 70% the time, but apparently moving it back and forth from home in the truck he has knocked the cart out and therefore had the decreased reading. He feels better when he uses it. We discussed this further on and decided to give him a card for 3 months, recording him to bring a updated report in at that time.  Assessment: Sleep apnea syndrome DOT  Plan: Three-month card. No charge today since this was completion of his physical a few days ago. but he will have to have a visit in 3 months again.

## 2013-09-29 DIAGNOSIS — M5440 Lumbago with sciatica, unspecified side: Secondary | ICD-10-CM | POA: Insufficient documentation

## 2013-11-18 ENCOUNTER — Encounter: Payer: Self-pay | Admitting: Gastroenterology

## 2014-02-01 ENCOUNTER — Telehealth: Payer: Self-pay | Admitting: Family Medicine

## 2014-02-01 NOTE — Telephone Encounter (Signed)
For your information  

## 2014-02-01 NOTE — Telephone Encounter (Signed)
Patient was seen on 08/25/2013 for a DOT exam. Patient dropped off his CPAP results for Dr. Alwyn RenHopper to review so that he can get a new card. These have been placed in Dr. Frederik PearHopper's box. Please call patient when DOT card is ready for pick up.  [phone]

## 2014-05-23 ENCOUNTER — Ambulatory Visit
Admission: RE | Admit: 2014-05-23 | Discharge: 2014-05-23 | Disposition: A | Discharge status: Home / Self Care | Payer: 59 | Source: Ambulatory Visit | Attending: Nurse Practitioner | Admitting: Nurse Practitioner

## 2014-05-23 ENCOUNTER — Other Ambulatory Visit: Payer: Self-pay | Admitting: Nurse Practitioner

## 2014-05-23 DIAGNOSIS — R609 Edema, unspecified: Secondary | ICD-10-CM

## 2015-03-15 ENCOUNTER — Ambulatory Visit (INDEPENDENT_AMBULATORY_CARE_PROVIDER_SITE_OTHER): Payer: Self-pay | Admitting: Family Medicine

## 2015-03-15 VITALS — BP 114/76 | HR 104 | Temp 98.2°F | Resp 18 | Ht 72.75 in | Wt 290.6 lb

## 2015-03-15 DIAGNOSIS — Z021 Encounter for pre-employment examination: Secondary | ICD-10-CM

## 2015-03-15 DIAGNOSIS — Z024 Encounter for examination for driving license: Secondary | ICD-10-CM

## 2015-03-15 DIAGNOSIS — G4733 Obstructive sleep apnea (adult) (pediatric): Secondary | ICD-10-CM

## 2015-03-15 NOTE — Progress Notes (Signed)
Patient ID: Craig Morgan, male    DOB: [birth year]  Age: 59 y.o. MRN: [id number]  Chief Complaint  Patient presents with  . Employment Physical    DOT     Subjective:   59 year old man here for his DOT physical examination. He feels well with no major complaints. 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 He feels well. He is not aware of any heart problems.  Past medical history Some years ago he had a defibrillation and had to be cardioverted. 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 Energy level is good. He does not need much sleep. Cardiovascular: Unremarkable Respiratory: Unremarkable GI: Unremarkable GU: Unremarkable Musculoskeletal: Unremarkable Neurologic: Unremarkable Dermatologic: Unremarkable Psychiatric: Unremarkable  Physical exam: A little bit overweight, no major acute distress. TMs are normal. Eyes PERRLA. Throat clear. Neck supple without nodes. Chest clear. Heart regular without murmurs. Does have fairly frequent ectopy. Abdomen soft without mass or tenderness. Normal male external genitalia. No hernias. Extremities unremarkable. Has a little bit of edema in his ankles.  Assessment: DOT exam History of obstructive sleep apnea History of atrial fibrillation, converted Arrhythmias: Nocturnal leg jerking  Plan: We'll make him repeat the sleep apnea CPAP use after letting him know the low on this. We'll see him back in a couple of months. I think he needs to see a cardiologist again because he is having some ectopy. Current allergies, medications, problem list, past/family and social histories  reviewed.  Objective:  BP 114/76 mmHg  Pulse 104  Temp(Src) 98.2 F (36.8 C) (Oral)  Resp 18  Ht 6' 0.75" (1.848 m)  Wt 290 lb 9.6 oz (131.815 kg)  BMI 38.60 kg/m2  SpO2 98%  See above  Assessment & Plan:   Assessment: 1. Driver's permit physical examination   2. OSA (obstructive sleep apnea)       Plan:   Patient Instructions  Contact your cardiologist, Dr. Yates DecampJay Morgan, for recheck because of some irregular heartbeats  Use your CPAP regularly. You need to be able to show that you have used it at least 4 hours and 70% of days. Bringing back another machine download in 2-3 months.  I'm giving her 90 day card. When you return bringing back a report from Dr. Jacinto Morgan regarding your heart and a repeat printout regarding the CPAP usage.  You may want to consider changing from the nasal prongs to a mask and see if you will keep it on better     No Follow-up on file.   Craig Kappes, MD 03/15/2015

## 2015-03-15 NOTE — Patient Instructions (Signed)
Contact your cardiologist, [name], for recheck because of some irregular heartbeats  Use your CPAP regularly. You need to be able to show that you have used it at least 4 hours and 70% of days. Bringing back another machine download in 2-3 months.  I'm giving her 90 day card. When you return bringing back a report from [name] regarding your heart and a repeat printout regarding the CPAP usage.  You may want to consider changing from the nasal prongs to a mask and see if you will keep it on better

## 2015-05-15 NOTE — H&P (Signed)
OFFICE VISIT NOTES COPIED TO EPIC FOR DOCUMENTATION  . Diagnostic Studies History (April Garrison; 2015-05-31 2:08 PM) Echocardiogram02/10/2015 Left ventricle cavity is normal in size. Mild to moderate concentric hypertrophy of the left ventricle. Mild decrease in global wall motion. Left ventricle regional wall motion findings: No wall motion abnormalities. Unable to evaluate diastolic function due to A. Fibrillation. Calculated EF 50%. Left atrial cavity is severely dilated. Mild mitral regurgitation. Trace tricuspid regurgitation. Unable to estimate PA pressure due to absence/minimal TR signal. The aortic root is dilated. IVC is dilated with respiratory variation. This suggests elevated right heart pressure. Compared to the study done on 01/06/2012, no significant change. LA was mildly dilated Echo 5.24.12 Normal. Cardiac tests EKG 10/08/10 at the time of syncope and dizziness showed A. Flutter with 3:1 Conduction. He wore a event monitor for a month and unfortunately or fortunately did not have any more "spells" and essentially documented normal sinus rhythm. Final event report 9/28-10/27/12: NSR. No further A. Fibrillation. No events reported. Echo 5.24.12 Normal. Lexiscan myoview 5.31.12 Diaphragmatic attenuation withour ischemia. Normal LVEF. Labwork 05/03/2015: Total cholesterol 155, triglycerides 137, HDL 35, LDL 93, TSH 1.88, CBC normal, creatinine 0.89, potassium 4.3, CMP normal 12/23/2011: total cholesterol 183, triglycerides 113, HDL 42, LDL 118. LDL particle #1167. Blood glucose is elevated at 113 mg. LP-IR score was 78 suggestive of insulin resistance. CMP, CBC was normal with very minimally abnormal MCV at 100 and MCH at 35.1. HbA1c was 5.5% normal. TSH was normal. Lexiscan myoview 5.31.12 Diaphragmatic attenuation withour ischemia. Normal LVEF. Final event report 9/28-10/27/12: NSR. No further A. Fibrillation. No events reported. Colonoscopy2010  Other Problems (April Rinaldo Ratel;  05-31-15 2:08 PM) Unspecified Diagnosis    Review of Systems Pamella Pert, MD; 05-31-15 6:32 PM) General Present- Feeling well and Obesity. Not Present- Anorexia, Appetite Loss, Fatigue, Night Sweats and Weight Gain. Skin Not Present- Bruising and Cold Skin. HEENT Present- Sleep Apnea (On CPAP, w/ semicompliance) and Snoring. Not Present- Blurred Vision, Headache, Visual Disturbances and Visual Loss. Respiratory Present- Snoring (Has sleep apnea and on CPAP. Semicompliant). Not Present- Difficulty Breathing on Exertion and Dyspnea. Cardiovascular Present- Difficulty Breathing On Exertion. Not Present- Edema, Hypertension and Irregular Heart Beat. Gastrointestinal Not Present- Abdominal Pain, Black, Tarry Stool and Bloody Stool. Musculoskeletal Present- Back Pain (Lumbar region w/ associated leg pain), Leg Cramps (worse at night) and Swelling of Extremities (bilateral feet). Not Present- Arm Weakness, Calf Pain and Claudication. Note: Sleep apnea present.   Neurological Present- Paresthesias (Bilateral feet; Left>right). Not Present- Headaches, Stroke, Syncope and Weakness In Extremities. Psychiatric Present- Change in Sleep Pattern. Endocrine Not Present- Appetite Changes, Cold Intolerance, Excessive Thirst, Hair Changes and Heat Intolerance. All other systems negative  Vitals Pamella Pert MD; 2015-05-31 2:55 PM) 05-31-2015 2:10 PM Weight: 291 lb Height: 74in Body Surface Area: 2.55 m Body Mass Index: 37.36 kg/m  Pulse: 94 (Irregular)  P.OX: 98% (Room air) BP: 120/77 (Sitting, Left Arm, Standard)       Physical Exam Pamella Pert, MD; May 31, 2015 6:32 PM) General Mental Status-Alert. General Appearance-Cooperative and Appears stated age. Build & Nutrition-Well built.  Head and Neck Thyroid Gland Characteristics - normal size and consistency and no palpable nodules.  Chest and Lung Exam Chest and lung exam reveals -quiet, even and easy  respiratory effort with no use of accessory muscles, non-tender and on auscultation, normal breath sounds, no adventitious sounds.  Cardiovascular Cardiovascular examination reveals -carotid auscultation reveals no bruits, abdominal aorta auscultation reveals no bruits and no prominent  pulsation and femoral artery auscultation bilaterally reveals normal pulses, no bruits, no thrills. Auscultation Rhythm - Irregularly irregular.  Abdomen Palpation/Percussion Normal exam - Non Tender and No hepatosplenomegaly.  Peripheral Vascular Lower Extremity Palpation - Edema - Bilateral - 1+ Pitting edema. Dorsalis pedis pulse - Bilateral - Feeble. Posterior tibial pulse - Bilateral - Normal.  Neurologic Neurologic evaluation reveals -alert and oriented x 3 with no impairment of recent or remote memory. Motor-Grossly intact without any focal deficits.  Musculoskeletal Global Assessment Left Lower Extremity - no deformities, masses or tenderness, no known fractures. Right Lower Extremity - no deformities, masses or tenderness, no known fractures.  Assessment & Plan Pamella Pert MD; 05/11/2015 3:42 PM)  Paroxysmal atrial fibrillation (I48.0) Story: CHA2DS2-VASc Score is 0 with yearly risk of stroke of 0%. HAS-Bled score is 0 and estimated major bleeding in one year is 0.6-1.13%  Echocardiogram 05/03/2015: Left ventricle cavity is normal in size. Mild to moderate concentric hypertrophy of the left ventricle. Mild decrease in global wall motion. Left ventricle regional wall motion findings: No wall motion abnormalities. Unable to evaluate diastolic function due to A. Fibrillation. Calculated EF 50%. Left atrial cavity is severely dilated. Mild mitral regurgitation. Trace tricuspid regurgitation. Unable to estimate PA pressure due to absence/minimal TR signal. The aortic root is dilated. IVC is dilated with respiratory variation. This suggests elevated right heart pressure. Compared to  the study done on 01/06/2012, no significant change. LA was mildly dilated.  Final event report 9/28-10/27/12: NSR. No further A. Fibrillation. No events reported.  Echo 5.24.12 Normal.  Lexiscan myoview 5.31.12 Diaphragmatic attenuation withour ischemia. Normal LVEF. Impression: EKG 05/11/2015: Atrial flutter ablation with controlled response at the rate of 87 bpm, normal axis, no evidence of ischemia, normal QT interval. No significant change from EKG 04/18/2014.  EKG 02/13/2012: Normal sinus rhythm at rate of 67 bpm, normal intervals, normal EKG. No ischemia.  EKG 10/08/10 at the time of syncope and dizziness showed A. Flutter with 3:1 Conduction. Current Plans Complete electrocardiogram (93000) Started Flecainide Acetate , 1 (one) Tablet two times daily, #60, 30 days starting 05/11/2015, Ref. x2. METABOLIC PANEL, BASIC (78295) Pure hypercholesterolemia (E78.00) Labwork Story: 05/03/2015: Total cholesterol 155, triglycerides 137, HDL 35, LDL 93, TSH 1.88, CBC normal, creatinine 0.89, potassium 4.3, CMP normal  12/23/2011: total cholesterol 183, triglycerides 113, HDL 42, LDL 118. LDL particle #1167. Blood glucose is elevated at 113 mg. LP-IR score was 78 suggestive of insulin resistance. CMP, CBC was normal with very minimally abnormal MCV at 100 and MCH at 35.1. HbA1c was 5.5% normal. TSH was normal.  Sleep apnea, obstructive (G47.33) Story: On CPAP and tolerating started 12/2011.  Per Patsy Lager, Mr Maraj's CPAP was ordered by Dr Vickey Huger in 2013. However, he's been inactive w/[facility] since Sept 2015.  Current Plans Mechanism of underlying disease process and action of medications discussed with the patient. I discussed primary/secondary prevention and also dietary counceling was done. Patient is here on a one month office visit and follow-up of atrial fibrillation. Although asymptomatic, he had maintained sinus rhythm for a while, I would like to try cardioversion again. I started him  on flecainide due to left atrium being markedly enlarged. I have discussed the risks and benefits of cardioversion with the patient. I'll refer him back for sleep evaluation to Dr. Porfirio Mylar Dohmeier. Office visit after the cardioversion. Labs are discussed, all within normal limits. Schedule for Direct current cardioversion. I have discussed regarding risks benefits rate control vs rhythm control with the patient.  Patient understands cardiac arrest and need for CPR, aspiration pneumonia, but not limited to these. Patient is willing. It will be scheduled for in the next 1-2 weeks.  CC. Dr. Tomi Bamberger. CC: Janace Hoard, MD.    Signed by Pamella Pert, MD (05/11/2015 6:33 PM)

## 2015-05-16 ENCOUNTER — Ambulatory Visit (HOSPITAL_COMMUNITY): Payer: BLUE CROSS/BLUE SHIELD | Admitting: Critical Care Medicine

## 2015-05-16 ENCOUNTER — Encounter (HOSPITAL_COMMUNITY): Payer: Self-pay | Admitting: *Deleted

## 2015-05-16 ENCOUNTER — Ambulatory Visit (HOSPITAL_COMMUNITY)
Admission: RE | Admit: 2015-05-16 | Discharge: 2015-05-16 | Disposition: A | Discharge status: Home / Self Care | Payer: BLUE CROSS/BLUE SHIELD | Source: Ambulatory Visit | Attending: Cardiology | Admitting: Cardiology

## 2015-05-16 ENCOUNTER — Encounter (HOSPITAL_COMMUNITY)
Admission: RE | Disposition: A | Discharge status: Home / Self Care | Payer: Self-pay | Source: Ambulatory Visit | Attending: Cardiology

## 2015-05-16 DIAGNOSIS — E7800 Pure hypercholesterolemia, unspecified: Secondary | ICD-10-CM | POA: Insufficient documentation

## 2015-05-16 DIAGNOSIS — Z87891 Personal history of nicotine dependence: Secondary | ICD-10-CM | POA: Diagnosis not present

## 2015-05-16 DIAGNOSIS — Z7901 Long term (current) use of anticoagulants: Secondary | ICD-10-CM | POA: Insufficient documentation

## 2015-05-16 DIAGNOSIS — E669 Obesity, unspecified: Secondary | ICD-10-CM | POA: Diagnosis not present

## 2015-05-16 DIAGNOSIS — K219 Gastro-esophageal reflux disease without esophagitis: Secondary | ICD-10-CM | POA: Diagnosis not present

## 2015-05-16 DIAGNOSIS — I480 Paroxysmal atrial fibrillation: Secondary | ICD-10-CM | POA: Insufficient documentation

## 2015-05-16 DIAGNOSIS — I340 Nonrheumatic mitral (valve) insufficiency: Secondary | ICD-10-CM | POA: Insufficient documentation

## 2015-05-16 DIAGNOSIS — I4819 Other persistent atrial fibrillation: Secondary | ICD-10-CM | POA: Diagnosis present

## 2015-05-16 DIAGNOSIS — G4733 Obstructive sleep apnea (adult) (pediatric): Secondary | ICD-10-CM | POA: Insufficient documentation

## 2015-05-16 DIAGNOSIS — Z79899 Other long term (current) drug therapy: Secondary | ICD-10-CM | POA: Insufficient documentation

## 2015-05-16 DIAGNOSIS — Z6837 Body mass index (BMI) 37.0-37.9, adult: Secondary | ICD-10-CM | POA: Diagnosis not present

## 2015-05-16 DIAGNOSIS — I4891 Unspecified atrial fibrillation: Secondary | ICD-10-CM | POA: Diagnosis present

## 2015-05-16 HISTORY — PX: CARDIOVERSION: SHX1299

## 2015-05-16 SURGERY — CARDIOVERSION
Anesthesia: Monitor Anesthesia Care

## 2015-05-16 MED ORDER — SODIUM CHLORIDE 0.9 % IV SOLN
INTRAVENOUS | Status: DC
  Administered 2015-05-16: 500 mL via INTRAVENOUS

## 2015-05-16 MED ORDER — PROPOFOL 10 MG/ML IV BOLUS
INTRAVENOUS | Status: DC | PRN
  Administered 2015-05-16: 120 mg via INTRAVENOUS

## 2015-05-16 MED ORDER — LIDOCAINE HCL (CARDIAC) 20 MG/ML IV SOLN
INTRAVENOUS | Status: DC | PRN
  Administered 2015-05-16: 80 mg via INTRATRACHEAL

## 2015-05-16 NOTE — Interval H&P Note (Signed)
History and Physical Interval Note:  05/16/2015 12:03 PM  Craig Morgan  has presented today for surgery, with the diagnosis of afib  The various methods of treatment have been discussed with the patient and family. After consideration of risks, benefits and other options for treatment, the patient has consented to  Procedure(s): CARDIOVERSION (N/A) as a surgical intervention .  The patient's history has been reviewed, patient examined, no change in status, stable for surgery.  I have reviewed the patient's chart and labs.  Questions were answered to the patient's satisfaction.     Yates Decamp

## 2015-05-16 NOTE — Anesthesia Preprocedure Evaluation (Addendum)
Anesthesia Evaluation  Patient identified by MRN, date of birth, ID band Patient awake    Reviewed: Allergy & Precautions, NPO status , Patient's Chart, lab work & pertinent test results  Airway Mallampati: II  TM Distance: >3 FB Neck ROM: Full    Dental  (+) Dental Advisory Given, Teeth Intact   Pulmonary shortness of breath and with exertion, sleep apnea and Continuous Positive Airway Pressure Ventilation , former smoker,    Pulmonary exam normal breath sounds clear to auscultation       Cardiovascular Normal cardiovascular exam+ dysrhythmias Atrial Fibrillation  Rhythm:Regular Rate:Normal     Neuro/Psych  Neuromuscular disease negative psych ROS   GI/Hepatic Neg liver ROS, GERD  Medicated and Controlled,  Endo/Other  Obesity  Renal/GU negative Renal ROS  negative genitourinary   Musculoskeletal  (+) Arthritis ,   Abdominal (+) + obese,   Peds  Hematology   Anesthesia Other Findings   Reproductive/Obstetrics                           Anesthesia Physical Anesthesia Plan  ASA: II  Anesthesia Plan: MAC   Post-op Pain Management:    Induction: Intravenous  Airway Management Planned: Mask  Additional Equipment:   Intra-op Plan:   Post-operative Plan:   Informed Consent: I have reviewed the patients History and Physical, chart, labs and discussed the procedure including the risks, benefits and alternatives for the proposed anesthesia with the patient or authorized representative who has indicated his/her understanding and acceptance.   Dental advisory given  Plan Discussed with: Anesthesiologist and Surgeon  Anesthesia Plan Comments:         Anesthesia Quick Evaluation

## 2015-05-16 NOTE — CV Procedure (Signed)
Direct current cardioversion:  Indication symptomatic A. Fibrillation.  Procedure: Using 80 mg of IV Propofol and 40 IV Lidocaine (for reducing venous pain) for achieving deep sedation, synchronized direct current cardioversion performed. Patient was delivered with 120, 150, 200 Joules of electricity X 2 with no success, patient persisted in A. Fib. Patient tolerated the procedure well. No immediate complication noted.

## 2015-05-16 NOTE — Discharge Instructions (Signed)
Electrical Cardioversion, Care After °Refer to this sheet in the next few weeks. These instructions provide you with information on caring for yourself after your procedure. Your health care provider may also give you more specific instructions. Your treatment has been planned according to current medical practices, but problems sometimes occur. Call your health care provider if you have any problems or questions after your procedure. °WHAT TO EXPECT AFTER THE PROCEDURE °After your procedure, it is typical to have the following sensations: °· Some redness on the skin where the shocks were delivered. If this is tender, a sunburn lotion or hydrocortisone cream may help. °· Possible return of an abnormal heart rhythm within hours or days after the procedure. °HOME CARE INSTRUCTIONS °· Take medicines only as directed by your health care provider. Be sure you understand how and when to take your medicine. °· Learn how to feel your pulse and check it often. °· Limit your activity for 48 hours after the procedure or as directed by your health care provider. °· Avoid or minimize caffeine and other stimulants as directed by your health care provider. °SEEK MEDICAL CARE IF: °· You feel like your heart is beating too fast or your pulse is not regular. °· You have any questions about your medicines. °· You have bleeding that will not stop. °SEEK IMMEDIATE MEDICAL CARE IF: °· You are dizzy or feel faint. °· It is hard to breathe or you feel short of breath. °· There is a change in discomfort in your chest. °· Your speech is slurred or you have trouble moving an arm or leg on one side of your body. °· You get a serious muscle cramp that does not go away. °· Your fingers or toes turn cold or blue. °  °This information is not intended to replace advice given to you by your health care provider. Make sure you discuss any questions you have with your health care provider. °  °Document Released: 12/30/2012 Document Revised: 04/01/2014  Document Reviewed: 12/30/2012 °Elsevier Interactive Patient Education ©2016 [employer] ° °

## 2015-05-16 NOTE — Anesthesia Postprocedure Evaluation (Signed)
Anesthesia Post Note  Patient: Craig Morgan  Procedure(s) Performed: Procedure(s) (LRB): CARDIOVERSION (N/A)  Patient location during evaluation: Endoscopy Anesthesia Type: MAC Level of consciousness: awake and alert and oriented Pain management: pain level controlled Vital Signs Assessment: post-procedure vital signs reviewed and stable Respiratory status: nonlabored ventilation, respiratory function stable and spontaneous breathing Cardiovascular status: blood pressure returned to baseline and stable Postop Assessment: no signs of nausea or vomiting Anesthetic complications: no    Last Vitals:  Filed Vitals:   05/16/15 1150 05/16/15 1200  BP: 118/77 131/78  Pulse: 66 66  Temp:    Resp: 13 14    Last Pain: There were no vitals filed for this visit.               Langston Tuberville A.

## 2015-05-16 NOTE — Transfer of Care (Signed)
Immediate Anesthesia Transfer of Care Note  Patient: Craig Morgan  Procedure(s) Performed: Procedure(s): CARDIOVERSION (N/A)  Patient Location: Endoscopy Unit  Anesthesia Type:MAC  Level of Consciousness: awake, alert  and oriented  Airway & Oxygen Therapy: Patient Spontanous Breathing  Post-op Assessment: Report given to RN and Post -op Vital signs reviewed and stable  Post vital signs: Reviewed and stable  Last Vitals:  Filed Vitals:   05/16/15 1031  BP: 146/40  Pulse: 82  Temp: 36.7 C  Resp: 14    Complications: No apparent anesthesia complications

## 2015-05-17 ENCOUNTER — Encounter (HOSPITAL_COMMUNITY): Payer: Self-pay | Admitting: Cardiology

## 2015-06-01 ENCOUNTER — Ambulatory Visit (INDEPENDENT_AMBULATORY_CARE_PROVIDER_SITE_OTHER): Payer: BLUE CROSS/BLUE SHIELD | Admitting: Neurology

## 2015-06-01 ENCOUNTER — Encounter: Payer: Self-pay | Admitting: Neurology

## 2015-06-01 VITALS — BP 110/73 | HR 81 | Ht 74.0 in | Wt 279.0 lb

## 2015-06-01 DIAGNOSIS — M5136 Other intervertebral disc degeneration, lumbar region: Secondary | ICD-10-CM | POA: Diagnosis not present

## 2015-06-01 DIAGNOSIS — I480 Paroxysmal atrial fibrillation: Secondary | ICD-10-CM | POA: Diagnosis not present

## 2015-06-01 MED ORDER — OXCARBAZEPINE 150 MG PO TABS: 150.0000 mg | ORAL_TABLET | Freq: Two times a day (BID) | ORAL | Status: DC

## 2015-06-01 NOTE — Progress Notes (Signed)
PATIENT: Craig Morgan DOB: 1955/06/28  Chief Complaint  Patient presents with  . Back Pain    Reports low back pain that radiates down left leg and into his left foot.     HISTORICAL  Craig Morgan is a 60 years old right-handed male, seen in refer by  his cardiologist Dr. Sherilyn Banker for evaluation of low back pain, radiating pain to bilateral lower extremity, left foot sensitivity in June 01 2015.  He had a history of obstructive sleep apnea, using CPAP machine, atrial fibrillation, is currently taking Eliquis  I reviewed and summarized his referring note, he had a cardial conversion for his atrial fibrillation in May 16 2015 without success, echocardiogram February eighth 2017, left ventricles cavity is normal in size, more mild to moderate concentric hypertrophy of the left ventricle, mildly decreased global wall motion,  Laboratory evaluation total cholesterol 155, triglyceride 137, HDL 35, LDL 93, TSH 1.8 8, normal CBC, creatinine 0.8 9, potassium 4.3, normal CMP,  He has chronic low back pain, going to chiropractor for years, he had a flare up of left low back pain, radiating pain to left leg in 2015, lasted for 6 weeks, he was taken to hospital by ambulance, because of severe pain, he could not get out of chair, since then he began to notice bilateral feet numbness, left worse than right, as if he walks on plastic bubbles, he also complains of left lateral foot sensitivity, he could not wear shoes, he denies bowel and bladder incontinence  Previously she was treated with gabapentin, Lyrica, complains of weight gain side effect, no significant benefit, he is now taking Requip every night for his restless leg symptoms, which has been very helpful, he also reported previous epidural injection has no long-lasting effect.  I have personally reviewed x-ray of lumbar in 2015: No fracture or acute finding.  Degenerative changes as described including a grade 1 anterolisthesis of  L4 on L5.   MRI of lumbar in 2015, multilevel lumbar degenerative changes, most severe at L3-4 L4-5, bilateral foraminal stenosis, left worse than right, moderate to severe.  REVIEW OF SYSTEMS: Full 14 system review of systems performed and notable only for swelling in legs, joint pain, cramps, numbness.  ALLERGIES: Allergies  Allergen Reactions  . Penicillins     REACTION: hives as a child    HOME MEDICATIONS: Current Outpatient Prescriptions  Medication Sig Dispense Refill  . ELIQUIS 5 MG TABS tablet Take 1 tablet by mouth 2 (two) times daily.  0  . Garlic 2000 MG CAPS Take 1 capsule by mouth daily. Reported on 03/15/2015    . Omega-3 Fatty Acids (FISH OIL) 1000 MG CAPS Take 1 capsule by mouth daily.      Marland Kitchen omeprazole (PRILOSEC) 20 MG capsule Take 20 mg by mouth daily.    . phenylephrine (NEO-SYNEPHRINE) 0.25 % nasal spray Place 1 spray into both nostrils every 6 (six) hours as needed for congestion.    Marland Kitchen rOPINIRole (REQUIP) 4 MG tablet Take 4 mg by mouth 3 (three) times daily as needed.      No current facility-administered medications for this visit.    PAST MEDICAL HISTORY: Past Medical History  Diagnosis Date  . Dysrhythmia     A fib  . Sleep apnea     cpap  . GERD (gastroesophageal reflux disease)   . Arthritis     Back  . Shortness of breath     rest and exertion-WHEN PT WAS HAVING AF  .  Pain     LEFT TESTICLE AND CRAMPS IN LEFT UPPER THIGH AND ALSO RIGHT ; FEELS LIKE NEEDLES IN LEGS--LYRICA HAS HELPED  . Numbness     RIGHT LEG - KNEE TO ANKLE - STATES HE WAS GIVEN INJECTION ONCE FOR SCIATIC PROBLEM AND THE NUMBNESS BEGAN AFTER THE INJECTION.  . Back pain   . Atrial fibrillation (HCC)     PAST SURGICAL HISTORY: Past Surgical History  Procedure Laterality Date  . Hemorroidectomy    . Adenoidectomy    . Appendectomy    . Cardioversion  02/04/2012    Procedure: CARDIOVERSION;  Surgeon: Pamella Pert, MD;  Location: [facility] ENDOSCOPY;  Service: Cardiovascular;   Laterality: N/A;  . Hernia repair      LEFT INGUINAL HERNIA;  2ND SURGERY TO DO REVISION LEFT INGUINAL HERNIA AND REPAIR RT INGUINAL HERNIA  . Eye surgery      LASIK EYE SURG X 2 LEFT EYE AND ONCE ON RT EYE  . Inguinal hernia repair Left 12/08/2012    Procedure: open left inguinal  EXPLORATION;  Surgeon: Valarie Merino, MD;  Location: [facility] ORS;  Service: General;  Laterality: Left;  . Cardioversion N/A 05/16/2015    Procedure: CARDIOVERSION;  Surgeon: Yates Decamp, MD;  Location: [facility] ENDOSCOPY;  Service: Cardiovascular;  Laterality: N/A;    FAMILY HISTORY: Family History  Problem Relation Age of Onset  . Heart disease Father   . Heart attack Father   . COPD Mother   . Heart attack Mother   . Cancer Maternal Grandfather     lung    SOCIAL HISTORY:  Social History   Social History  . Marital Status: Single    Spouse Name: N/A  . Number of Children: 2  . Years of Education: 12   Occupational History  . Rental Equipment    Social History Main Topics  . Smoking status: Former Games developer  . Smokeless tobacco: Current User    Types: Snuff     Comment: Uses dips occasionally.  . Alcohol Use: 0.0 oz/week    0 Standard drinks or equivalent per week     Comment: Drinks frequently  . Drug Use: No  . Sexual Activity: Not on file   Other Topics Concern  . Not on file   Social History Narrative   Lives at home alone.   Right-handed.   2 sodas and 2 cups coffee per day.     PHYSICAL EXAM   Filed Vitals:   06/01/15 0800  BP: 110/73  Pulse: 81  Height:  (1.88 m)  Weight: 279 lb (126.554 kg)    Not recorded      Body mass index is 35.81 kg/(m^2).  PHYSICAL EXAMNIATION:  Gen: NAD, conversant, well nourised, obese, well groomed                     Cardiovascular: Regular rate rhythm, no peripheral edema, warm, nontender. Eyes: Conjunctivae clear without exudates or hemorrhage Neck: Supple, no carotid bruise. Pulmonary: Clear to auscultation bilaterally    NEUROLOGICAL EXAM:  MENTAL STATUS: Speech:    Speech is normal; fluent and spontaneous with normal comprehension.  Cognition:     Orientation to time, place and person     Normal recent and remote memory     Normal Attention span and concentration     Normal Language, naming, repeating,spontaneous speech     Fund of knowledge   CRANIAL NERVES: CN II: Visual fields are full to confrontation. Fundoscopic  exam is normal with sharp discs and no vascular changes. Pupils are round equal and briskly reactive to light. CN III, IV, VI: extraocular movement are normal. No ptosis. CN V: Facial sensation is intact to pinprick in all 3 divisions bilaterally. Corneal responses are intact.  CN VII: Face is symmetric with normal eye closure and smile. CN VIII: Hearing is normal to rubbing fingers CN IX, X: Palate elevates symmetrically. Phonation is normal. CN XI: Head turning and shoulder shrug are intact CN XII: Tongue is midline with normal movements and no atrophy.  MOTOR: There is no pronator drift of out-stretched arms. Muscle bulk and tone are normal. Muscle strength is normal, with exception of mild left toe extension flexion weakness.  REFLEXES: Reflexes are 2+ and symmetric at the biceps, triceps, knees, and ankles. Plantar responses are flexor.  SENSORY: Intact to light touch, pinprick, position sense, and vibration sense are intact in fingers and toes.  COORDINATION: Rapid alternating movements and fine finger movements are intact. There is no dysmetria on finger-to-nose and heel-knee-shin.    GAIT/STANCE: Antalgic, cautious  DIAGNOSTIC DATA (LABS, IMAGING, TESTING) - I reviewed patient records, labs, notes, testing and imaging myself where available.   ASSESSMENT AND PLAN  Fransisco BeauRonnie L Kronenberger is a 60 y.o. male   Lumbar radiculopathy  Has tried and failed gabapentin, Lyrica   Trileptal 150 twice a day  MRI of lumbar  I have suggested EMG nerve conduction study, patient  does not want to have it now,   Levert FeinsteinYijun Joya Willmott, M.D. Ph.D.  [facility] [address], [address] [place], [place] [address] Ph: [phone] Fax: [phone]  CC: [name], Tomi BambergerSusan Fuller, NP

## 2015-06-12 ENCOUNTER — Ambulatory Visit (INDEPENDENT_AMBULATORY_CARE_PROVIDER_SITE_OTHER): Payer: BLUE CROSS/BLUE SHIELD | Admitting: Internal Medicine

## 2015-06-12 ENCOUNTER — Encounter: Payer: Self-pay | Admitting: Internal Medicine

## 2015-06-12 ENCOUNTER — Encounter: Payer: Self-pay | Admitting: *Deleted

## 2015-06-12 VITALS — BP 110/86 | HR 82 | Ht 74.0 in | Wt 285.6 lb

## 2015-06-12 DIAGNOSIS — I481 Persistent atrial fibrillation: Secondary | ICD-10-CM | POA: Diagnosis not present

## 2015-06-12 DIAGNOSIS — Z9989 Dependence on other enabling machines and devices: Secondary | ICD-10-CM

## 2015-06-12 DIAGNOSIS — G4733 Obstructive sleep apnea (adult) (pediatric): Secondary | ICD-10-CM | POA: Diagnosis not present

## 2015-06-12 DIAGNOSIS — I517 Cardiomegaly: Secondary | ICD-10-CM | POA: Diagnosis not present

## 2015-06-12 DIAGNOSIS — I4819 Other persistent atrial fibrillation: Secondary | ICD-10-CM

## 2015-06-12 NOTE — Patient Instructions (Signed)
Medication Instructions:  Your physician recommends that you continue on your current medications as directed. Please refer to the Current Medication list given to you today.  Labwork: None ordered.  Testing/Procedures: None ordered.  Follow-Up: Your physician recommends that you schedule a follow-up appointment as needed.   Any Other Special Instructions Will Be Listed Below (If Applicable).     If you need a refill on your cardiac medications before your next appointment, please call your pharmacy.   

## 2015-06-12 NOTE — Progress Notes (Signed)
Electrophysiology Office Note   Date:  06/12/2015   ID:  Craig BeauRonnie L Tarango, DOB 09-10-1955, MRN [id number]  PCP:  Tomi BambergerFULLER,SUSAN, NP  Cardiologist:  Dr Jacinto HalimGanji Primary Electrophysiologist: Hillis RangeJames Daymen Hassebrock, MD    Chief Complaint  Patient presents with  . Atrial Fibrillation     History of Present Illness: Craig Morgan is a 60 y.o. male who presents today for electrophysiology evaluation.   He reports initially having atrial flutter in 2012.  He then had atrial fibrillation in 2013 for which he was cardioverted.  He was found to have sleep apnea and was not compliant with CPAP.  His primary concern over the past few years has been with chronic back pain.  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  He was referred to Dr Jacinto HalimGanji (has not been seen in several years) and was found to have recurrence of atrial fibrillation.  The patient was unaware of atrial fibrillation.  He does reports that he has been "getting tired easy" for more than 6 months.  He is primarily limited by DDD/backpain.  He has also struggled with sciatica.  He was placed on eliquis and proceeded to cardioversion 05/16/15.  Cardioversion was unsuccessful.  He has not tried AAD therapy.  He is referred to EP for further evaluation.     Today, he denies symptoms of chest pain,  orthopnea, PND, lower extremity edema, claudication, dizziness, presyncope, syncope, bleeding, or neurologic sequela. The patient is tolerating medications without difficulties and is otherwise without complaint today.    Past Medical History  Diagnosis Date  . Persistent atrial fibrillation (HCC)   . Sleep apnea     uses cpap  . GERD (gastroesophageal reflux disease)   . DDD (degenerative disc disease), lumbosacral     Back  . Numbness     RIGHT LEG - KNEE TO ANKLE - STATES HE WAS GIVEN INJECTION ONCE FOR SCIATIC PROBLEM AND THE NUMBNESS BEGAN AFTER THE INJECTION.  . Back pain   . Atrial flutter  (HCC) 2012  . Sciatica    Past Surgical History  Procedure Laterality Date  . Hemorroidectomy    . Adenoidectomy    . Appendectomy    . Cardioversion  02/04/2012    Procedure: CARDIOVERSION;  Surgeon: Pamella PertJagadeesh R Ganji, MD;  [facility];  Service: Cardiovascular;  Laterality: N/A;  . Hernia repair      LEFT INGUINAL HERNIA;  2ND SURGERY TO DO REVISION LEFT INGUINAL HERNIA AND REPAIR RT INGUINAL HERNIA  . Eye surgery      LASIK EYE SURG X 2 LEFT EYE AND ONCE ON RT EYE  . Inguinal hernia repair Left 12/08/2012    Procedure: open left inguinal  EXPLORATION;  Surgeon: Valarie MerinoMatthew B Martin, MD;  Location: [facility];  Service: General;  Laterality: Left;  . Cardioversion N/A 05/16/2015    Procedure: CARDIOVERSION;  Surgeon: Yates DecampJay Ganji, MD;  [facility];  Service: Cardiovascular;  Laterality: N/A;     Current Outpatient Prescriptions  Medication Sig Dispense Refill  . ELIQUIS 5 MG TABS tablet Take 1 tablet by mouth 2 (two) times daily.  0  . Garlic 2000 MG CAPS Take 1 capsule by mouth daily. Reported on 03/15/2015    . Omega-3 Fatty Acids (FISH OIL) 1000 MG CAPS Take 1 capsule by mouth daily.      Marland Kitchen. omeprazole (PRILOSEC) 20 MG capsule Take 20 mg by mouth daily.    . OXcarbazepine (  TRILEPTAL) 150 MG tablet Take 1 tablet (150 mg total) by mouth 2 (two) times daily. 60 tablet 11  . phenylephrine (NEO-SYNEPHRINE) 0.25 % nasal spray Place 1 spray into both nostrils every 6 (six) hours as needed for congestion.    Marland Kitchen rOPINIRole (REQUIP) 4 MG tablet Take 4 mg by mouth 3 (three) times daily as needed.      No current facility-administered medications for this visit.    Allergies:   Penicillins   Social History:  The patient  reports that he has quit smoking. His smokeless tobacco use includes Snuff. He reports that he drinks alcohol. He reports that he does not use illicit drugs.   Family History:  The patient's  family history includes COPD in his mother; Cancer in his maternal  grandfather; Heart attack in his father and mother; Heart disease in his father.    ROS:  Please see the history of present illness.   All other systems are reviewed and negative.    PHYSICAL EXAM: VS:  BP 110/86 mmHg  Pulse 82  Ht  (1.88 m)  Wt 285 lb 9.6 oz (129.547 kg)  BMI 36.65 kg/m2 , BMI Body mass index is 36.65 kg/(m^2). GEN: Well nourished, well developed, in no acute distress HEENT: normal Neck: no JVD, carotid bruits, or masses Cardiac: iRRR; no murmurs, rubs, or gallops,no edema  Respiratory:  clear to auscultation bilaterally, normal work of breathing GI: soft, nontender, nondistended, + BS MS: no deformity or atrophy Skin: warm and dry  Neuro:  Strength and sensation are intact Psych: euthymic mood, full affect  EKG:  EKG is ordered today. The ekg ordered today shows atrial fibrillation 82 bpm, nonspecific ST?T changes, Qtc 415 msec.   Recent Labs: No results found for requested labs within last 365 days.    Lipid Panel     Component Value Date/Time   CHOL 163 06/17/2011 1000   TRIG 104 06/17/2011 1000   HDL 38* 06/17/2011 1000   CHOLHDL 4.3 06/17/2011 1000   VLDL 21 06/17/2011 1000   LDLCALC 104* 06/17/2011 1000     Wt Readings from Last 3 Encounters:  06/12/15 285 lb 9.6 oz (129.547 kg)  06/12/15 279 lb (126.554 kg)  06/01/15 279 lb (126.554 kg)      Other studies Reviewed: Additional studies/ records that were reviewed today include: Dr Verl Dicker notes (15 pages) as well as epic records1  Review of the above records today demonstrates: echo 05/03/15 reveals mild to moderate LVH, EF 50%, LA size 53 mm (severely dilated), mild MR, dilated aortic root myoview 08/23/10- EF 57%, no ischemia   ASSESSMENT AND PLAN:  1.  Persistent atrial fibrillation The patient has had prolonged atrial fibrillation with severe left atrial enlargement.  I think that our ability to achieve/ maintain sinus rhythm long term are low.  Fortunately, he is rate controlled  and minimally symptomatic.  I think that rate control is probably best long term.  I did discuss both tikosyn and amiodarone as options for rhythm control.  Given his young age, Joice Lofts would be my preference should he decide to consider rhythm control.  I would not advise ablation for this patient at this time.  chads2vasc score appears to be 0.  Could potentially stop anticoagulation if we decide to not pursue sinus rhythm.  I will defer to Dr Jacinto Halim.  2. OSA Compliance with CPAP encouarged  Follow-up with Dr Jacinto Halim as scheduled I will see as needed going forward.  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  I have provided him with a letter stating such today at his request.  Current medicines are reviewed at length with the patient today.   The patient does not have concerns regarding his medicines.  The following changes were made today:  none   Signed, Hillis Range, MD  06/12/2015 11:23 AM     [facility] [address] [place] [address] [phone] (office) [phone] (fax)

## 2015-06-14 ENCOUNTER — Other Ambulatory Visit: Payer: BLUE CROSS/BLUE SHIELD

## 2015-06-22 ENCOUNTER — Ambulatory Visit: Payer: BLUE CROSS/BLUE SHIELD | Admitting: Neurology

## 2015-07-06 DIAGNOSIS — Z9889 Other specified postprocedural states: Secondary | ICD-10-CM | POA: Diagnosis not present

## 2015-07-06 DIAGNOSIS — E7800 Pure hypercholesterolemia, unspecified: Secondary | ICD-10-CM | POA: Diagnosis not present

## 2015-07-06 DIAGNOSIS — G4733 Obstructive sleep apnea (adult) (pediatric): Secondary | ICD-10-CM | POA: Diagnosis not present

## 2015-07-06 DIAGNOSIS — I480 Paroxysmal atrial fibrillation: Secondary | ICD-10-CM | POA: Diagnosis not present

## 2016-02-12 DIAGNOSIS — N401 Enlarged prostate with lower urinary tract symptoms: Secondary | ICD-10-CM | POA: Diagnosis not present

## 2016-02-12 DIAGNOSIS — E669 Obesity, unspecified: Secondary | ICD-10-CM | POA: Diagnosis not present

## 2016-02-12 DIAGNOSIS — G4733 Obstructive sleep apnea (adult) (pediatric): Secondary | ICD-10-CM | POA: Diagnosis not present

## 2016-02-12 DIAGNOSIS — E7800 Pure hypercholesterolemia, unspecified: Secondary | ICD-10-CM | POA: Diagnosis not present

## 2016-02-12 DIAGNOSIS — G2581 Restless legs syndrome: Secondary | ICD-10-CM | POA: Diagnosis not present

## 2016-02-12 DIAGNOSIS — I4891 Unspecified atrial fibrillation: Secondary | ICD-10-CM | POA: Diagnosis not present

## 2016-02-12 DIAGNOSIS — K219 Gastro-esophageal reflux disease without esophagitis: Secondary | ICD-10-CM | POA: Diagnosis not present

## 2016-02-12 DIAGNOSIS — E559 Vitamin D deficiency, unspecified: Secondary | ICD-10-CM | POA: Diagnosis not present

## 2016-03-27 DIAGNOSIS — G4733 Obstructive sleep apnea (adult) (pediatric): Secondary | ICD-10-CM | POA: Diagnosis not present

## 2016-03-27 DIAGNOSIS — Z9889 Other specified postprocedural states: Secondary | ICD-10-CM | POA: Diagnosis not present

## 2016-03-27 DIAGNOSIS — I480 Paroxysmal atrial fibrillation: Secondary | ICD-10-CM | POA: Diagnosis not present

## 2016-12-09 ENCOUNTER — Encounter (HOSPITAL_COMMUNITY): Payer: Self-pay | Admitting: Emergency Medicine

## 2016-12-09 ENCOUNTER — Ambulatory Visit (HOSPITAL_COMMUNITY)
Admission: EM | Admit: 2016-12-09 | Discharge: 2016-12-09 | Disposition: A | Discharge status: Home / Self Care | Payer: BLUE CROSS/BLUE SHIELD | Attending: Family Medicine | Admitting: Family Medicine

## 2016-12-09 DIAGNOSIS — H918X2 Other specified hearing loss, left ear: Secondary | ICD-10-CM

## 2016-12-09 DIAGNOSIS — H6122 Impacted cerumen, left ear: Secondary | ICD-10-CM | POA: Diagnosis not present

## 2016-12-09 MED ORDER — NEOMYCIN-POLYMYXIN-HC 3.5-10000-1 OT SUSP: 4.0000 [drp] | Freq: Two times a day (BID) | OTIC | 1 refills | Status: DC | PRN

## 2016-12-09 NOTE — ED Provider Notes (Signed)
[facility]   [id number] 12/09/16 Arrival Time: 1115   SUBJECTIVE:  Craig Morgan is a 61 y.o. male who presents to the urgent care with complaint of decreased hearing left ear.  This is a recurring problem.  The hearing loss developed yesterday  Patient drives a truck for a living     Past Medical History:  Diagnosis Date  . Atrial flutter (HCC) 2012  . Back pain   . DDD (degenerative disc disease), lumbosacral    Back  . GERD (gastroesophageal reflux disease)   . Numbness    RIGHT LEG - KNEE TO ANKLE - STATES HE WAS GIVEN INJECTION ONCE FOR SCIATIC PROBLEM AND THE NUMBNESS BEGAN AFTER THE INJECTION.  Marland Kitchen Persistent atrial fibrillation (HCC)   . Sciatica   . Sleep apnea    uses cpap   Family History  Problem Relation Age of Onset  . Heart disease Father   . Heart attack Father   . COPD Mother   . Heart attack Mother   . Cancer Maternal Grandfather        lung   Social History   Social History  . Marital status: Single    Spouse name: N/A  . Number of children: 2  . Years of education: 12   Occupational History  . Rental Equipment    Social History Main Topics  . Smoking status: Former Games developer  . Smokeless tobacco: Current User    Types: Snuff     Comment: Uses dips occasionally.  . Alcohol use 0.0 oz/week     Comment: 3-4 beers per week  . Drug use: No  . Sexual activity: Not on file   Other Topics Concern  . Not on file   Social History Narrative   Lives in [place] at home alone.   Right-handed.   2 sodas and 2 cups coffee per day.   Truck driver   No outpatient prescriptions have been marked as taking for the 12/09/16 encounter [facility] Encounter).   Allergies  Allergen Reactions  . Penicillins     REACTION: hives as a child      ROS: As per HPI, remainder of ROS negative.   OBJECTIVE:   Vitals:   12/09/16 1125 12/09/16 1126  BP:  127/89  Pulse:  95  Resp:  16  Temp:  98.3 F (36.8 C)  TempSrc:  Oral  SpO2:   97%  Weight: 280 lb (127 kg)   Height:  (1.88 m)      General appearance: alert; no distress Eyes: PERRL; EOMI; conjunctiva normal HENT: normocephalic; atraumatic; TMs normal, canal obstructed with wax on left and normal on the right, external ears normal without trauma; nasal mucosa normal; oral mucosa normal Neck: supple Extremities: no cyanosis or edema; symmetrical with no gross deformities Skin: warm and dry Neurologic: normal gait; grossly normal Psychological: alert and cooperative; normal mood and affect   Left ear lavaged clear   Labs:  Results for orders placed or performed during the hospital encounter of 12/02/12  CBC  Result Value Ref Range   WBC 4.6 4.0 - 10.5 K/uL   RBC 3.86 (L) 4.22 - 5.81 MIL/uL   Hemoglobin 13.6 13.0 - 17.0 g/dL   HCT 78.2 (L) 95.6 - 21.3 %   MCV 100.5 (H) 78.0 - 100.0 fL   MCH 35.2 (H) 26.0 - 34.0 pg   MCHC 35.1 30.0 - 36.0 g/dL   RDW 08.6 57.8 - 46.9 %   Platelets 197 150 -  400 K/uL  Basic metabolic panel  Result Value Ref Range   Sodium 139 135 - 145 mEq/L   Potassium 4.3 3.5 - 5.1 mEq/L   Chloride 103 96 - 112 mEq/L   CO2 30 19 - 32 mEq/L   Glucose, Bld 101 (H) 70 - 99 mg/dL   BUN 10 6 - 23 mg/dL   Creatinine, Ser 1.61 0.50 - 1.35 mg/dL   Calcium 9.7 8.4 - 09.6 mg/dL   GFR calc non Af Amer >90 >90 mL/min   GFR calc Af Amer >90 >90 mL/min    Labs Reviewed - No data to display  No results found.     ASSESSMENT & PLAN:  1. Hearing loss of left ear due to cerumen impaction     Meds ordered this encounter  Medications  . neomycin-polymyxin-hydrocortisone (CORTISPORIN) 3.5-10000-1 OTIC suspension    Sig: Place 4 drops into both ears 2 (two) times daily as needed.    Dispense:  10 mL    Refill:  1    Reviewed expectations re: course of current medical issues. Questions answered. Outlined signs and symptoms indicating need for more acute intervention. Patient verbalized understanding. After Visit Summary  given.       Elvina Sidle, MD 12/09/16 [phone]

## 2016-12-09 NOTE — ED Triage Notes (Signed)
PT reports wax buildup in both ears.

## 2016-12-19 DIAGNOSIS — H2513 Age-related nuclear cataract, bilateral: Secondary | ICD-10-CM | POA: Diagnosis not present

## 2016-12-19 DIAGNOSIS — H40033 Anatomical narrow angle, bilateral: Secondary | ICD-10-CM | POA: Diagnosis not present

## 2017-05-20 ENCOUNTER — Other Ambulatory Visit: Payer: Self-pay | Admitting: Cardiology

## 2017-05-20 ENCOUNTER — Ambulatory Visit
Admission: RE | Admit: 2017-05-20 | Discharge: 2017-05-20 | Disposition: A | Discharge status: Home / Self Care | Payer: BLUE CROSS/BLUE SHIELD | Source: Ambulatory Visit | Attending: Cardiology | Admitting: Cardiology

## 2017-05-20 DIAGNOSIS — Z9889 Other specified postprocedural states: Secondary | ICD-10-CM | POA: Diagnosis not present

## 2017-05-20 DIAGNOSIS — K219 Gastro-esophageal reflux disease without esophagitis: Secondary | ICD-10-CM | POA: Diagnosis not present

## 2017-05-20 DIAGNOSIS — G4733 Obstructive sleep apnea (adult) (pediatric): Secondary | ICD-10-CM | POA: Diagnosis not present

## 2017-05-20 DIAGNOSIS — R05 Cough: Secondary | ICD-10-CM | POA: Diagnosis not present

## 2017-05-20 DIAGNOSIS — R0602 Shortness of breath: Secondary | ICD-10-CM

## 2017-05-20 DIAGNOSIS — I482 Chronic atrial fibrillation: Secondary | ICD-10-CM | POA: Diagnosis not present

## 2017-05-21 DIAGNOSIS — I482 Chronic atrial fibrillation: Secondary | ICD-10-CM | POA: Diagnosis not present

## 2017-05-21 DIAGNOSIS — E785 Hyperlipidemia, unspecified: Secondary | ICD-10-CM | POA: Diagnosis not present

## 2017-05-22 DIAGNOSIS — I482 Chronic atrial fibrillation: Secondary | ICD-10-CM | POA: Diagnosis not present

## 2017-05-22 DIAGNOSIS — R0602 Shortness of breath: Secondary | ICD-10-CM | POA: Diagnosis not present

## 2017-06-02 ENCOUNTER — Inpatient Hospital Stay (HOSPITAL_COMMUNITY)
Admission: RE | Admit: 2017-06-02 | Discharge: 2017-06-05 | DRG: 310 | Disposition: A | Discharge status: Home / Self Care | Payer: BLUE CROSS/BLUE SHIELD | Source: Ambulatory Visit | Attending: Cardiology | Admitting: Cardiology

## 2017-06-02 ENCOUNTER — Other Ambulatory Visit: Payer: Self-pay

## 2017-06-02 ENCOUNTER — Encounter (HOSPITAL_COMMUNITY): Payer: Self-pay

## 2017-06-02 DIAGNOSIS — G4733 Obstructive sleep apnea (adult) (pediatric): Secondary | ICD-10-CM | POA: Diagnosis not present

## 2017-06-02 DIAGNOSIS — K219 Gastro-esophageal reflux disease without esophagitis: Secondary | ICD-10-CM | POA: Diagnosis not present

## 2017-06-02 DIAGNOSIS — I480 Paroxysmal atrial fibrillation: Principal | ICD-10-CM | POA: Diagnosis present

## 2017-06-02 DIAGNOSIS — M5430 Sciatica, unspecified side: Secondary | ICD-10-CM | POA: Diagnosis not present

## 2017-06-02 DIAGNOSIS — E785 Hyperlipidemia, unspecified: Secondary | ICD-10-CM | POA: Diagnosis present

## 2017-06-02 DIAGNOSIS — Z880 Allergy status to penicillin: Secondary | ICD-10-CM | POA: Diagnosis not present

## 2017-06-02 DIAGNOSIS — Z825 Family history of asthma and other chronic lower respiratory diseases: Secondary | ICD-10-CM | POA: Diagnosis not present

## 2017-06-02 DIAGNOSIS — E538 Deficiency of other specified B group vitamins: Secondary | ICD-10-CM | POA: Diagnosis not present

## 2017-06-02 DIAGNOSIS — Z7901 Long term (current) use of anticoagulants: Secondary | ICD-10-CM | POA: Diagnosis not present

## 2017-06-02 DIAGNOSIS — E559 Vitamin D deficiency, unspecified: Secondary | ICD-10-CM | POA: Diagnosis not present

## 2017-06-02 DIAGNOSIS — Z801 Family history of malignant neoplasm of trachea, bronchus and lung: Secondary | ICD-10-CM

## 2017-06-02 DIAGNOSIS — Z6836 Body mass index (BMI) 36.0-36.9, adult: Secondary | ICD-10-CM

## 2017-06-02 DIAGNOSIS — G621 Alcoholic polyneuropathy: Secondary | ICD-10-CM | POA: Diagnosis not present

## 2017-06-02 DIAGNOSIS — I481 Persistent atrial fibrillation: Secondary | ICD-10-CM | POA: Diagnosis present

## 2017-06-02 DIAGNOSIS — G629 Polyneuropathy, unspecified: Secondary | ICD-10-CM | POA: Diagnosis not present

## 2017-06-02 DIAGNOSIS — Z8249 Family history of ischemic heart disease and other diseases of the circulatory system: Secondary | ICD-10-CM

## 2017-06-02 DIAGNOSIS — M5137 Other intervertebral disc degeneration, lumbosacral region: Secondary | ICD-10-CM | POA: Diagnosis not present

## 2017-06-02 LAB — BASIC METABOLIC PANEL
ANION GAP: 9 (ref 5–15)
BUN: 16 mg/dL (ref 6–20)
CHLORIDE: 105 mmol/L (ref 101–111)
CO2: 23 mmol/L (ref 22–32)
Calcium: 9.3 mg/dL (ref 8.9–10.3)
Creatinine, Ser: 0.93 mg/dL (ref 0.61–1.24)
GFR calc Af Amer: >60 mL/min (ref >60)
GFR calc non Af Amer: >60 mL/min (ref >60)
GLUCOSE: 110 mg/dL — AB (ref 65–99)
POTASSIUM: 3.9 mmol/L (ref 3.5–5.1)
Sodium: 137 mmol/L (ref 135–145)

## 2017-06-02 LAB — MAGNESIUM: Magnesium: 2.1 mg/dL (ref 1.7–2.4)

## 2017-06-02 LAB — PROTIME-INR
INR: 1.11
Prothrombin Time: 14.2 seconds (ref 11.4–15.2)

## 2017-06-02 MED ORDER — POTASSIUM CHLORIDE CRYS ER 20 MEQ PO TBCR
20.0000 meq | EXTENDED_RELEASE_TABLET | Freq: Every day | ORAL | Status: DC | PRN
  Administered 2017-06-02: 20 meq via ORAL
  Filled 2017-06-02 (×2): qty 1

## 2017-06-02 MED ORDER — DOFETILIDE 500 MCG PO CAPS
500.0000 ug | ORAL_CAPSULE | Freq: Two times a day (BID) | ORAL | Status: DC
  Administered 2017-06-02 – 2017-06-05 (×6): 500 ug via ORAL
  Filled 2017-06-02 (×6): qty 1

## 2017-06-02 MED ORDER — APIXABAN 5 MG PO TABS
5.0000 mg | ORAL_TABLET | Freq: Two times a day (BID) | ORAL | Status: DC
  Administered 2017-06-02 – 2017-06-05 (×6): 5 mg via ORAL
  Filled 2017-06-02 (×6): qty 1

## 2017-06-02 MED ORDER — PANTOPRAZOLE SODIUM 40 MG PO TBEC
40.0000 mg | DELAYED_RELEASE_TABLET | Freq: Every day | ORAL | Status: DC
  Administered 2017-06-02 – 2017-06-05 (×4): 40 mg via ORAL
  Filled 2017-06-02 (×4): qty 1

## 2017-06-02 MED ORDER — ROPINIROLE HCL 1 MG PO TABS
4.0000 mg | ORAL_TABLET | Freq: Three times a day (TID) | ORAL | Status: DC
Start: 2017-06-02 — End: 2017-06-05
  Administered 2017-06-02 – 2017-06-05 (×9): 4 mg via ORAL
  Filled 2017-06-02 (×9): qty 4

## 2017-06-02 MED ORDER — SODIUM CHLORIDE 0.9 % IV SOLN
250.0000 mL | INTRAVENOUS | Status: DC | PRN
  Administered 2017-06-05: 08:00:00 via INTRAVENOUS

## 2017-06-02 MED ORDER — MAGNESIUM OXIDE 400 (241.3 MG) MG PO TABS
400.0000 mg | ORAL_TABLET | Freq: Every day | ORAL | Status: DC | PRN
  Administered 2017-06-05: 400 mg via ORAL
  Filled 2017-06-02: qty 1

## 2017-06-02 MED ORDER — SODIUM CHLORIDE 0.9% FLUSH: 3.0000 mL | INTRAVENOUS | Status: DC | PRN

## 2017-06-02 MED ORDER — SODIUM CHLORIDE 0.9% FLUSH
3.0000 mL | Freq: Two times a day (BID) | INTRAVENOUS | Status: DC
  Administered 2017-06-02 – 2017-06-03 (×2): 3 mL via INTRAVENOUS

## 2017-06-02 MED ORDER — OXCARBAZEPINE 150 MG PO TABS
150.0000 mg | ORAL_TABLET | Freq: Two times a day (BID) | ORAL | Status: DC
  Filled 2017-06-02 (×6): qty 1

## 2017-06-02 NOTE — Progress Notes (Addendum)
Pharmacy Review for Dofetilide (Tikosyn) Initiation  Admit Complaint: 62 y.o. male admitted 06/02/2017 with atrial fibrillation to be initiated on dofetilide.   Assessment:  Patient Exclusion Criteria: If any screening criteria checked as "Yes", then  patient  should NOT receive dofetilide until criteria item is corrected. If "Yes" please indicate correction plan.  YES  NO Patient  Exclusion Criteria Correction Plan  [x]  []  Baseline QTc interval is greater than or equal to 440 msec. IF above YES box checked dofetilide contraindicated unless patient has ICD; then may proceed if QTc 500-550 msec or with known ventricular conduction abnormalities may proceed with QTc 550-600 msec. QTc = 445 - in atrial fibrillation Discussed with Dr Jacinto Halim and received verbal okay to start tikosyn - will monitor repeat QTcs closely   []  [x]  Magnesium level is less than 1.8 mEq/l : Last magnesium:  Lab Results  Component Value Date   MG 2.1 06/02/2017         [x]  []  Potassium level is less than 4 mEq/l : Last potassium:  Lab Results  Component Value Date   K 3.9 06/02/2017        Replacing with 20 mEq prior to tikosyn administration  []  [x]  Patient is known or suspected to have a digoxin level greater than 2 ng/ml: No results found for: DIGOXIN    []  [x]  Creatinine clearance less than 20 ml/min (calculated using Cockcroft-Gault, actual body weight and serum creatinine): Estimated Creatinine Clearance: 120.1 mL/min (by C-G formula based on SCr of 0.93 mg/dL).    []  [x]  Patient has received drugs known to prolong the QT intervals within the last 48 hours (phenothiazines, tricyclics or tetracyclic antidepressants, erythromycin, H-1 antihistamines, cisapride, fluoroquinolones, azithromycin). Drugs not listed above may have an, as yet, undetected potential to prolong the QT interval, updated information on QT prolonging agents is available at this website:QT prolonging agents   []  [x]  Patient received a dose  of hydrochlorothiazide (Oretic) alone or in any combination including triamterene (Dyazide, Maxzide) in the last 48 hours.   []  [x]  Patient received a medication known to increase dofetilide plasma concentrations prior to initial dofetilide dose:  . Trimethoprim (Primsol, Proloprim) in the last 36 hours . Verapamil (Calan, Verelan) in the last 36 hours or a sustained release dose in the last 72 hours . Megestrol (Megace) in the last 5 days  . Cimetidine (Tagamet) in the last 6 hours . Ketoconazole (Nizoral) in the last 24 hours . Itraconazole (Sporanox) in the last 48 hours  . Prochlorperazine (Compazine) in the last 36 hours    []  [x]  Patient is known to have a history of torsades de pointes; congenital or acquired long QT syndromes.   []  [x]  Patient has received a Class 1 antiarrhythmic with less than 2 half-lives since last dose. (Disopyramide, Quinidine, Procainamide, Lidocaine, Mexiletine, Flecainide, Propafenone)   []  [x]  Patient has received amiodarone therapy in the past 3 months or amiodarone level is greater than 0.3 ng/ml.    Patient has been anticoagulated with apixaban 5 mg twice daily (MD aware last dose was 3/10 at 0900).  Ordering provider was confirmed at [email or web address] if they are not listed on the [facility] Authorized Prescribers list.  Goal of Therapy: Follow renal function, electrolytes, potential drug interactions, and dose adjustment. Provide education and 1 week supply at discharge.  Plan:  Discussed correctional plan actions with Dr Jacinto Halim and received authorization to start tikosyn.    [x]   Physician selected initial dose within range recommended for patients  level of renal function - will monitor for response.  []   Physician selected initial dose outside of range recommended for patients level of renal function - will discuss if the dose should be altered at this time.   [facility] One Calculated CrCl  Dose q12h  [x]  > 60 ml/min 500 mcg  []  40-60 ml/min 250 mcg   []  20-40 ml/min 125 mcg   2. Follow up QTc after the first 5 doses, renal function, electrolytes (K & Mg) daily x 3     days, dose adjustment, success of initiation and facilitate 1 week discharge supply as     clinically indicated.  3. Initiate Tikosyn education video (Call [phone] and ask for video # 116).  4. Place Enrollment Form on the chart for discharge supply of dofetilide.   Girard CooterKimberly Perkins, PharmD Clinical Pharmacist  Pager: [phone] Phone: [phone]  3:51 PM 06/02/2017

## 2017-06-02 NOTE — Progress Notes (Signed)
Called [name] @2000  to verify Tikosyn administration. Informed MD regarding pt. K, MG, QTc and Vitals. Got an Ok to give at 20:27. Will monitor.  K: 3.9 Mg: 2.1 BP: 120/75 HR: 106 Afib Denies CP Qtc: 430

## 2017-06-03 LAB — BASIC METABOLIC PANEL
ANION GAP: 8 (ref 5–15)
BUN: 14 mg/dL (ref 6–20)
CO2: 26 mmol/L (ref 22–32)
Calcium: 9.3 mg/dL (ref 8.9–10.3)
Chloride: 105 mmol/L (ref 101–111)
Creatinine, Ser: 0.99 mg/dL (ref 0.61–1.24)
GLUCOSE: 114 mg/dL — AB (ref 65–99)
POTASSIUM: 4.4 mmol/L (ref 3.5–5.1)
Sodium: 139 mmol/L (ref 135–145)

## 2017-06-03 LAB — HIV ANTIBODY (ROUTINE TESTING W REFLEX): HIV SCREEN 4TH GENERATION: NONREACTIVE

## 2017-06-03 LAB — MAGNESIUM: Magnesium: 2.1 mg/dL (ref 1.7–2.4)

## 2017-06-03 MED ORDER — ACETAMINOPHEN 325 MG PO TABS
650.0000 mg | ORAL_TABLET | Freq: Four times a day (QID) | ORAL | Status: DC | PRN
  Administered 2017-06-03: 650 mg via ORAL
  Filled 2017-06-03: qty 2

## 2017-06-03 NOTE — Care Management Note (Addendum)
Case Management Note  Patient Details  Name: Fransisco BeauRonnie L Mcalexander MRN: [id number] Date of Birth: [birth year]  Subjective/Objective: Pt presented for Tikosyn Load: Benefits Check completed and CM will make pt aware of cost. Pt will need Tikosyn Rx for 7 day supply no refills and the original Rx with Refills. CM will assist with the 7 day Rx. No further needs from CM @ this time.                    Action/Plan: S/W KERI @ [facility] RX # [id number]    1. TIKOSYN  500 MCG CAPSULE BID  COVER- NOT COVER  PRIOR APPROVAL- YES # [phone] FOR FORMULARY EXCEPTION    2. DOFETILIDE  500 500 MCG CAPSULE BID  COVER- YES  CO-PAY-  $ 80.00  REG CO-PAY- STRUCTURE  TIER- 4 DRUG  PRIOR APPROVAL- NO   NO DEDUCTIBLE   PREFERRED [facility]   Expected Discharge Date:                  Expected Discharge Plan:  Home/Self Care  In-House Referral:  NA  Discharge planning Services  CM Consult, Medication Assistance  Post Acute Care Choice:  NA Choice offered to:  NA  DME Arranged:  N/A DME Agency:  NA  HH Arranged:  NA HH Agency:  NA  Status of Service:  Completed, signed off  If discussed at Long Length of Stay Meetings, dates discussed:    Additional Comments: 1645 06-04-17 Tomi BambergerBrenda Graves-Bigelow, RN,BSN [phone] CM did speak with patient and he uses [facility]. Medication can be ordered-however on back order. [facility] [address] [place] [facility]. No further needs from this CM.  Gala LewandowskyGraves-Bigelow, Dyonna Jaspers Kaye, RN 06/03/2017, 12:04 PM

## 2017-06-03 NOTE — Progress Notes (Signed)
Patient in room drinking grapefruit juice that family brought him .Educated patient not to drink grapefruit juice while  on medication tikosyn may affect how medication works.Patient stated,'I'm sorry.I didn't know." Handout given on medication tikosyn and patient verbalized understanding not drinking grapefruit juice while on this medication.

## 2017-06-03 NOTE — H&P (Addendum)
Craig Morgan is an 62 y.o. male.   Chief Complaint: Atrial fibrilation HPI:   62 year old Caucasian male with symptomatic persistent atrial fibrillation, prior unsuccessful cardioversion, admitted for tikosyn loading. Currently, denies any chest pain or shortness of breath. He has occasional left shoulder pain unrelated to exertion.  Past Medical History:  Diagnosis Date  . Atrial flutter ([place]) 2012  . Back pain   . DDD (degenerative disc disease), lumbosacral    Back  . GERD (gastroesophageal reflux disease)   . Numbness    RIGHT LEG - KNEE TO ANKLE - STATES HE WAS GIVEN INJECTION ONCE FOR SCIATIC PROBLEM AND THE NUMBNESS BEGAN AFTER THE INJECTION.  Marland Kitchen Persistent atrial fibrillation ([place])   . Sciatica   . Sleep apnea    uses cpap    Past Surgical History:  Procedure Laterality Date  . ADENOIDECTOMY    . APPENDECTOMY    . CARDIOVERSION  02/04/2012   Procedure: CARDIOVERSION;  Surgeon: Laverda Page, MD;  Location: [facility];  Service: Cardiovascular;  Laterality: N/A;  . CARDIOVERSION N/A 05/16/2015   Procedure: CARDIOVERSION;  Surgeon: Adrian Prows, MD;  Location: [facility];  Service: Cardiovascular;  Laterality: N/A;  . EYE SURGERY     LASIK EYE SURG X 2 LEFT EYE AND ONCE ON RT EYE  . HEMORROIDECTOMY    . HERNIA REPAIR     LEFT INGUINAL HERNIA;  2ND SURGERY TO DO REVISION LEFT INGUINAL HERNIA AND REPAIR RT INGUINAL HERNIA  . INGUINAL HERNIA REPAIR Left 12/08/2012   Procedure: open left inguinal  EXPLORATION;  Surgeon: Pedro Earls, MD;  Location: [facility];  Service: General;  Laterality: Left;    Family History  Problem Relation Age of Onset  . Heart disease Father   . Heart attack Father   . COPD Mother   . Heart attack Mother   . Cancer Maternal Grandfather        lung   Social History:  reports that he has quit smoking. His smokeless tobacco use includes snuff. He reports that he drinks alcohol. He reports that he does not use drugs.  Allergies:   Allergies  Allergen Reactions  . Penicillins Hives    Has patient had a PCN reaction causing immediate rash, facial/tongue/throat swelling, SOB or lightheadedness with hypotension: Yes Has patient had a PCN reaction causing severe rash involving mucus membranes or skin necrosis: Unknown Has patient had a PCN reaction that required hospitalization: Unknown Has patient had a PCN reaction occurring within the last 10 years: No If all of the above answers are "NO", then may proceed with Cephalosporin use.    Medications Prior to Admission  Medication Sig Dispense Refill  . ELIQUIS 5 MG TABS tablet Take 1 tablet by mouth 2 (two) times daily.  0  . magnesium oxide (MAG-OX) 400 MG tablet Take 400 mg by mouth daily.  2  . omeprazole (PRILOSEC) 20 MG capsule Take 20 mg by mouth daily.    Marland Kitchen rOPINIRole (REQUIP) 4 MG tablet Take 4 mg by mouth 3 (three) times daily as needed (RLS).     . OXcarbazepine (TRILEPTAL) 150 MG tablet Take 1 tablet (150 mg total) by mouth 2 (two) times daily. (Patient not taking: Reported on 06/02/2017) 60 tablet 11    Results for orders placed or performed during the hospital encounter of 06/02/17 (from the past 48 hour(s))  HIV antibody (Routine Testing)     Status: None   Collection Time: 06/02/17  2:46 PM  Result  Value Ref Range   HIV Screen 4th Generation wRfx Non Reactive Non Reactive    Comment: (NOTE) Performed At: [facility] [place], [place] [id number] Rush Farmer MD KG:[id number] Performed at [facility], [place] [address] [place], [place] [address]   Basic metabolic panel     Status: Abnormal   Collection Time: 06/02/17  2:46 PM  Result Value Ref Range   Sodium 137 135 - 145 mmol/L   Potassium 3.9 3.5 - 5.1 mmol/L   Chloride 105 101 - 111 mmol/L   CO2 23 22 - 32 mmol/L   Glucose, Bld 110 (H) 65 - 99 mg/dL   BUN 16 6 - 20 mg/dL   Creatinine, Ser 0.93 0.61 - 1.24 mg/dL   Calcium 9.3 8.9 - 10.3 mg/dL   GFR calc non Af  Amer >60 >60 mL/min   GFR calc Af Amer >60 >60 mL/min    Comment: (NOTE) The eGFR has been calculated using the CKD EPI equation. This calculation has not been validated in all clinical situations. eGFR's persistently <60 mL/min signify possible Chronic Kidney Disease.    Anion gap 9 5 - 15    Comment: Performed at [facility] [address], [place], [place] [address]  Magnesium     Status: None   Collection Time: 06/02/17  2:46 PM  Result Value Ref Range   Magnesium 2.1 1.7 - 2.4 mg/dL    Comment: Performed at [facility], [place] [address], [place], [place] [address]  Protime-INR     Status: None   Collection Time: 06/02/17  8:35 PM  Result Value Ref Range   Prothrombin Time 14.2 11.4 - 15.2 seconds   INR 1.11     Comment: Performed at [facility] [address], [place], [place] [address]  Magnesium     Status: None   Collection Time: 06/03/17  4:34 AM  Result Value Ref Range   Magnesium 2.1 1.7 - 2.4 mg/dL    Comment: Performed at [facility] [address], [place], [place] [address]  Basic metabolic panel     Status: Abnormal   Collection Time: 06/03/17  4:34 AM  Result Value Ref Range   Sodium 139 135 - 145 mmol/L   Potassium 4.4 3.5 - 5.1 mmol/L   Chloride 105 101 - 111 mmol/L   CO2 26 22 - 32 mmol/L   Glucose, Bld 114 (H) 65 - 99 mg/dL   BUN 14 6 - 20 mg/dL   Creatinine, Ser 0.99 0.61 - 1.24 mg/dL   Calcium 9.3 8.9 - 10.3 mg/dL   GFR calc non Af Amer >60 >60 mL/min   GFR calc Af Amer >60 >60 mL/min    Comment: (NOTE) The eGFR has been calculated using the CKD EPI equation. This calculation has not been validated in all clinical situations. eGFR's persistently <60 mL/min signify possible Chronic Kidney Disease.    Anion gap 8 5 - 15    Comment: Performed at [facility] [address] [place], [place] [address]   No results found.  Review of Systems  Constitutional: Negative for malaise/fatigue.  HENT: Negative.   Eyes:  Negative.   Respiratory: Negative for sputum production and shortness of breath.   Cardiovascular: Negative for chest pain, leg swelling and PND.  Gastrointestinal: Negative for abdominal pain.  Genitourinary: Negative.   Musculoskeletal:       Left shoulder pain  Skin: Negative.  Neurological:       Bilateral feet parasthesias  Endo/Heme/Allergies: Does not bruise/bleed easily.  Psychiatric/Behavioral: Negative.   All other systems reviewed and are negative.   Blood pressure 113/81, pulse 74, temperature 98.5 F (36.9 C), temperature source Oral, resp. rate 16, height _0  (1.854 m), weight 126.6 kg (279 lb), SpO2 94 %. Physical Exam  Nursing note and vitals reviewed. Constitutional: He is oriented to person, place, and time. He appears well-developed and well-nourished.  HENT:  Head: Normocephalic and atraumatic.  Eyes: Pupils are equal, round, and reactive to light.  Neck: Normal range of motion. Neck supple. No JVD present.  Cardiovascular: Normal rate.  Variable S1. Irregularly irregular rhythm  Respiratory: Effort normal and breath sounds normal. He has no wheezes. He has no rales.  GI: Soft. Bowel sounds are normal. There is no tenderness.  Musculoskeletal: Normal range of motion. He exhibits no edema.  Lymphadenopathy:    He has no cervical adenopathy.  Neurological: He is alert and oriented to person, place, and time.  Skin: Skin is warm and dry.  Psychiatric: He has a normal mood and affect.      Cardiac Studies: EKG 06/03/2017: Atrial fibrillation controlled ventricular rate 70 bpm.  Normal axis.  QTc interval 434 msec  Echocardiogram 05/22/2017: Echocardiogram 05/22/2017: Left ventricle cavity is normal in size. Mild concentric hypertrophy of the left ventricle. Normal global wall motion. Unable to evaluate diastolic function due to A. Fibrillation. Calculated EF 55%. Left atrial cavity is mildly dilated. Inadequate tricuspid regurgitation jet to estimate  pulmonary artery pressure. Normal right atrial pressure.  Mildly dilated aortic root measuring 3.9 cm. No significant change compared to previous study dated 05/03/2015.  Assessment: Persistent atrial fibrillation CHA2DS2-VASc Score is 0 with yearly risk of stroke of 0%. OSA on CPAP hyperlipidemia  Plan: Admit to telemetry. Given low bleeding risk. Reasonable to treat with anticoagulation. Currently on apixaban 5 mg bid. Tikosyn 500 g twice daily. Keep K around 4, mag around 2. Regular EKG monitoring for QTc interval. Will tentatively plan for cardioversion on 03/14 morning.   Nigel Mormon, MD 06/03/2017, 4:50 PM  Manish Esther Hardy, MD [facility] Pager: [phone] Office: [phone] If no answer Cell [phone]

## 2017-06-04 LAB — BASIC METABOLIC PANEL
Anion gap: 10 (ref 5–15)
BUN: 16 mg/dL (ref 6–20)
CHLORIDE: 105 mmol/L (ref 101–111)
CO2: 23 mmol/L (ref 22–32)
CREATININE: 1.06 mg/dL (ref 0.61–1.24)
Calcium: 9.4 mg/dL (ref 8.9–10.3)
GFR calc Af Amer: >60 mL/min (ref >60)
Glucose, Bld: 111 mg/dL — ABNORMAL HIGH (ref 65–99)
Potassium: 3.6 mmol/L (ref 3.5–5.1)
SODIUM: 138 mmol/L (ref 135–145)

## 2017-06-04 LAB — CBC WITH DIFFERENTIAL/PLATELET
BASOS ABS: 0 10*3/uL (ref 0.0–0.1)
Basophils Relative: 0 %
EOS PCT: 1 %
Eosinophils Absolute: 0.1 10*3/uL (ref 0.0–0.7)
HCT: 42.3 % (ref 39.0–52.0)
Hemoglobin: 14.1 g/dL (ref 13.0–17.0)
LYMPHS ABS: 1.2 10*3/uL (ref 0.7–4.0)
LYMPHS PCT: 21 %
MCH: 33.6 pg (ref 26.0–34.0)
MCHC: 33.3 g/dL (ref 30.0–36.0)
MCV: 100.7 fL — AB (ref 78.0–100.0)
MONO ABS: 0.5 10*3/uL (ref 0.1–1.0)
MONOS PCT: 9 %
NEUTROS ABS: 4 10*3/uL (ref 1.7–7.7)
Neutrophils Relative %: 69 %
Platelets: 209 10*3/uL (ref 150–400)
RBC: 4.2 MIL/uL — ABNORMAL LOW (ref 4.22–5.81)
RDW: 14.2 % (ref 11.5–15.5)
WBC: 5.7 10*3/uL (ref 4.0–10.5)

## 2017-06-04 LAB — MAGNESIUM: MAGNESIUM: 2.1 mg/dL (ref 1.7–2.4)

## 2017-06-04 MED ORDER — SODIUM CHLORIDE 0.9 % IV SOLN: 250.0000 mL | INTRAVENOUS | Status: DC

## 2017-06-04 MED ORDER — SODIUM CHLORIDE 0.9% FLUSH
3.0000 mL | Freq: Two times a day (BID) | INTRAVENOUS | Status: DC
  Administered 2017-06-04: 3 mL via INTRAVENOUS

## 2017-06-04 MED ORDER — SODIUM CHLORIDE 0.9% FLUSH: 3.0000 mL | INTRAVENOUS | Status: DC | PRN

## 2017-06-04 MED ORDER — POTASSIUM CHLORIDE CRYS ER 20 MEQ PO TBCR
20.0000 meq | EXTENDED_RELEASE_TABLET | ORAL | Status: AC
  Administered 2017-06-04: 20 meq via ORAL
  Filled 2017-06-04: qty 1

## 2017-06-04 NOTE — H&P (View-Only) (Signed)
Subjective:  Feels well Complains of pain between his shoulder blades at rest. Unrelated to exertion. Patient has had this pain in the past.   Objective:  Vital Signs in the last 24 hours: Temp:  [97.9 F (36.6 C)-98.7 F (37.1 C)] 98.7 F (37.1 C) (03/13 0500) Pulse Rate:  [74-78] 78 (03/13 0500) Resp:  [16-18] 18 (03/13 0500) BP: (109-122)/(66-81) 122/66 (03/13 0500) SpO2:  [93 %-95 %] 95 % (03/13 0500) Weight:  [126.6 kg (279 lb 3.2 oz)] 126.6 kg (279 lb 3.2 oz) (03/13 0500)  Intake/Output from previous day: 03/12 0701 - 03/13 0700 In: 360 [P.O.:360] Out: -  Intake/Output from this shift: No intake/output data recorded.  Physical Exam: Nursing note and vitals reviewed. Constitutional: He is oriented to person, place, and time. He appears well-developed and well-nourished.  HENT:  Head: Normocephalic and atraumatic.  Eyes: Pupils are equal, round, and reactive to light.  Neck: Normal range of motion. Neck supple. No JVD present.  Cardiovascular: Normal rate.  Variable S1. Irregularly irregular rhythm  Respiratory: Effort normal and breath sounds normal. He has no wheezes. He has no rales.  GI: Soft. Bowel sounds are normal. There is no tenderness.  Musculoskeletal: Normal range of motion. He exhibits no edema.  Lymphadenopathy:    He has no cervical adenopathy.  Neurological: He is alert and oriented to person, place, and time.  Skin: Skin is warm and dry.  Psychiatric: He has a normal mood and affect.       Lab Results: Recent Labs    06/04/17 0826  WBC 5.7  HGB 14.1  PLT 209   Recent Labs    06/03/17 0434 06/04/17 0411  NA 139 138  K 4.4 3.6  CL 105 105  CO2 26 23  GLUCOSE 114* 111*  BUN 14 16  CREATININE 0.99 1.06    Cardiac Studies: EKG 06/03/2017: Atrial fibrillation controlled ventricular rate 70 bpm.  Normal axis.  QTc interval 434 msec  Echocardiogram 05/22/2017: Echocardiogram 05/22/2017: Left ventricle cavity is normal in size. Mild  concentric hypertrophy of the left ventricle. Normal global wall motion. Unable to evaluate diastolic function due to A. Fibrillation. Calculated EF 55%. Left atrial cavity is mildly dilated. Inadequate tricuspid regurgitation jet to estimate pulmonary artery pressure. Normal right atrial pressure.  Mildly dilated aortic root measuring 3.9 cm. No significant change compared to previous study dated 05/03/2015.  Assessment: Persistent atrial fibrillation CHA2DS2-VASc Score is 0 with yearly risk of stroke of 0%. OSA on CPAP hyperlipidemia Shoulder pain: Not angina, unlikely dissection with normal exam. Likely musculoskeletal  Plan: Given low bleeding risk. Reasonable to treat with anticoagulation. Currently on apixaban 5 mg bid. Tikosyn 500 g twice daily. Keep K around 4, mag around 2. Regular EKG monitoring for QTc interval. Tylenol prn for shoulder pain Cardioversion 06/05/17 8 AM     LOS: 2 days    Manish J Patwardhan 06/04/2017, 10:31 AM  Manish J Patwardhan, MD [facility] Pager: [phone] Office: [phone] If no answer Cell [phone]    

## 2017-06-04 NOTE — Progress Notes (Signed)
Subjective:  Feels well Complains of pain between his shoulder blades at rest. Unrelated to exertion. Patient has had this pain in the past.   Objective:  Vital Signs in the last 24 hours: Temp:  [97.9 F (36.6 C)-98.7 F (37.1 C)] 98.7 F (37.1 C) (03/13 0500) Pulse Rate:  [74-78] 78 (03/13 0500) Resp:  [16-18] 18 (03/13 0500) BP: (109-122)/(66-81) 122/66 (03/13 0500) SpO2:  [93 %-95 %] 95 % (03/13 0500) Weight:  [126.6 kg (279 lb 3.2 oz)] 126.6 kg (279 lb 3.2 oz) (03/13 0500)  Intake/Output from previous day: 03/12 0701 - 03/13 0700 In: 360 [P.O.:360] Out: -  Intake/Output from this shift: No intake/output data recorded.  Physical Exam: Nursing note and vitals reviewed. Constitutional: He is oriented to person, place, and time. He appears well-developed and well-nourished.  HENT:  Head: Normocephalic and atraumatic.  Eyes: Pupils are equal, round, and reactive to light.  Neck: Normal range of motion. Neck supple. No JVD present.  Cardiovascular: Normal rate.  Variable S1. Irregularly irregular rhythm  Respiratory: Effort normal and breath sounds normal. He has no wheezes. He has no rales.  GI: Soft. Bowel sounds are normal. There is no tenderness.  Musculoskeletal: Normal range of motion. He exhibits no edema.  Lymphadenopathy:    He has no cervical adenopathy.  Neurological: He is alert and oriented to person, place, and time.  Skin: Skin is warm and dry.  Psychiatric: He has a normal mood and affect.       Lab Results: Recent Labs    06/04/17 0826  WBC 5.7  HGB 14.1  PLT 209   Recent Labs    06/03/17 0434 06/04/17 0411  NA 139 138  K 4.4 3.6  CL 105 105  CO2 26 23  GLUCOSE 114* 111*  BUN 14 16  CREATININE 0.99 1.06    Cardiac Studies: EKG 06/03/2017: Atrial fibrillation controlled ventricular rate 70 bpm.  Normal axis.  QTc interval 434 msec  Echocardiogram 05/22/2017: Echocardiogram 05/22/2017: Left ventricle cavity is normal in size. Mild  concentric hypertrophy of the left ventricle. Normal global wall motion. Unable to evaluate diastolic function due to A. Fibrillation. Calculated EF 55%. Left atrial cavity is mildly dilated. Inadequate tricuspid regurgitation jet to estimate pulmonary artery pressure. Normal right atrial pressure.  Mildly dilated aortic root measuring 3.9 cm. No significant change compared to previous study dated 05/03/2015.  Assessment: Persistent atrial fibrillation CHA2DS2-VASc Score is 0 with yearly risk of stroke of 0%. OSA on CPAP hyperlipidemia Shoulder pain: Not angina, unlikely dissection with normal exam. Likely musculoskeletal  Plan: Given low bleeding risk. Reasonable to treat with anticoagulation. Currently on apixaban 5 mg bid. Tikosyn 500 g twice daily. Keep K around 4, mag around 2. Regular EKG monitoring for QTc interval. Tylenol prn for shoulder pain Cardioversion 06/05/17 8 AM     LOS: 2 days    Kialee Kham J Braelyn Bordonaro 06/04/2017, 10:31 AM  Glenna Brunkow Emiliano DyerJ Ladawna Walgren, MD [facility] Pager: [phone] Office: [phone] If no answer Cell [phone]

## 2017-06-05 ENCOUNTER — Inpatient Hospital Stay (HOSPITAL_COMMUNITY): Payer: BLUE CROSS/BLUE SHIELD | Admitting: Certified Registered"

## 2017-06-05 ENCOUNTER — Other Ambulatory Visit: Payer: Self-pay | Admitting: Neurology

## 2017-06-05 ENCOUNTER — Encounter (HOSPITAL_COMMUNITY): Payer: Self-pay | Admitting: *Deleted

## 2017-06-05 ENCOUNTER — Encounter (HOSPITAL_COMMUNITY)
Admission: RE | Disposition: A | Discharge status: Home / Self Care | Payer: Self-pay | Source: Ambulatory Visit | Attending: Cardiology

## 2017-06-05 DIAGNOSIS — G629 Polyneuropathy, unspecified: Secondary | ICD-10-CM

## 2017-06-05 DIAGNOSIS — E538 Deficiency of other specified B group vitamins: Secondary | ICD-10-CM | POA: Diagnosis not present

## 2017-06-05 DIAGNOSIS — E559 Vitamin D deficiency, unspecified: Secondary | ICD-10-CM | POA: Diagnosis not present

## 2017-06-05 DIAGNOSIS — G621 Alcoholic polyneuropathy: Secondary | ICD-10-CM | POA: Diagnosis not present

## 2017-06-05 HISTORY — PX: CARDIOVERSION: SHX1299

## 2017-06-05 LAB — BASIC METABOLIC PANEL
ANION GAP: 9 (ref 5–15)
BUN: 13 mg/dL (ref 6–20)
CALCIUM: 9.5 mg/dL (ref 8.9–10.3)
CHLORIDE: 105 mmol/L (ref 101–111)
CO2: 27 mmol/L (ref 22–32)
Creatinine, Ser: 0.94 mg/dL (ref 0.61–1.24)
GFR calc non Af Amer: >60 mL/min (ref >60)
Glucose, Bld: 105 mg/dL — ABNORMAL HIGH (ref 65–99)
Potassium: 4.1 mmol/L (ref 3.5–5.1)
Sodium: 141 mmol/L (ref 135–145)

## 2017-06-05 LAB — MAGNESIUM: Magnesium: 1.8 mg/dL (ref 1.7–2.4)

## 2017-06-05 SURGERY — CARDIOVERSION
Anesthesia: General

## 2017-06-05 MED ORDER — PROPOFOL 10 MG/ML IV BOLUS
INTRAVENOUS | Status: DC | PRN
  Administered 2017-06-05: 100 mg via INTRAVENOUS

## 2017-06-05 MED ORDER — HYDROCORTISONE 0.5 % EX CREA: 1.0000 "application " | TOPICAL_CREAM | Freq: Three times a day (TID) | CUTANEOUS | 0 refills | Status: DC | PRN

## 2017-06-05 MED ORDER — DOFETILIDE 500 MCG PO CAPS: 500.0000 ug | ORAL_CAPSULE | Freq: Two times a day (BID) | ORAL | 3 refills | Status: DC

## 2017-06-05 MED ORDER — LIDOCAINE HCL (CARDIAC) 20 MG/ML IV SOLN
INTRAVENOUS | Status: DC | PRN
  Administered 2017-06-05: 100 mg via INTRAVENOUS

## 2017-06-05 NOTE — Anesthesia Preprocedure Evaluation (Addendum)
Anesthesia Evaluation  Patient identified by MRN, date of birth, ID band Patient awake    Reviewed: Allergy & Precautions, NPO status , Patient's Chart, lab work & pertinent test results  Airway Mallampati: II  TM Distance: >3 FB Neck ROM: Full    Dental no notable dental hx.    Pulmonary sleep apnea , former smoker,    breath sounds clear to auscultation       Cardiovascular + dysrhythmias Atrial Fibrillation  Rhythm:Irregular Rate:Normal     Neuro/Psych    GI/Hepatic   Endo/Other  Morbid obesity  Renal/GU      Musculoskeletal   Abdominal (+) + obese,   Peds  Hematology   Anesthesia Other Findings   Reproductive/Obstetrics                            Anesthesia Physical Anesthesia Plan  ASA: III  Anesthesia Plan: General   Post-op Pain Management:    Induction: Intravenous  PONV Risk Score and Plan: 2 and Treatment may vary due to age or medical condition  Airway Management Planned: Mask  Additional Equipment:   Intra-op Plan:   Post-operative Plan:   Informed Consent: I have reviewed the patients History and Physical, chart, labs and discussed the procedure including the risks, benefits and alternatives for the proposed anesthesia with the patient or authorized representative who has indicated his/her understanding and acceptance.   Dental advisory given  Plan Discussed with: CRNA  Anesthesia Plan Comments:         Anesthesia Quick Evaluation

## 2017-06-05 NOTE — Transfer of Care (Signed)
Immediate Anesthesia Transfer of Care Note  Patient: Craig Morgan  Procedure(s) Performed: CARDIOVERSION (N/A )  Patient Location: Endoscopy Unit  Anesthesia Type:MAC  Level of Consciousness: awake, alert , oriented and patient cooperative  Airway & Oxygen Therapy: Patient Spontanous Breathing and Patient connected to nasal cannula oxygen  Post-op Assessment: Report given to RN and Post -op Vital signs reviewed and stable  Post vital signs: Reviewed and stable  Last Vitals:  Vitals:   06/05/17 0500 06/05/17 0726  BP: 120/74 123/71  Pulse: 73 79  Resp: 18 17  Temp: 36.7 C 36.5 C  SpO2: 97% 96%    Last Pain:  Vitals:   06/05/17 0726  TempSrc: Oral  PainSc:       Patients Stated Pain Goal: 0 (20/94/70 9628)  Complications: No apparent anesthesia complications

## 2017-06-05 NOTE — Progress Notes (Signed)
Pt came back from Endo.  A/O x4. Denies pain. Stable ambulation. Discharge instruction was given to pt. Pt states he does not want to eat BF.  Hinton DyerYoko Makaelyn Aponte, RN

## 2017-06-05 NOTE — Progress Notes (Signed)
Patient admitted to Endo for cardioversion. Reviewing medications with patient and patient unsure if he took am dose of Eliquis on Monday, March 11th. Spoke with Dr. Rosemary HolmsPatwardhan and ok to proceed with cardioversion. Patient made aware.

## 2017-06-05 NOTE — Anesthesia Postprocedure Evaluation (Signed)
Anesthesia Post Note  Patient: Craig Morgan  Procedure(s) Performed: CARDIOVERSION (N/A )     Patient location during evaluation: PACU Anesthesia Type: General Level of consciousness: awake and alert Pain management: pain level controlled Vital Signs Assessment: post-procedure vital signs reviewed and stable Respiratory status: spontaneous breathing, nonlabored ventilation, respiratory function stable and patient connected to nasal cannula oxygen Cardiovascular status: blood pressure returned to baseline and stable Postop Assessment: no apparent nausea or vomiting Anesthetic complications: no    Last Vitals:  Vitals:   06/05/17 0827 06/05/17 0830  BP:  100/63  Pulse: 68 69  Resp: 19 16  Temp:    SpO2: 95% 94%    Last Pain:  Vitals:   06/05/17 0726  TempSrc: Oral  PainSc:                  Lowella CurbWarren Ray Miller

## 2017-06-05 NOTE — CV Procedure (Signed)
Direct current cardioversion:  Indication symptomatic A. Fibrillation.  Procedure: Under deep sedation provided by anesthesiology, synchronized direct current cardioversion performed. Patient was delivered with incremental 120, 150,  and 200 Joules of electricity with success to NSR. Patient tolerated the procedure well. No immediate complication noted.   Elder NegusManish J Addilynn Mowrer, MD [facility] Pager: [phone] Office: [phone] If no answer Cell [phone]

## 2017-06-05 NOTE — Discharge Summary (Addendum)
Physician Discharge Summary  Patient ID: Craig Morgan MRN: [id number] DOB/AGE: 1955/06/09 62 y.o.  Admit date: 06/02/2017 Discharge date: 06/05/2017  Admission Diagnoses: Persistent Afib  Discharge Diagnoses:  Principal Problem:   Paroxysmal atrial fibrillation [facility](HCC)   Discharged Condition: good  Hospital Course:    62 year old Caucasian male with symptomatic persistent atrial fibrillation, prior unsuccessful cardioversion, admitted for tikosyn loading. Currently, denies any chest pain or shortness of breath. He has occasional left shoulder pain unrelated to exertion.   After adequate tikosyn loading, patient successfully converted to sinus rhythm after 3 shocks, highest 200 J on 06/05/2017. He should continue Eliquis and tikosyn, along with K and Mg replacement.   Consults: None  Significant Diagnostic Studies: labs:  Results for Craig Morgan, Craig Morgan (MRN [id number]) as of 06/05/2017 08:21  Ref. Range 06/04/2017 08:26  WBC Latest Ref Range: 4.0 - 10.5 K/uL 5.7  RBC Latest Ref Range: 4.22 - 5.81 MIL/uL 4.20 (Morgan)  Hemoglobin Latest Ref Range: 13.0 - 17.0 g/dL 86.514.1  HCT Latest Ref Range: 39.0 - 52.0 % 42.3  MCV Latest Ref Range: 78.0 - 100.0 fL 100.7 (H)  MCH Latest Ref Range: 26.0 - 34.0 pg 33.6  MCHC Latest Ref Range: 30.0 - 36.0 g/dL 78.433.3  RDW Latest Ref Range: 11.5 - 15.5 % 14.2  Platelets Latest Ref Range: 150 - 400 K/uL 209  Neutrophils Latest Units: % 69  Lymphocytes Latest Units: % 21  Monocytes Relative Latest Units: % 9  Eosinophil Latest Units: % 1  Basophil Latest Units: % 0  NEUT# Latest Ref Range: 1.7 - 7.7 K/uL 4.0  Lymphocyte # Latest Ref Range: 0.7 - 4.0 K/uL 1.2  Monocyte # Latest Ref Range: 0.1 - 1.0 K/uL 0.5  Eosinophils Absolute Latest Ref Range: 0.0 - 0.7 K/uL 0.1  Basophils Absolute Latest Ref Range: 0.0 - 0.1 K/uL 0.0   Results for Craig Morgan, Craig Morgan (MRN [id number]) as of 06/05/2017 08:21  Ref. Range 06/05/2017 05:36  BASIC METABOLIC PANEL Unknown Rpt  (A)  Sodium Latest Ref Range: 135 - 145 mmol/Morgan 141  Potassium Latest Ref Range: 3.5 - 5.1 mmol/Morgan 4.1  Chloride Latest Ref Range: 101 - 111 mmol/Morgan 105  CO2 Latest Ref Range: 22 - 32 mmol/Morgan 27  Glucose Latest Ref Range: 65 - 99 mg/dL 132105 (H)  BUN Latest Ref Range: 6 - 20 mg/dL 13  Creatinine Latest Ref Range: 0.61 - 1.24 mg/dL 4.400.94  Calcium Latest Ref Range: 8.9 - 10.3 mg/dL 9.5  Anion gap Latest Ref Range: 5 - 15  9  Magnesium Latest Ref Range: 1.7 - 2.4 mg/dL 1.8  GFR, Est Non African American Latest Ref Range: >60 mL/min >60  GFR, Est African American Latest Ref Range: >60 mL/min >60    Treatments: Successful cardioversion  Discharge Exam: Blood pressure 108/68, pulse 71, temperature 97.7 F (36.5 C), temperature source Oral, resp. rate (!) 24, height 6\' 1"  (1.854 m), weight 126.6 kg (279 lb 3.2 oz), SpO2 92 %.  Nursing note and vitals reviewed. Constitutional: He is oriented to person, place, and time. He appears well-developed and well-nourished.  HENT:  Head: Normocephalic and atraumatic.  Eyes: Pupils are equal, round, and reactive to light.  Neck: Normal range of motion. Neck supple. No JVD present.  Cardiovascular: Normal rate.  Regular rhythm. S1,S2 normal Respiratory: Effort normal and breath sounds normal. He has no wheezes. He has no rales.  GI: Soft. Bowel sounds are normal. There is no tenderness.  Musculoskeletal: Normal range of motion. He  exhibits no edema.  Lymphadenopathy:    He has no cervical adenopathy.  Neurological: He is alert and oriented to person, place, and time.  Skin: Skin is warm and dry.  Psychiatric: He has a normal mood and affect.     Disposition: 01-Home or Self Care  Discharge Instructions    Diet - low sodium heart healthy   Complete by:  As directed    Increase activity slowly   Complete by:  As directed      Allergies as of 06/05/2017      Reactions   Penicillins Hives   Has patient had a PCN reaction causing immediate rash,  facial/tongue/throat swelling, SOB or lightheadedness with hypotension: Yes Has patient had a PCN reaction causing severe rash involving mucus membranes or skin necrosis: Unknown Has patient had a PCN reaction that required hospitalization: Unknown Has patient had a PCN reaction occurring within the last 10 years: No If all of the above answers are "NO", then may proceed with Cephalosporin use.      Medication List    TAKE these medications   dofetilide 500 MCG capsule Commonly known as:  TIKOSYN Take 1 capsule (500 mcg total) by mouth 2 (two) times daily.   ELIQUIS 5 MG Tabs tablet Generic drug:  apixaban Take 1 tablet by mouth 2 (two) times daily.   hydrocortisone cream 0.5 % Apply 1 application topically every 8 (eight) hours as needed for itching.   magnesium oxide 400 MG tablet Commonly known as:  MAG-OX Take 400 mg by mouth daily.   omeprazole 20 MG capsule Commonly known as:  PRILOSEC Take 20 mg by mouth daily.   OXcarbazepine 150 MG tablet Commonly known as:  TRILEPTAL Take 1 tablet (150 mg total) by mouth 2 (two) times daily.   rOPINIRole 4 MG tablet Commonly known as:  REQUIP Take 4 mg by mouth 3 (three) times daily as needed (RLS).        SignedElder Negus 06/05/2017, 8:18 AM   Elder Negus, MD [facility] [place] Pager: [phone] Office: [phone] If no answer Cell [phone]

## 2017-06-05 NOTE — Interval H&P Note (Signed)
History and Physical Interval Note:  06/05/2017 7:59 AM  Craig Morgan  has presented today for surgery, with the diagnosis of a fib  The various methods of treatment have been discussed with the patient and family. After consideration of risks, benefits and other options for treatment, the patient has consented to  Procedure(s): CARDIOVERSION (N/A) as a surgical intervention .  The patient's history has been reviewed, patient examined, no change in status, stable for surgery.  I have reviewed the patient's chart and labs.  Questions were answered to the patient's satisfaction.     Asuncion Shibata J Almee Pelphrey

## 2017-06-06 DIAGNOSIS — G621 Alcoholic polyneuropathy: Secondary | ICD-10-CM | POA: Diagnosis not present

## 2017-06-06 DIAGNOSIS — G629 Polyneuropathy, unspecified: Secondary | ICD-10-CM | POA: Diagnosis not present

## 2017-06-09 DIAGNOSIS — G621 Alcoholic polyneuropathy: Secondary | ICD-10-CM | POA: Diagnosis not present

## 2017-06-12 ENCOUNTER — Ambulatory Visit: Payer: BLUE CROSS/BLUE SHIELD

## 2017-06-12 DIAGNOSIS — Z9889 Other specified postprocedural states: Secondary | ICD-10-CM | POA: Diagnosis not present

## 2017-06-12 DIAGNOSIS — E538 Deficiency of other specified B group vitamins: Secondary | ICD-10-CM | POA: Diagnosis not present

## 2017-06-12 DIAGNOSIS — G4733 Obstructive sleep apnea (adult) (pediatric): Secondary | ICD-10-CM | POA: Diagnosis not present

## 2017-06-12 DIAGNOSIS — K219 Gastro-esophageal reflux disease without esophagitis: Secondary | ICD-10-CM | POA: Diagnosis not present

## 2017-06-12 DIAGNOSIS — I482 Chronic atrial fibrillation: Secondary | ICD-10-CM | POA: Diagnosis not present

## 2017-06-16 DIAGNOSIS — G621 Alcoholic polyneuropathy: Secondary | ICD-10-CM | POA: Diagnosis not present

## 2017-06-18 ENCOUNTER — Other Ambulatory Visit: Payer: Self-pay | Admitting: Neurology

## 2017-06-18 DIAGNOSIS — E220 Acromegaly and pituitary gigantism: Secondary | ICD-10-CM

## 2017-06-18 DIAGNOSIS — R200 Anesthesia of skin: Secondary | ICD-10-CM | POA: Diagnosis not present

## 2017-06-18 DIAGNOSIS — G621 Alcoholic polyneuropathy: Secondary | ICD-10-CM | POA: Diagnosis not present

## 2017-06-19 DIAGNOSIS — Z79899 Other long term (current) drug therapy: Secondary | ICD-10-CM | POA: Diagnosis not present

## 2017-06-19 DIAGNOSIS — E538 Deficiency of other specified B group vitamins: Secondary | ICD-10-CM | POA: Diagnosis not present

## 2017-06-26 DIAGNOSIS — E538 Deficiency of other specified B group vitamins: Secondary | ICD-10-CM | POA: Diagnosis not present

## 2017-06-27 ENCOUNTER — Ambulatory Visit
Admission: RE | Admit: 2017-06-27 | Discharge: 2017-06-27 | Disposition: A | Discharge status: Home / Self Care | Payer: BLUE CROSS/BLUE SHIELD | Source: Ambulatory Visit | Attending: Neurology | Admitting: Neurology

## 2017-06-27 DIAGNOSIS — M5136 Other intervertebral disc degeneration, lumbar region: Secondary | ICD-10-CM | POA: Insufficient documentation

## 2017-06-27 DIAGNOSIS — M48061 Spinal stenosis, lumbar region without neurogenic claudication: Secondary | ICD-10-CM | POA: Insufficient documentation

## 2017-06-27 DIAGNOSIS — E220 Acromegaly and pituitary gigantism: Secondary | ICD-10-CM | POA: Diagnosis not present

## 2017-06-27 DIAGNOSIS — M5126 Other intervertebral disc displacement, lumbar region: Secondary | ICD-10-CM | POA: Insufficient documentation

## 2017-06-27 DIAGNOSIS — M4807 Spinal stenosis, lumbosacral region: Secondary | ICD-10-CM | POA: Insufficient documentation

## 2017-06-27 DIAGNOSIS — M4316 Spondylolisthesis, lumbar region: Secondary | ICD-10-CM | POA: Diagnosis not present

## 2017-06-27 DIAGNOSIS — G629 Polyneuropathy, unspecified: Secondary | ICD-10-CM | POA: Insufficient documentation

## 2017-06-27 DIAGNOSIS — Z7689 Persons encountering health services in other specified circumstances: Secondary | ICD-10-CM | POA: Diagnosis not present

## 2017-06-27 MED ORDER — GADOBENATE DIMEGLUMINE 529 MG/ML IV SOLN
10.0000 mL | Freq: Once | INTRAVENOUS | Status: AC | PRN
  Administered 2017-06-27: 10 mL via INTRAVENOUS

## 2017-07-03 DIAGNOSIS — E538 Deficiency of other specified B group vitamins: Secondary | ICD-10-CM | POA: Diagnosis not present

## 2017-07-03 DIAGNOSIS — G5602 Carpal tunnel syndrome, left upper limb: Secondary | ICD-10-CM | POA: Diagnosis not present

## 2017-09-01 DIAGNOSIS — M5442 Lumbago with sciatica, left side: Secondary | ICD-10-CM | POA: Diagnosis not present

## 2017-09-01 DIAGNOSIS — M9903 Segmental and somatic dysfunction of lumbar region: Secondary | ICD-10-CM | POA: Diagnosis not present

## 2017-09-02 DIAGNOSIS — M9903 Segmental and somatic dysfunction of lumbar region: Secondary | ICD-10-CM | POA: Diagnosis not present

## 2017-09-02 DIAGNOSIS — M5442 Lumbago with sciatica, left side: Secondary | ICD-10-CM | POA: Diagnosis not present

## 2017-09-03 DIAGNOSIS — M9901 Segmental and somatic dysfunction of cervical region: Secondary | ICD-10-CM | POA: Diagnosis not present

## 2017-09-03 DIAGNOSIS — M9903 Segmental and somatic dysfunction of lumbar region: Secondary | ICD-10-CM | POA: Diagnosis not present

## 2017-09-04 DIAGNOSIS — M9901 Segmental and somatic dysfunction of cervical region: Secondary | ICD-10-CM | POA: Diagnosis not present

## 2017-09-04 DIAGNOSIS — M9903 Segmental and somatic dysfunction of lumbar region: Secondary | ICD-10-CM | POA: Diagnosis not present

## 2017-09-08 DIAGNOSIS — G2581 Restless legs syndrome: Secondary | ICD-10-CM | POA: Diagnosis not present

## 2017-09-08 DIAGNOSIS — M5416 Radiculopathy, lumbar region: Secondary | ICD-10-CM | POA: Diagnosis not present

## 2017-09-08 DIAGNOSIS — G621 Alcoholic polyneuropathy: Secondary | ICD-10-CM | POA: Diagnosis not present

## 2017-09-08 DIAGNOSIS — G5603 Carpal tunnel syndrome, bilateral upper limbs: Secondary | ICD-10-CM | POA: Diagnosis not present

## 2017-09-12 DIAGNOSIS — M5136 Other intervertebral disc degeneration, lumbar region: Secondary | ICD-10-CM | POA: Diagnosis not present

## 2017-09-12 DIAGNOSIS — M5416 Radiculopathy, lumbar region: Secondary | ICD-10-CM | POA: Diagnosis not present

## 2017-09-12 DIAGNOSIS — Z7689 Persons encountering health services in other specified circumstances: Secondary | ICD-10-CM | POA: Diagnosis not present

## 2017-09-12 DIAGNOSIS — M47816 Spondylosis without myelopathy or radiculopathy, lumbar region: Secondary | ICD-10-CM | POA: Diagnosis not present

## 2017-09-15 DIAGNOSIS — G5601 Carpal tunnel syndrome, right upper limb: Secondary | ICD-10-CM | POA: Insufficient documentation

## 2017-09-15 DIAGNOSIS — G5603 Carpal tunnel syndrome, bilateral upper limbs: Secondary | ICD-10-CM | POA: Diagnosis not present

## 2017-09-15 DIAGNOSIS — G5602 Carpal tunnel syndrome, left upper limb: Secondary | ICD-10-CM | POA: Insufficient documentation

## 2017-09-17 ENCOUNTER — Other Ambulatory Visit: Payer: Self-pay

## 2017-09-17 ENCOUNTER — Encounter: Payer: Self-pay | Admitting: *Deleted

## 2017-09-24 ENCOUNTER — Encounter
Admission: RE | Disposition: A | Discharge status: Home / Self Care | Payer: Self-pay | Source: Ambulatory Visit | Attending: Surgery

## 2017-09-24 ENCOUNTER — Ambulatory Visit: Payer: BLUE CROSS/BLUE SHIELD | Admitting: Anesthesiology

## 2017-09-24 ENCOUNTER — Ambulatory Visit
Admission: RE | Admit: 2017-09-24 | Discharge: 2017-09-24 | Disposition: A | Discharge status: Home / Self Care | Payer: BLUE CROSS/BLUE SHIELD | Source: Ambulatory Visit | Attending: Surgery | Admitting: Surgery

## 2017-09-24 DIAGNOSIS — I4891 Unspecified atrial fibrillation: Secondary | ICD-10-CM | POA: Diagnosis not present

## 2017-09-24 DIAGNOSIS — Z7901 Long term (current) use of anticoagulants: Secondary | ICD-10-CM | POA: Diagnosis not present

## 2017-09-24 DIAGNOSIS — Z8249 Family history of ischemic heart disease and other diseases of the circulatory system: Secondary | ICD-10-CM | POA: Insufficient documentation

## 2017-09-24 DIAGNOSIS — M1990 Unspecified osteoarthritis, unspecified site: Secondary | ICD-10-CM | POA: Diagnosis not present

## 2017-09-24 DIAGNOSIS — Z79899 Other long term (current) drug therapy: Secondary | ICD-10-CM | POA: Insufficient documentation

## 2017-09-24 DIAGNOSIS — Z9989 Dependence on other enabling machines and devices: Secondary | ICD-10-CM | POA: Insufficient documentation

## 2017-09-24 DIAGNOSIS — G4730 Sleep apnea, unspecified: Secondary | ICD-10-CM | POA: Diagnosis not present

## 2017-09-24 DIAGNOSIS — K219 Gastro-esophageal reflux disease without esophagitis: Secondary | ICD-10-CM | POA: Insufficient documentation

## 2017-09-24 DIAGNOSIS — Z880 Allergy status to penicillin: Secondary | ICD-10-CM | POA: Diagnosis not present

## 2017-09-24 DIAGNOSIS — G5602 Carpal tunnel syndrome, left upper limb: Secondary | ICD-10-CM | POA: Insufficient documentation

## 2017-09-24 HISTORY — DX: Family history of other specified conditions: Z84.89

## 2017-09-24 HISTORY — PX: CARPAL TUNNEL RELEASE: SHX101

## 2017-09-24 SURGERY — RELEASE, CARPAL TUNNEL, ENDOSCOPIC
Anesthesia: Regional | Site: Hand | Laterality: Left | Wound class: Clean

## 2017-09-24 MED ORDER — LACTATED RINGERS IV SOLN
INTRAVENOUS | Status: DC
  Administered 2017-09-24: 11:00:00 via INTRAVENOUS

## 2017-09-24 MED ORDER — PROPOFOL 500 MG/50ML IV EMUL
INTRAVENOUS | Status: DC | PRN
  Administered 2017-09-24: 50 ug/kg/min via INTRAVENOUS

## 2017-09-24 MED ORDER — POTASSIUM CHLORIDE IN NACL 20-0.9 MEQ/L-% IV SOLN: INTRAVENOUS | Status: DC

## 2017-09-24 MED ORDER — MIDAZOLAM HCL 5 MG/5ML IJ SOLN
INTRAMUSCULAR | Status: DC | PRN
  Administered 2017-09-24: 2 mg via INTRAVENOUS

## 2017-09-24 MED ORDER — DEXMEDETOMIDINE HCL 200 MCG/2ML IV SOLN
INTRAVENOUS | Status: DC | PRN
  Administered 2017-09-24 (×2): 5 ug via INTRAVENOUS

## 2017-09-24 MED ORDER — METOCLOPRAMIDE HCL 5 MG PO TABS: 5.0000 mg | ORAL_TABLET | Freq: Three times a day (TID) | ORAL | Status: DC | PRN

## 2017-09-24 MED ORDER — ACETAMINOPHEN 160 MG/5ML PO SOLN: 325.0000 mg | ORAL | Status: DC | PRN

## 2017-09-24 MED ORDER — BUPIVACAINE HCL (PF) 0.5 % IJ SOLN
INTRAMUSCULAR | Status: DC | PRN
  Administered 2017-09-24: 10 mL

## 2017-09-24 MED ORDER — SODIUM CHLORIDE 0.9 % IV SOLN: 900.0000 mg | Freq: Once | INTRAVENOUS | Status: DC

## 2017-09-24 MED ORDER — PROMETHAZINE HCL 25 MG/ML IJ SOLN: 6.2500 mg | INTRAMUSCULAR | Status: DC | PRN

## 2017-09-24 MED ORDER — OXYCODONE HCL 5 MG PO TABS
5.0000 mg | ORAL_TABLET | Freq: Once | ORAL | Status: AC | PRN
  Administered 2017-09-24: 5 mg via ORAL

## 2017-09-24 MED ORDER — FENTANYL CITRATE (PF) 100 MCG/2ML IJ SOLN
25.0000 ug | INTRAMUSCULAR | Status: DC | PRN
  Administered 2017-09-24 (×2): 50 ug via INTRAVENOUS

## 2017-09-24 MED ORDER — ACETAMINOPHEN 325 MG PO TABS: 325.0000 mg | ORAL_TABLET | ORAL | Status: DC | PRN

## 2017-09-24 MED ORDER — ONDANSETRON HCL 4 MG PO TABS: 4.0000 mg | ORAL_TABLET | Freq: Four times a day (QID) | ORAL | Status: DC | PRN

## 2017-09-24 MED ORDER — OXYCODONE HCL 5 MG/5ML PO SOLN: 5.0000 mg | Freq: Once | ORAL | Status: AC | PRN

## 2017-09-24 MED ORDER — HYDROCODONE-ACETAMINOPHEN 5-325 MG PO TABS: 1.0000 | ORAL_TABLET | Freq: Four times a day (QID) | ORAL | 0 refills | Status: DC | PRN

## 2017-09-24 MED ORDER — HYDROCODONE-ACETAMINOPHEN 5-325 MG PO TABS: 1.0000 | ORAL_TABLET | ORAL | Status: DC | PRN

## 2017-09-24 MED ORDER — LIDOCAINE HCL (CARDIAC) PF 100 MG/5ML IV SOSY
PREFILLED_SYRINGE | INTRAVENOUS | Status: DC | PRN
  Administered 2017-09-24: 40 mg via INTRAVENOUS

## 2017-09-24 MED ORDER — LACTATED RINGERS IV SOLN
10.0000 mL/h | INTRAVENOUS | Status: DC
  Administered 2017-09-24: 12:00:00 via INTRAVENOUS
  Administered 2017-09-24: 10 mL/h via INTRAVENOUS

## 2017-09-24 MED ORDER — ONDANSETRON HCL 4 MG/2ML IJ SOLN
4.0000 mg | Freq: Four times a day (QID) | INTRAMUSCULAR | Status: DC | PRN
  Administered 2017-09-24: 4 mg via INTRAVENOUS

## 2017-09-24 MED ORDER — METOCLOPRAMIDE HCL 5 MG/ML IJ SOLN: 5.0000 mg | Freq: Three times a day (TID) | INTRAMUSCULAR | Status: DC | PRN

## 2017-09-24 SURGICAL SUPPLY — 26 items
BANDAGE ELASTIC 2 LF NS (GAUZE/BANDAGES/DRESSINGS) ×2 IMPLANT
BNDG CMPR MED 5X2 ELC HKLP NS (GAUZE/BANDAGES/DRESSINGS) ×1
BNDG COHESIVE 4X5 TAN STRL (GAUZE/BANDAGES/DRESSINGS) ×2 IMPLANT
BNDG ESMARK 4X12 TAN STRL LF (GAUZE/BANDAGES/DRESSINGS) ×2 IMPLANT
CHLORAPREP W/TINT 26ML (MISCELLANEOUS) ×2 IMPLANT
CORD BIP STRL DISP 12FT (MISCELLANEOUS) ×2 IMPLANT
COVER LIGHT HANDLE UNIVERSAL (MISCELLANEOUS) ×4 IMPLANT
CUFF TOURNIQUET DUAL PORT 18X3 (MISCELLANEOUS) ×2 IMPLANT
DRAPE SURG 17X11 SM STRL (DRAPES) ×2 IMPLANT
GAUZE PETRO XEROFOAM 1X8 (MISCELLANEOUS) ×2 IMPLANT
GAUZE SPONGE 4X4 12PLY STRL (GAUZE/BANDAGES/DRESSINGS) ×2 IMPLANT
GLOVE BIO SURGEON STRL SZ8 (GLOVE) ×2 IMPLANT
GLOVE INDICATOR 8.0 STRL GRN (GLOVE) ×2 IMPLANT
GOWN STRL REUS W/ TWL LRG LVL3 (GOWN DISPOSABLE) ×1 IMPLANT
GOWN STRL REUS W/ TWL XL LVL3 (GOWN DISPOSABLE) ×1 IMPLANT
GOWN STRL REUS W/TWL LRG LVL3 (GOWN DISPOSABLE) ×2
GOWN STRL REUS W/TWL XL LVL3 (GOWN DISPOSABLE) ×2
KIT CARPAL TUNNEL (MISCELLANEOUS) ×2
KIT ESCP INSRT D SLOT CANN KN (MISCELLANEOUS) ×1 IMPLANT
KIT TURNOVER KIT A (KITS) ×2 IMPLANT
NS IRRIG 500ML POUR BTL (IV SOLUTION) ×2 IMPLANT
PACK EXTREMITY ARMC (MISCELLANEOUS) ×2 IMPLANT
SPLINT WRIST/FOREARM XL LT TX9 (SOFTGOODS) ×1 IMPLANT
STOCKINETTE IMPERVIOUS 9X36 MD (GAUZE/BANDAGES/DRESSINGS) ×2 IMPLANT
STRAP BODY AND KNEE 60X3 (MISCELLANEOUS) ×2 IMPLANT
SUT PROLENE 4 0 PS 2 18 (SUTURE) ×2 IMPLANT

## 2017-09-24 NOTE — Anesthesia Procedure Notes (Signed)
Anesthesia Regional Block: Bier block (IV Regional)   Pre-Anesthetic Checklist: ,, timeout performed, Correct Patient, Correct Site, Correct Laterality, Correct Procedure, Correct Position, site marked, Risks and benefits discussed,  Surgical consent,  Pre-op evaluation,  At surgeon's request and post-op pain management  Laterality: Left      Procedures:,,,,, intact distal pulses, Esmarch exsanguination,, #20gu IV placed  Narrative:  Start time: 09/24/2017 11:57 AM End time: 09/24/2017 11:58 AM  Performed by: With CRNAs  Anesthesiologist: Jola BabinskiHarker, Elsje, MD CRNA: Jimmy PicketAmyot, Derian Pfost, CRNA  Additional Notes: Double tourniquet applied to L upper arm. Esmarch exsanguination without difficulty. B tourniquets inflated.Negative L radial pulse noted. Distal cuff deflated. 50mL 0.5% Plain Lidocaine injected without difficulty. 20g angio to L hand d/c'd. Pt tolerated procedure well.

## 2017-09-24 NOTE — H&P (Signed)
Paper H&P to be scanned into permanent record. H&P reviewed and patient re-examined. No changes. 

## 2017-09-24 NOTE — Op Note (Signed)
09/24/2017  12:32 PM  Patient:   Craig Morgan  Pre-Op Diagnosis:   Left carpal tunnel syndrome.  Post-Op Diagnosis:   Same.  Procedure:   Endoscopic left carpal tunnel release.  Surgeon:   Maryagnes AmosJ. Jeffrey Poggi, MD  Anesthesia:   Bier block  Findings:   As above.  Complications:   None  EBL:   0 cc  Fluids:   700 cc crystalloid  TT:   28 minutes at 250 mmHg  Drains:   None  Closure:   4-0 Prolene interrupted sutures  Brief Clinical Note:   The patient is a 62 year old male with a long history of bilateral hand and wrist pain and paresthesias, left more symptomatic than right. His history and examination are consistent with bilateral carpal tunnel syndrome, confirmed by EMG. The patient presents at this time for an endoscopic left carpal tunnel release.   Procedure:   The patient was brought into the operating room and lain in the supine position. After adequate IV sedation was achieved, a timeout was performed to verify the appropriate surgical site before a Bier block was placed by the anesthesiologist and the tourniquet inflated to 250 mmHg. The left hand and upper extremity were prepped with ChloraPrep solution before being draped sterilely. Preoperative antibiotics were administered. An approximately 1.5-2 cm incision was made over the volar wrist flexion crease, centered over the palmaris longus tendon. The incision was carried down through the subcutaneous tissues with care taken to identify and protect any neurovascular structures. The distal forearm fascia was penetrated just proximal to the transverse carpal ligament. The soft tissues were released off the superficial and deep surfaces of the distal forearm fascia and this was released proximally for 3-4 cm under direct visualization.  Attention was directed distally. The Therapist, nutritionalreer elevator was passed beneath the transverse carpal ligament along the ulnar aspect of the carpal tunnel and used to release any adhesions as well as to  remove any adherent synovial tissue before first the smaller then the larger of the two dilators were passed beneath the transverse carpal ligament along the ulnar margin of the carpal tunnel. The slotted cannula was introduced and the endoscope was placed into the slotted cannula and the undersurface of the transverse carpal ligament visualized. The distal margin of the transverse carpal ligament was marked by placing a 25-gauge needle percutaneously at Kaplan's cardinal point so that it entered the distal portion of the slotted cannula. Under endoscopic visualization, the transverse carpal ligament was released from proximal to distal using the end-cutting blade. A second pass was performed to ensure complete release of the ligament. The adequacy of release was verified both endoscopically and by palpation using the freer elevator.  The wound was irrigated thoroughly with sterile saline solution before being closed using 4-0 Prolene interrupted sutures. A total of 10 cc of 0.5% plain Sensorcaine was injected in and around the incision before a sterile bulky dressing was applied to the wound. The patient was placed into a volar wrist splint before being awakened and returned to the recovery room in satisfactory condition after tolerating the procedure well.

## 2017-09-24 NOTE — Discharge Instructions (Addendum)
General Anesthesia, Adult, Care After These instructions provide you with information about caring for yourself after your procedure. Your health care provider may also give you more specific instructions. Your treatment has been planned according to current medical practices, but problems sometimes occur. Call your health care provider if you have any problems or questions after your procedure. What can I expect after the procedure? After the procedure, it is common to have:  Vomiting.  A sore throat.  Mental slowness.  It is common to feel:  Nauseous.  Cold or shivery.  Sleepy.  Tired.  Sore or achy, even in parts of your body where you did not have surgery.  Follow these instructions at home: For at least 24 hours after the procedure:  Do not: ? Participate in activities where you could fall or become injured. ? Drive. ? Use heavy machinery. ? Drink alcohol. ? Take sleeping pills or medicines that cause drowsiness. ? Make important decisions or sign legal documents. ? Take care of children on your own.  Rest. Eating and drinking  If you vomit, drink water, juice, or soup when you can drink without vomiting.  Drink enough fluid to keep your urine clear or pale yellow.  Make sure you have little or no nausea before eating solid foods.  Follow the diet recommended by your health care provider. General instructions  Have a responsible adult stay with you until you are awake and alert.  Return to your normal activities as told by your health care provider. Ask your health care provider what activities are safe for you.  Take over-the-counter and prescription medicines only as told by your health care provider.  If you smoke, do not smoke without supervision.  Keep all follow-up visits as told by your health care provider. This is important. Contact a health care provider if:  You continue to have nausea or vomiting at home, and medicines are not helpful.  You  cannot drink fluids or start eating again.  You cannot urinate after 8-12 hours.  You develop a skin rash.  You have fever.  You have increasing redness at the site of your procedure. Get help right away if:  You have difficulty breathing.  You have chest pain.  You have unexpected bleeding.  You feel that you are having a life-threatening or urgent problem. This information is not intended to replace advice given to you by your health care provider. Make sure you discuss any questions you have with your health care provider. Document Released: 06/17/2000 Document Revised: 08/14/2015 Document Reviewed: 02/23/2015 Elsevier Interactive Patient Education  2018 [employer]   Orthopedic discharge instructions: Keep dressing dry and intact. Keep hand elevated above heart level. May shower after dressing removed on postop day 4 (Sunday). Cover sutures with Band-Aids after drying off, then reapply splint. Apply ice to affected area frequently. Take ibuprofen 600-800 mg TID with meals for 7-10 days, then as necessary. Take ES Tylenol or pain medication as prescribed when needed.  Return for follow-up in 10-14 days or as scheduled.

## 2017-09-24 NOTE — Transfer of Care (Signed)
Immediate Anesthesia Transfer of Care Note  Patient: Craig Morgan  Procedure(s) Performed: CARPAL TUNNEL RELEASE ENDOSCOPIC (Left Hand)  Patient Location: PACU  Anesthesia Type: General, Bier Block  Level of Consciousness: awake, alert  and patient cooperative  Airway and Oxygen Therapy: Patient Spontanous Breathing and Patient connected to supplemental oxygen  Post-op Assessment: Post-op Vital signs reviewed, Patient's Cardiovascular Status Stable, Respiratory Function Stable, Patent Airway and No signs of Nausea or vomiting  Post-op Vital Signs: Reviewed and stable  Complications: No apparent anesthesia complications

## 2017-09-24 NOTE — Anesthesia Postprocedure Evaluation (Signed)
Anesthesia Post Note  Patient: Craig Morgan  Procedure(s) Performed: CARPAL TUNNEL RELEASE ENDOSCOPIC (Left Hand)  Patient location during evaluation: PACU Anesthesia Type: Bier Block and General Level of consciousness: awake Pain management: pain level controlled Vital Signs Assessment: post-procedure vital signs reviewed and stable Respiratory status: respiratory function stable Cardiovascular status: stable Postop Assessment: no signs of nausea or vomiting Anesthetic complications: no    [name]

## 2017-09-24 NOTE — Anesthesia Preprocedure Evaluation (Addendum)
Anesthesia Evaluation  Patient identified by MRN, date of birth, ID band Patient awake    Reviewed: Allergy & Precautions, NPO status , Patient's Chart, lab work & pertinent test results  Airway Mallampati: II  TM Distance: >3 FB     Dental   Pulmonary sleep apnea and Continuous Positive Airway Pressure Ventilation , former smoker,    breath sounds clear to auscultation       Cardiovascular + dysrhythmias (atrial fibrillation)  Rhythm:Regular Rate:Normal     Neuro/Psych    GI/Hepatic GERD  Medicated,  Endo/Other  BMI 37   Renal/GU      Musculoskeletal  (+) Arthritis ,   Abdominal   Peds  Hematology   Anesthesia Other Findings   Reproductive/Obstetrics                            Anesthesia Physical Anesthesia Plan  ASA: III  Anesthesia Plan: General and Bier Block and Bier Block-LIDOCAINE ONLY   Post-op Pain Management:    Induction: Intravenous  PONV Risk Score and Plan:   Airway Management Planned: Simple Face Mask  Additional Equipment:   Intra-op Plan:   Post-operative Plan:   Informed Consent: I have reviewed the patients History and Physical, chart, labs and discussed the procedure including the risks, benefits and alternatives for the proposed anesthesia with the patient or authorized representative who has indicated his/her understanding and acceptance.     Plan Discussed with: CRNA  Anesthesia Plan Comments:        Anesthesia Quick Evaluation

## 2017-09-24 NOTE — Anesthesia Procedure Notes (Signed)
Procedure Name: MAC Performed by: Doreen Garretson, CRNA Pre-anesthesia Checklist: Patient identified, Emergency Drugs available, Suction available, Patient being monitored and Timeout performed Patient Re-evaluated:Patient Re-evaluated prior to induction Oxygen Delivery Method: Simple face mask Placement Confirmation: positive ETCO2 and breath sounds checked- equal and bilateral       

## 2017-10-06 DIAGNOSIS — G5601 Carpal tunnel syndrome, right upper limb: Secondary | ICD-10-CM | POA: Diagnosis not present

## 2017-10-06 DIAGNOSIS — Z9889 Other specified postprocedural states: Secondary | ICD-10-CM | POA: Diagnosis not present

## 2017-10-10 DIAGNOSIS — M5416 Radiculopathy, lumbar region: Secondary | ICD-10-CM | POA: Diagnosis not present

## 2017-10-10 DIAGNOSIS — M5136 Other intervertebral disc degeneration, lumbar region: Secondary | ICD-10-CM | POA: Diagnosis not present

## 2017-10-10 DIAGNOSIS — M48062 Spinal stenosis, lumbar region with neurogenic claudication: Secondary | ICD-10-CM | POA: Diagnosis not present

## 2017-10-22 ENCOUNTER — Other Ambulatory Visit: Payer: Self-pay

## 2017-10-22 ENCOUNTER — Encounter: Payer: Self-pay | Admitting: *Deleted

## 2017-10-29 ENCOUNTER — Ambulatory Visit: Payer: BLUE CROSS/BLUE SHIELD | Admitting: Anesthesiology

## 2017-10-29 ENCOUNTER — Ambulatory Visit
Admission: RE | Admit: 2017-10-29 | Discharge: 2017-10-29 | Disposition: A | Discharge status: Home / Self Care | Payer: BLUE CROSS/BLUE SHIELD | Source: Ambulatory Visit | Attending: Surgery | Admitting: Surgery

## 2017-10-29 ENCOUNTER — Encounter
Admission: RE | Disposition: A | Discharge status: Home / Self Care | Payer: Self-pay | Source: Ambulatory Visit | Attending: Surgery

## 2017-10-29 DIAGNOSIS — I4891 Unspecified atrial fibrillation: Secondary | ICD-10-CM | POA: Diagnosis not present

## 2017-10-29 DIAGNOSIS — K219 Gastro-esophageal reflux disease without esophagitis: Secondary | ICD-10-CM | POA: Diagnosis not present

## 2017-10-29 DIAGNOSIS — Z79899 Other long term (current) drug therapy: Secondary | ICD-10-CM | POA: Diagnosis not present

## 2017-10-29 DIAGNOSIS — R251 Tremor, unspecified: Secondary | ICD-10-CM | POA: Insufficient documentation

## 2017-10-29 DIAGNOSIS — G5601 Carpal tunnel syndrome, right upper limb: Secondary | ICD-10-CM | POA: Diagnosis not present

## 2017-10-29 DIAGNOSIS — Z87891 Personal history of nicotine dependence: Secondary | ICD-10-CM | POA: Diagnosis not present

## 2017-10-29 DIAGNOSIS — Z7901 Long term (current) use of anticoagulants: Secondary | ICD-10-CM | POA: Diagnosis not present

## 2017-10-29 DIAGNOSIS — G4730 Sleep apnea, unspecified: Secondary | ICD-10-CM | POA: Diagnosis not present

## 2017-10-29 HISTORY — PX: CARPAL TUNNEL RELEASE: SHX101

## 2017-10-29 SURGERY — CARPAL TUNNEL RELEASE
Anesthesia: Regional | Laterality: Right

## 2017-10-29 MED ORDER — MEPERIDINE HCL 25 MG/ML IJ SOLN: 6.2500 mg | INTRAMUSCULAR | Status: DC | PRN

## 2017-10-29 MED ORDER — MIDAZOLAM HCL 5 MG/5ML IJ SOLN
INTRAMUSCULAR | Status: DC | PRN
  Administered 2017-10-29: 2 mg via INTRAVENOUS

## 2017-10-29 MED ORDER — OXYCODONE HCL 5 MG/5ML PO SOLN: 5.0000 mg | Freq: Once | ORAL | Status: DC | PRN

## 2017-10-29 MED ORDER — CLINDAMYCIN PHOSPHATE 600 MG/4ML IJ SOLN
900.0000 mg | Freq: Once | INTRAMUSCULAR | Status: AC
  Administered 2017-10-29: 900 mg via INTRAVENOUS

## 2017-10-29 MED ORDER — PROPOFOL 500 MG/50ML IV EMUL
INTRAVENOUS | Status: DC | PRN
  Administered 2017-10-29: 100 ug/kg/min via INTRAVENOUS
  Administered 2017-10-29: 50 ug/kg/min via INTRAVENOUS

## 2017-10-29 MED ORDER — PROMETHAZINE HCL 25 MG/ML IJ SOLN: 6.2500 mg | INTRAMUSCULAR | Status: DC | PRN

## 2017-10-29 MED ORDER — LACTATED RINGERS IV SOLN
INTRAVENOUS | Status: DC
  Administered 2017-10-29: 12:00:00 via INTRAVENOUS

## 2017-10-29 MED ORDER — BUPIVACAINE HCL (PF) 0.5 % IJ SOLN
INTRAMUSCULAR | Status: DC | PRN
  Administered 2017-10-29: 10 mL

## 2017-10-29 MED ORDER — HYDROCODONE-ACETAMINOPHEN 5-325 MG PO TABS: 1.0000 | ORAL_TABLET | Freq: Four times a day (QID) | ORAL | 0 refills | Status: DC | PRN

## 2017-10-29 MED ORDER — FENTANYL CITRATE (PF) 100 MCG/2ML IJ SOLN: 25.0000 ug | INTRAMUSCULAR | Status: DC | PRN

## 2017-10-29 MED ORDER — FENTANYL CITRATE (PF) 100 MCG/2ML IJ SOLN
INTRAMUSCULAR | Status: DC | PRN
  Administered 2017-10-29: 50 ug via INTRAVENOUS

## 2017-10-29 MED ORDER — OXYCODONE HCL 5 MG PO TABS: 5.0000 mg | ORAL_TABLET | Freq: Once | ORAL | Status: DC | PRN

## 2017-10-29 SURGICAL SUPPLY — 28 items
BANDAGE ELASTIC 2 LF NS (GAUZE/BANDAGES/DRESSINGS) ×2 IMPLANT
BNDG CMPR MED 5X2 ELC HKLP NS (GAUZE/BANDAGES/DRESSINGS) ×1
BNDG COHESIVE 4X5 TAN STRL (GAUZE/BANDAGES/DRESSINGS) ×2 IMPLANT
BNDG ESMARK 4X12 TAN STRL LF (GAUZE/BANDAGES/DRESSINGS) ×2 IMPLANT
CHLORAPREP W/TINT 26ML (MISCELLANEOUS) ×2 IMPLANT
CORD BIP STRL DISP 12FT (MISCELLANEOUS) ×2 IMPLANT
COVER LIGHT HANDLE UNIVERSAL (MISCELLANEOUS) ×4 IMPLANT
CUFF TOURNIQUET DUAL PORT 18X3 (MISCELLANEOUS) ×2 IMPLANT
DRAPE SURG 17X11 SM STRL (DRAPES) ×2 IMPLANT
GAUZE PETRO XEROFOAM 1X8 (MISCELLANEOUS) ×2 IMPLANT
GAUZE SPONGE 4X4 12PLY STRL (GAUZE/BANDAGES/DRESSINGS) ×2 IMPLANT
GLOVE BIO SURGEON STRL SZ8 (GLOVE) ×2 IMPLANT
GLOVE INDICATOR 8.0 STRL GRN (GLOVE) ×2 IMPLANT
GOWN STRL REUS W/ TWL LRG LVL3 (GOWN DISPOSABLE) ×1 IMPLANT
GOWN STRL REUS W/ TWL XL LVL3 (GOWN DISPOSABLE) ×1 IMPLANT
GOWN STRL REUS W/TWL LRG LVL3 (GOWN DISPOSABLE) ×2
GOWN STRL REUS W/TWL XL LVL3 (GOWN DISPOSABLE) ×2
KIT CARPAL TUNNEL (MISCELLANEOUS) ×2
KIT ESCP INSRT D SLOT CANN KN (MISCELLANEOUS) ×1 IMPLANT
KIT TURNOVER KIT A (KITS) ×2 IMPLANT
NS IRRIG 500ML POUR BTL (IV SOLUTION) ×2 IMPLANT
PACK EXTREMITY ARMC (MISCELLANEOUS) ×2 IMPLANT
SPLINT WRIST/FOREARM XL RT TX9 (SOFTGOODS) ×1 IMPLANT
STOCKINETTE IMPERVIOUS 9X36 MD (GAUZE/BANDAGES/DRESSINGS) ×2 IMPLANT
STRAP BODY AND KNEE 60X3 (MISCELLANEOUS) ×2 IMPLANT
SUT PROLENE 4 0 PS 2 18 (SUTURE) ×2 IMPLANT
SUT VIC AB 3-0 SH 27 (SUTURE)
SUT VIC AB 3-0 SH 27X BRD (SUTURE) IMPLANT

## 2017-10-29 NOTE — Op Note (Signed)
10/29/2017  12:41 PM  Patient:   Craig Morgan  Pre-Op Diagnosis:   Right carpal tunnel syndrome.  Post-Op Diagnosis:   Same.  Procedure:   Endoscopic right carpal tunnel release.  Surgeon:   Maryagnes AmosJ. Jeffrey Stpehen Petitjean, MD  Anesthesia:   Bier block  Findings:   As above.  Complications:   None  EBL:   0 cc  Fluids:   300 cc crystalloid  TT:   28 minutes at 250 mmHg  Drains:   None  Closure:   4-0 Prolene interrupted sutures  Brief Clinical Note:   The patient is a 62 year old male with a long history of gradually worsening pain and paresthesias to his right hand and wrist. His symptoms have progressed despite medications, activity modification, etc. His history and examination are consistent with carpal tunnel syndrome confirmed by EMG. He recently has undergone an endoscopic left carpal tunnel release from which she has done quite well. The patient presents at this time for an endoscopic right carpal tunnel release.   Procedure:   The patient was brought into the operating room and lain in the supine position. After adequate IV sedation was achieved, a timeout was performed to verify the appropriate surgical site before a Bier block was placed by the anesthesiologist and the tourniquet inflated to 250 mmHg. The right hand and upper extremity were prepped with ChloraPrep solution before being draped sterilely. Preoperative antibiotics were administered. An approximately 1.5-2 cm incision was made over the volar wrist flexion crease, centered over the palmaris longus tendon. The incision was carried down through the subcutaneous tissues with care taken to identify and protect any neurovascular structures. The distal forearm fascia was penetrated just proximal to the transverse carpal ligament. The soft tissues were released off the superficial and deep surfaces of the distal forearm fascia and this was released proximally for 3-4 cm under direct visualization.  Attention was directed distally.  The Therapist, nutritionalreer elevator was passed beneath the transverse carpal ligament along the ulnar aspect of the carpal tunnel and used to release any adhesions as well as to remove any adherent synovial tissue before first the smaller then the larger of the two dilators were passed beneath the transverse carpal ligament along the ulnar margin of the carpal tunnel. The slotted cannula was introduced and the endoscope was placed into the slotted cannula and the undersurface of the transverse carpal ligament visualized. The distal margin of the transverse carpal ligament was marked by placing a 25-gauge needle percutaneously at Kaplan's cardinal point so that it entered the distal portion of the slotted cannula. Under endoscopic visualization, the transverse carpal ligament was released from proximal to distal using the end-cutting blade. A second pass was performed to ensure complete release of the ligament. The adequacy of release was verified both endoscopically and by palpation using the freer elevator.  The wound was irrigated thoroughly with sterile saline solution before being closed using 4-0 Prolene interrupted sutures. A total of 10 cc of 0.5% plain Sensorcaine was injected in and around the incision before a sterile bulky dressing was applied to the wound. The patient was placed into a volar wrist splint before being awakened and returned to the recovery room in satisfactory condition after tolerating the procedure well.

## 2017-10-29 NOTE — Transfer of Care (Signed)
Immediate Anesthesia Transfer of Care Note  Patient: Craig Morgan  Procedure(s) Performed: ENDOSCOPIC CARPAL TUNNEL RELEASE WRIST (Right )  Patient Location: PACU  Anesthesia Type: General, Bier Block  Level of Consciousness: awake, alert  and patient cooperative  Airway and Oxygen Therapy: Patient Spontanous Breathing and Patient connected to supplemental oxygen  Post-op Assessment: Post-op Vital signs reviewed, Patient's Cardiovascular Status Stable, Respiratory Function Stable, Patent Airway and No signs of Nausea or vomiting  Post-op Vital Signs: Reviewed and stable  Complications: No apparent anesthesia complications

## 2017-10-29 NOTE — Anesthesia Procedure Notes (Signed)
Date/Time: 10/29/2017 12:01 PM Performed by: Maree KrabbeWarren, Terald Jump, CRNA Pre-anesthesia Checklist: Patient identified, Emergency Drugs available, Suction available, Patient being monitored and Timeout performed Patient Re-evaluated:Patient Re-evaluated prior to induction Oxygen Delivery Method: Simple face mask Placement Confirmation: positive ETCO2 and breath sounds checked- equal and bilateral

## 2017-10-29 NOTE — Anesthesia Postprocedure Evaluation (Signed)
Anesthesia Post Note  Patient: Craig Morgan  Procedure(s) Performed: ENDOSCOPIC CARPAL TUNNEL RELEASE WRIST (Right )  Patient location during evaluation: PACU Anesthesia Type: Bier Block and General Level of consciousness: awake and alert Pain management: pain level controlled Vital Signs Assessment: post-procedure vital signs reviewed and stable Respiratory status: spontaneous breathing, nonlabored ventilation, respiratory function stable and patient connected to nasal cannula oxygen Cardiovascular status: blood pressure returned to baseline and stable Postop Assessment: no apparent nausea or vomiting Anesthetic complications: no    Chrystopher Stangl ELAINE

## 2017-10-29 NOTE — Anesthesia Preprocedure Evaluation (Signed)
Anesthesia Evaluation  Patient identified by MRN, date of birth, ID band Patient awake    Reviewed: Allergy & Precautions, NPO status , Patient's Chart, lab work & pertinent test results  Airway Mallampati: II  TM Distance: >3 FB     Dental   Pulmonary sleep apnea and Continuous Positive Airway Pressure Ventilation , former smoker,    breath sounds clear to auscultation       Cardiovascular + dysrhythmias (atrial fibrillation)  Rhythm:Regular Rate:Normal     Neuro/Psych    GI/Hepatic GERD  Medicated,  Endo/Other  BMI 37   Renal/GU      Musculoskeletal  (+) Arthritis ,   Abdominal   Peds  Hematology   Anesthesia Other Findings   Reproductive/Obstetrics                             Anesthesia Physical  Anesthesia Plan  ASA: III  Anesthesia Plan: General and Bier Block and Bier Block-LIDOCAINE ONLY   Post-op Pain Management:    Induction: Intravenous  PONV Risk Score and Plan:   Airway Management Planned: Simple Face Mask  Additional Equipment:   Intra-op Plan:   Post-operative Plan:   Informed Consent: I have reviewed the patients History and Physical, chart, labs and discussed the procedure including the risks, benefits and alternatives for the proposed anesthesia with the patient or authorized representative who has indicated his/her understanding and acceptance.     Plan Discussed with: CRNA  Anesthesia Plan Comments:         Anesthesia Quick Evaluation

## 2017-10-29 NOTE — Anesthesia Procedure Notes (Signed)
Anesthesia Regional Block: Bier block (IV Regional)   Pre-Anesthetic Checklist: ,, timeout performed, Correct Patient, Correct Site, Correct Laterality, Correct Procedure, Correct Position, site marked, Risks and benefits discussed, Surgical consent,  Pre-op evaluation,  At surgeon's request  Laterality: Right      Procedures:,,,,, intact distal pulses, Esmarch exsanguination,, #20gu IV placed and double tourniquet utilized  Narrative:   Performed by: With CRNAs  Anesthesiologist: Aldine ContesScouras, Midas Daughety Elaine, MD

## 2017-10-29 NOTE — H&P (Signed)
Paper H&P to be scanned into permanent record. H&P reviewed and patient re-examined. No changes. 

## 2017-10-29 NOTE — Discharge Instructions (Addendum)
General Anesthesia, Adult, Care After These instructions provide you with information about caring for yourself after your procedure. Your health care provider may also give you more specific instructions. Your treatment has been planned according to current medical practices, but problems sometimes occur. Call your health care provider if you have any problems or questions after your procedure. What can I expect after the procedure? After the procedure, it is common to have:  Vomiting.  A sore throat.  Mental slowness.  It is common to feel:  Nauseous.  Cold or shivery.  Sleepy.  Tired.  Sore or achy, even in parts of your body where you did not have surgery.  Follow these instructions at home: For at least 24 hours after the procedure:  Do not: ? Participate in activities where you could fall or become injured. ? Drive. ? Use heavy machinery. ? Drink alcohol. ? Take sleeping pills or medicines that cause drowsiness. ? Make important decisions or sign legal documents. ? Take care of children on your own.  Rest. Eating and drinking  If you vomit, drink water, juice, or soup when you can drink without vomiting.  Drink enough fluid to keep your urine clear or pale yellow.  Make sure you have little or no nausea before eating solid foods.  Follow the diet recommended by your health care provider. General instructions  Have a responsible adult stay with you until you are awake and alert.  Return to your normal activities as told by your health care provider. Ask your health care provider what activities are safe for you.  Take over-the-counter and prescription medicines only as told by your health care provider.  If you smoke, do not smoke without supervision.  Keep all follow-up visits as told by your health care provider. This is important. Contact a health care provider if:  You continue to have nausea or vomiting at home, and medicines are not helpful.  You  cannot drink fluids or start eating again.  You cannot urinate after 8-12 hours.  You develop a skin rash.  You have fever.  You have increasing redness at the site of your procedure. Get help right away if:  You have difficulty breathing.  You have chest pain.  You have unexpected bleeding.  You feel that you are having a life-threatening or urgent problem. This information is not intended to replace advice given to you by your health care provider. Make sure you discuss any questions you have with your health care provider. Document Released: 06/17/2000 Document Revised: 08/14/2015 Document Reviewed: 02/23/2015 [employer] Interactive Patient Education  2018 [employer]   Orthopedic discharge instructions: Keep dressing dry and intact. Keep hand elevated above heart level. May shower after dressing removed on postop day 4 (Sunday). Cover sutures with Band-Aids after drying off, then reapply Velcro splint. Apply ice to affected area frequently. Resume Eliquis tomorrow morning. Take ES Tylenol or pain medication as prescribed when needed.  Return for follow-up in 10-14 days or as scheduled.

## 2017-10-31 DIAGNOSIS — M48062 Spinal stenosis, lumbar region with neurogenic claudication: Secondary | ICD-10-CM | POA: Diagnosis not present

## 2017-10-31 DIAGNOSIS — M5416 Radiculopathy, lumbar region: Secondary | ICD-10-CM | POA: Diagnosis not present

## 2017-10-31 DIAGNOSIS — M5136 Other intervertebral disc degeneration, lumbar region: Secondary | ICD-10-CM | POA: Diagnosis not present

## 2017-11-10 DIAGNOSIS — M545 Low back pain: Secondary | ICD-10-CM | POA: Diagnosis not present

## 2017-11-10 DIAGNOSIS — M47816 Spondylosis without myelopathy or radiculopathy, lumbar region: Secondary | ICD-10-CM | POA: Diagnosis not present

## 2017-11-10 DIAGNOSIS — M4310 Spondylolisthesis, site unspecified: Secondary | ICD-10-CM | POA: Diagnosis not present

## 2017-11-10 DIAGNOSIS — G8929 Other chronic pain: Secondary | ICD-10-CM | POA: Diagnosis not present

## 2017-11-14 DIAGNOSIS — M47816 Spondylosis without myelopathy or radiculopathy, lumbar region: Secondary | ICD-10-CM | POA: Diagnosis not present

## 2017-11-14 DIAGNOSIS — M5416 Radiculopathy, lumbar region: Secondary | ICD-10-CM | POA: Diagnosis not present

## 2017-11-14 DIAGNOSIS — M5136 Other intervertebral disc degeneration, lumbar region: Secondary | ICD-10-CM | POA: Diagnosis not present

## 2017-12-03 DIAGNOSIS — M47816 Spondylosis without myelopathy or radiculopathy, lumbar region: Secondary | ICD-10-CM | POA: Diagnosis not present

## 2017-12-04 DIAGNOSIS — H2513 Age-related nuclear cataract, bilateral: Secondary | ICD-10-CM | POA: Diagnosis not present

## 2017-12-04 DIAGNOSIS — H40033 Anatomical narrow angle, bilateral: Secondary | ICD-10-CM | POA: Diagnosis not present

## 2017-12-22 DIAGNOSIS — M9901 Segmental and somatic dysfunction of cervical region: Secondary | ICD-10-CM | POA: Diagnosis not present

## 2017-12-22 DIAGNOSIS — M9903 Segmental and somatic dysfunction of lumbar region: Secondary | ICD-10-CM | POA: Diagnosis not present

## 2017-12-30 DIAGNOSIS — M47816 Spondylosis without myelopathy or radiculopathy, lumbar region: Secondary | ICD-10-CM | POA: Diagnosis not present

## 2018-01-07 DIAGNOSIS — I480 Paroxysmal atrial fibrillation: Secondary | ICD-10-CM | POA: Diagnosis not present

## 2018-01-07 DIAGNOSIS — E785 Hyperlipidemia, unspecified: Secondary | ICD-10-CM | POA: Diagnosis not present

## 2018-01-07 DIAGNOSIS — Z9889 Other specified postprocedural states: Secondary | ICD-10-CM | POA: Diagnosis not present

## 2018-01-07 DIAGNOSIS — G4733 Obstructive sleep apnea (adult) (pediatric): Secondary | ICD-10-CM | POA: Diagnosis not present

## 2018-01-21 DIAGNOSIS — M545 Low back pain: Secondary | ICD-10-CM | POA: Diagnosis not present

## 2018-01-21 DIAGNOSIS — M9905 Segmental and somatic dysfunction of pelvic region: Secondary | ICD-10-CM | POA: Diagnosis not present

## 2018-01-21 DIAGNOSIS — M4316 Spondylolisthesis, lumbar region: Secondary | ICD-10-CM | POA: Diagnosis not present

## 2018-01-27 DIAGNOSIS — G2581 Restless legs syndrome: Secondary | ICD-10-CM | POA: Diagnosis not present

## 2018-01-27 DIAGNOSIS — E538 Deficiency of other specified B group vitamins: Secondary | ICD-10-CM | POA: Insufficient documentation

## 2018-01-27 DIAGNOSIS — R29898 Other symptoms and signs involving the musculoskeletal system: Secondary | ICD-10-CM | POA: Diagnosis not present

## 2018-01-27 DIAGNOSIS — M4727 Other spondylosis with radiculopathy, lumbosacral region: Secondary | ICD-10-CM | POA: Diagnosis not present

## 2018-01-27 DIAGNOSIS — M5416 Radiculopathy, lumbar region: Secondary | ICD-10-CM | POA: Diagnosis not present

## 2018-01-27 DIAGNOSIS — M47816 Spondylosis without myelopathy or radiculopathy, lumbar region: Secondary | ICD-10-CM | POA: Diagnosis not present

## 2018-01-27 DIAGNOSIS — M5136 Other intervertebral disc degeneration, lumbar region: Secondary | ICD-10-CM | POA: Diagnosis not present

## 2018-01-27 DIAGNOSIS — E559 Vitamin D deficiency, unspecified: Secondary | ICD-10-CM | POA: Insufficient documentation

## 2018-01-27 DIAGNOSIS — G5602 Carpal tunnel syndrome, left upper limb: Secondary | ICD-10-CM | POA: Diagnosis not present

## 2018-01-27 DIAGNOSIS — M48062 Spinal stenosis, lumbar region with neurogenic claudication: Secondary | ICD-10-CM | POA: Diagnosis not present

## 2018-01-28 DIAGNOSIS — R600 Localized edema: Secondary | ICD-10-CM | POA: Diagnosis not present

## 2018-01-28 DIAGNOSIS — I480 Paroxysmal atrial fibrillation: Secondary | ICD-10-CM | POA: Diagnosis not present

## 2018-02-04 DIAGNOSIS — M545 Low back pain: Secondary | ICD-10-CM | POA: Diagnosis not present

## 2018-02-04 DIAGNOSIS — M5137 Other intervertebral disc degeneration, lumbosacral region: Secondary | ICD-10-CM | POA: Diagnosis not present

## 2018-02-04 DIAGNOSIS — M9905 Segmental and somatic dysfunction of pelvic region: Secondary | ICD-10-CM | POA: Diagnosis not present

## 2018-02-04 DIAGNOSIS — M4316 Spondylolisthesis, lumbar region: Secondary | ICD-10-CM | POA: Diagnosis not present

## 2018-02-10 DIAGNOSIS — M9905 Segmental and somatic dysfunction of pelvic region: Secondary | ICD-10-CM | POA: Diagnosis not present

## 2018-02-10 DIAGNOSIS — M5137 Other intervertebral disc degeneration, lumbosacral region: Secondary | ICD-10-CM | POA: Diagnosis not present

## 2018-02-10 DIAGNOSIS — M545 Low back pain: Secondary | ICD-10-CM | POA: Diagnosis not present

## 2018-02-10 DIAGNOSIS — M4316 Spondylolisthesis, lumbar region: Secondary | ICD-10-CM | POA: Diagnosis not present

## 2018-02-11 DIAGNOSIS — I480 Paroxysmal atrial fibrillation: Secondary | ICD-10-CM | POA: Diagnosis not present

## 2018-02-11 DIAGNOSIS — Z9889 Other specified postprocedural states: Secondary | ICD-10-CM | POA: Diagnosis not present

## 2018-02-11 DIAGNOSIS — M9905 Segmental and somatic dysfunction of pelvic region: Secondary | ICD-10-CM | POA: Diagnosis not present

## 2018-02-11 DIAGNOSIS — M4316 Spondylolisthesis, lumbar region: Secondary | ICD-10-CM | POA: Diagnosis not present

## 2018-02-11 DIAGNOSIS — E785 Hyperlipidemia, unspecified: Secondary | ICD-10-CM | POA: Diagnosis not present

## 2018-02-11 DIAGNOSIS — G4733 Obstructive sleep apnea (adult) (pediatric): Secondary | ICD-10-CM | POA: Diagnosis not present

## 2018-02-11 DIAGNOSIS — M545 Low back pain: Secondary | ICD-10-CM | POA: Diagnosis not present

## 2018-02-11 DIAGNOSIS — M5137 Other intervertebral disc degeneration, lumbosacral region: Secondary | ICD-10-CM | POA: Diagnosis not present

## 2018-02-11 DIAGNOSIS — R600 Localized edema: Secondary | ICD-10-CM | POA: Diagnosis not present

## 2018-02-17 DIAGNOSIS — M545 Low back pain: Secondary | ICD-10-CM | POA: Diagnosis not present

## 2018-02-17 DIAGNOSIS — M4316 Spondylolisthesis, lumbar region: Secondary | ICD-10-CM | POA: Diagnosis not present

## 2018-02-17 DIAGNOSIS — M9905 Segmental and somatic dysfunction of pelvic region: Secondary | ICD-10-CM | POA: Diagnosis not present

## 2018-02-17 DIAGNOSIS — M5137 Other intervertebral disc degeneration, lumbosacral region: Secondary | ICD-10-CM | POA: Diagnosis not present

## 2018-03-02 DIAGNOSIS — Z9889 Other specified postprocedural states: Secondary | ICD-10-CM | POA: Diagnosis not present

## 2018-03-02 DIAGNOSIS — G4733 Obstructive sleep apnea (adult) (pediatric): Secondary | ICD-10-CM | POA: Diagnosis not present

## 2018-03-02 DIAGNOSIS — I480 Paroxysmal atrial fibrillation: Secondary | ICD-10-CM | POA: Diagnosis not present

## 2018-03-02 DIAGNOSIS — R12 Heartburn: Secondary | ICD-10-CM | POA: Diagnosis not present

## 2018-03-05 DIAGNOSIS — M5136 Other intervertebral disc degeneration, lumbar region: Secondary | ICD-10-CM | POA: Diagnosis not present

## 2018-03-05 DIAGNOSIS — M5416 Radiculopathy, lumbar region: Secondary | ICD-10-CM | POA: Diagnosis not present

## 2018-04-22 DIAGNOSIS — Z8679 Personal history of other diseases of the circulatory system: Secondary | ICD-10-CM | POA: Diagnosis not present

## 2018-04-22 DIAGNOSIS — G4733 Obstructive sleep apnea (adult) (pediatric): Secondary | ICD-10-CM | POA: Diagnosis not present

## 2018-04-22 DIAGNOSIS — R600 Localized edema: Secondary | ICD-10-CM | POA: Diagnosis not present

## 2018-04-22 DIAGNOSIS — R12 Heartburn: Secondary | ICD-10-CM | POA: Diagnosis not present

## 2018-04-22 DIAGNOSIS — G4732 High altitude periodic breathing: Secondary | ICD-10-CM | POA: Diagnosis not present

## 2018-04-23 DIAGNOSIS — M1612 Unilateral primary osteoarthritis, left hip: Secondary | ICD-10-CM | POA: Diagnosis not present

## 2018-04-23 DIAGNOSIS — M4727 Other spondylosis with radiculopathy, lumbosacral region: Secondary | ICD-10-CM | POA: Diagnosis not present

## 2018-04-23 DIAGNOSIS — G8929 Other chronic pain: Secondary | ICD-10-CM | POA: Diagnosis not present

## 2018-04-23 DIAGNOSIS — M25552 Pain in left hip: Secondary | ICD-10-CM | POA: Diagnosis not present

## 2018-04-24 DIAGNOSIS — M5416 Radiculopathy, lumbar region: Secondary | ICD-10-CM | POA: Diagnosis not present

## 2018-04-24 DIAGNOSIS — M5136 Other intervertebral disc degeneration, lumbar region: Secondary | ICD-10-CM | POA: Diagnosis not present

## 2018-04-24 DIAGNOSIS — M47816 Spondylosis without myelopathy or radiculopathy, lumbar region: Secondary | ICD-10-CM | POA: Diagnosis not present

## 2018-04-24 DIAGNOSIS — M48062 Spinal stenosis, lumbar region with neurogenic claudication: Secondary | ICD-10-CM | POA: Diagnosis not present

## 2018-04-28 ENCOUNTER — Other Ambulatory Visit: Payer: Self-pay | Admitting: Cardiology

## 2018-04-28 DIAGNOSIS — R600 Localized edema: Secondary | ICD-10-CM

## 2018-04-29 ENCOUNTER — Telehealth: Payer: Self-pay | Admitting: Cardiology

## 2018-04-29 NOTE — Telephone Encounter (Signed)
Needs venous insufficiency now please

## 2018-05-06 DIAGNOSIS — M9901 Segmental and somatic dysfunction of cervical region: Secondary | ICD-10-CM | POA: Diagnosis not present

## 2018-05-06 DIAGNOSIS — M9903 Segmental and somatic dysfunction of lumbar region: Secondary | ICD-10-CM | POA: Diagnosis not present

## 2018-05-10 ENCOUNTER — Other Ambulatory Visit: Payer: Self-pay | Admitting: Cardiology

## 2018-05-15 ENCOUNTER — Other Ambulatory Visit: Payer: Self-pay | Admitting: Cardiology

## 2018-05-15 DIAGNOSIS — I878 Other specified disorders of veins: Secondary | ICD-10-CM

## 2018-05-15 DIAGNOSIS — I83893 Varicose veins of bilateral lower extremities with other complications: Secondary | ICD-10-CM

## 2018-05-15 NOTE — Telephone Encounter (Signed)
Dr. Jacinto Halim aware

## 2018-05-22 ENCOUNTER — Ambulatory Visit (HOSPITAL_COMMUNITY)
Admission: RE | Admit: 2018-05-22 | Discharge: 2018-05-22 | Disposition: A | Discharge status: Home / Self Care | Payer: BLUE CROSS/BLUE SHIELD | Source: Ambulatory Visit | Attending: Vascular Surgery | Admitting: Vascular Surgery

## 2018-05-22 DIAGNOSIS — I83893 Varicose veins of bilateral lower extremities with other complications: Secondary | ICD-10-CM | POA: Insufficient documentation

## 2018-05-22 DIAGNOSIS — I878 Other specified disorders of veins: Secondary | ICD-10-CM | POA: Diagnosis not present

## 2018-05-23 NOTE — Telephone Encounter (Signed)
You have leg swelling.  Most common reasons excessive salt intake, sedentary lifestyle, sitting on chair for prolonged periods, pains in the legs not working properly.  He would find improvement in symptoms of leg discomfort by wearing support stockings, stockings should be worn within the 1st hour of waking up and has to be taken off during the night.  Other options including procedure on the legs will be discussed if necessary during her office visit.  He can come in to be fitted.

## 2018-05-26 NOTE — Telephone Encounter (Signed)
Awaiting call back.

## 2018-05-27 NOTE — Telephone Encounter (Signed)
Pt is already wearing compression socks

## 2018-05-29 ENCOUNTER — Other Ambulatory Visit: Payer: Self-pay

## 2018-05-29 DIAGNOSIS — I480 Paroxysmal atrial fibrillation: Secondary | ICD-10-CM

## 2018-05-29 MED ORDER — FUROSEMIDE 40 MG PO TABS: 40.0000 mg | ORAL_TABLET | Freq: Every day | ORAL | 3 refills | Status: DC

## 2018-06-01 ENCOUNTER — Other Ambulatory Visit: Payer: Self-pay

## 2018-06-01 DIAGNOSIS — I480 Paroxysmal atrial fibrillation: Secondary | ICD-10-CM

## 2018-06-01 MED ORDER — FUROSEMIDE 40 MG PO TABS: 40.0000 mg | ORAL_TABLET | Freq: Every day | ORAL | 3 refills | Status: DC

## 2018-06-08 ENCOUNTER — Telehealth: Payer: Self-pay

## 2018-06-08 NOTE — Telephone Encounter (Signed)
Spoke with Craig Morgan and he is just getting over a cold,no fever, coughing up yellow mucus.pt states he's feeling better and want's appt scheduled for tomorrow. I explained protocol to pt and he understand's so we rescheduled appt for 06/15/18, Pt instructed to call us if symptoms continue for 03/23 appt.

## 2018-06-09 ENCOUNTER — Ambulatory Visit: Payer: BLUE CROSS/BLUE SHIELD | Admitting: Student in an Organized Health Care Education/Training Program

## 2018-06-09 NOTE — Telephone Encounter (Signed)
Thanks for letting us know. 

## 2018-06-15 ENCOUNTER — Encounter: Payer: Self-pay | Admitting: Student in an Organized Health Care Education/Training Program

## 2018-06-15 ENCOUNTER — Other Ambulatory Visit: Payer: Self-pay

## 2018-06-15 ENCOUNTER — Ambulatory Visit
Payer: BLUE CROSS/BLUE SHIELD | Attending: Student in an Organized Health Care Education/Training Program | Admitting: Student in an Organized Health Care Education/Training Program

## 2018-06-15 VITALS — BP 125/78 | HR 85 | Temp 98.6°F | Resp 18 | Ht 74.0 in | Wt 278.0 lb

## 2018-06-15 DIAGNOSIS — M5416 Radiculopathy, lumbar region: Secondary | ICD-10-CM

## 2018-06-15 DIAGNOSIS — M47816 Spondylosis without myelopathy or radiculopathy, lumbar region: Secondary | ICD-10-CM | POA: Diagnosis not present

## 2018-06-15 DIAGNOSIS — M51369 Other intervertebral disc degeneration, lumbar region without mention of lumbar back pain or lower extremity pain: Secondary | ICD-10-CM

## 2018-06-15 DIAGNOSIS — M5136 Other intervertebral disc degeneration, lumbar region: Secondary | ICD-10-CM | POA: Insufficient documentation

## 2018-06-15 DIAGNOSIS — G629 Polyneuropathy, unspecified: Secondary | ICD-10-CM | POA: Diagnosis not present

## 2018-06-15 NOTE — Progress Notes (Signed)
atient's Name: Craig Morgan  MRN: [id number]  Referring Provider: Katheren Shams  DOB: 1955-10-05  PCP: Katheren Shams  DOS: 06/15/2018  Note by: Gillis Santa, MD  Service setting: Ambulatory outpatient  Specialty: Interventional Pain Management  Location: [facility] (AMB) Pain Management Facility  Visit type: Initial Patient Evaluation  Patient type: New Patient   Primary Reason(s) for Visit: Encounter for initial evaluation of one or more chronic problems (new to examiner) potentially causing chronic pain, and posing a threat to normal musculoskeletal function. (Level of risk: High) CC: Back Pain (lower)  HPI  Craig Morgan is a 63 y.o. year old, male patient, who comes today to see [place] for the first time for an initial evaluation of his chronic pain. 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 Today he comes in for evaluation of his Back Pain (lower)  Pain Assessment: Location: Lower Back(Also neuropathic pain from calves to toes; numbness) Radiating: into buttocks, to knees, bilat Onset: More than a month ago Duration: Chronic pain, Neuropathic pain Quality: Constant, Sharp Severity: 8 /10 (subjective, self-reported pain score)  Note: Reported level is inconsistent with clinical observations.                         When using our objective Pain Scale, levels between 6 and 10/10 are said to belong in an emergency room, as it progressively worsens from a 6/10, described as severely limiting, requiring emergency care not usually available at an outpatient pain management  facility. At a 6/10 level, communication becomes difficult and requires great effort. Assistance to reach the emergency department may be required. Facial flushing and profuse sweating along with potentially dangerous increases in heart rate and blood pressure will be evident. Effect on ADL: limits daily activities; unsteady gait Timing: Constant Modifying factors: sitting BP: 125/78  HR: 85  Onset and Duration: Sudden Cause of pain: Unknown Severity: No change since onset, NAS-11 at its worse: 10/10, NAS-11 at its best: 6/10, NAS-11 now: 8/10 and NAS-11 on the average: 8/10 Timing: Morning and During activity or exercise Aggravating Factors: Bending, Prolonged standing, Walking, Walking uphill and Walking downhill Alleviating Factors: Lying down and Sitting Associated Problems: Day-time cramps, Night-time cramps, Numbness and Weakness Quality of Pain: Sharp, Shooting and Stabbing Previous Examinations or Tests: MRI scan, X-rays, Nerve conduction test and Chiropractic evaluation Previous Treatments: Traction  The patient comes into the clinics today for the first time for a chronic pain management evaluation.   63 year old male with a chief complaint of axial low back and buttock pain with intermittent left leg pain.  He states that this pain is been present for approximately 10 years.  It radiates into his bilateral buttocks and the superior portion of his left thigh.  Patient has received multiple epidural steroid injections with physical medicine and rehab, bilateral L4-L5 transforaminal ESI of which only the first 1 was effective for 3 to 4 days.  Patient also had bilateral L3 and L4 medial branch nerve blocks performed without any significant pain relief.  Patient also had a left hip injection without any significant improvement.  Patient states that he is off of Eliquis and  has not been on this medication for over 8 months.  Patient has tried physical therapy in the past and did it for 1  month.  Patient used to be a Administrator and does have a history of alcohol abuse in the remote past.  He states that he is not abusing alcohol any longer.  Patient was on gabapentin 300 mg 3 times a day with mild to moderate benefit.  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  He is being referred here for consideration of spinal cord stimulator trial.   Meds   Current Outpatient Medications:  .  Cholecalciferol (VITAMIN D PO), Take by mouth daily., Disp: , Rfl:  .  Cyanocobalamin (VITAMIN B-12 PO), Take by mouth daily., Disp: , Rfl:  .  dofetilide (TIKOSYN) 500 MCG capsule, Take 1 capsule (500 mcg total) by mouth 2 (two) times daily., Disp: 60 capsule, Rfl: 3 .  furosemide (LASIX) 40 MG tablet, Take 1 tablet (40 mg total) by mouth daily., Disp: 90 tablet, Rfl: 3 .  magnesium oxide (MAG-OX) 400 MG tablet, Take 400 mg by mouth daily., Disp: , Rfl: 2 .  Magnesium Oxide -Mg Supplement 400 MG CAPS, TAKE 1 CAPSULE BY MOUTH ONCE DAILY, Disp: 90 capsule, Rfl: 0 .  omeprazole (PRILOSEC) 20 MG capsule, Take 20 mg by mouth daily., Disp: , Rfl:  .  rOPINIRole (REQUIP) 4 MG tablet, Take 4 mg by mouth 3 (three) times daily as needed (RLS). , Disp: , Rfl:  .  ELIQUIS 5 MG TABS tablet, Take 1 tablet by mouth 2 (two) times daily., Disp: , Rfl: 0 .  gabapentin (NEURONTIN) 300 MG capsule, Take 300 mg by mouth 3 (three) times daily., Disp: , Rfl:  .  HYDROcodone-acetaminophen (NORCO/VICODIN) 5-325 MG tablet, Take 1-2 tablets by mouth every 6 (six) hours as needed for moderate pain. (Patient not taking: Reported on 06/15/2018), Disp: 20 tablet, Rfl: 0 .  hydrocortisone cream 0.5 %, Apply 1 application topically every 8 (eight) hours as needed for itching. (Patient not taking: Reported on 06/15/2018), Disp: 15 g, Rfl: 0  Imaging Review   Shoulder-R DG:  Results for orders placed during the hospital  encounter of 12/21/09  DG Shoulder Right   Narrative Clinical Data: Right shoulder pain.  Limited range of motion.  No known injury.   RIGHT SHOULDER - 2+ VIEW   Comparison: None   Findings: Early degenerative changes in the right [facility] joint with inferior spurring. No acute bony abnormality.  Specifically, no fracture, subluxation, or dislocation.  Soft tissues are intact.   IMPRESSION: No acute bony abnormality. Early degenerative changes in the right [facility] joint.  Provider: Kinnie Scales    Results for orders placed in visit on 06/17/11  DG Thoracic Spine 2 View   Narrative *RADIOLOGY REPORT*  Clinical Data: 63 year old male with cough, shortness of breath, back pain.  THORACIC SPINE - 2 VIEW  Comparison: Chest radiograph from the same day.  Findings: Normal thoracic segmentation. Bone mineralization is within normal limits.  Vertebral height and alignment within normal limits; minimal upper thoracic dextroconvex scoliosis.  Grossly intact posterior ribs.  Disc spaces are relatively preserved.  IMPRESSION: No acute osseous abnormality in the thoracic spine.  Original Report Authenticated By: Randall An, M.D.    Lumbosacral Imaging: Lumbar MR wo contrast:  Results for orders placed during the hospital encounter of 06/27/17  MR LUMBAR SPINE  WO CONTRAST   Narrative CLINICAL DATA:  Low back and bilateral buttock pain. Pain occasionally radiates down the legs, greater on the left. Right foot numbness for the past year.  EXAM: MRI LUMBAR SPINE WITHOUT CONTRAST  TECHNIQUE: Multiplanar, multisequence MR imaging of the lumbar spine was performed. No intravenous contrast was administered.  COMPARISON:  10/09/2013  FINDINGS: Segmentation:  Standard.  Alignment: 5 mm anterolisthesis of L4 on L5, slightly increased. Trace retrolisthesis of L2 on L3. Trace anterolisthesis of L5 on S1.  Vertebrae: No fracture or suspicious osseous lesion. New mild right-sided  degenerative endplate edema at W0-J8. New edema in the facets and pedicles bilaterally at L4-5 and on the right at L5-S1.  Conus medullaris and cauda equina: Conus extends to the T12-L1 level. Conus and cauda equina appear normal.  Paraspinal and other soft tissues: Subcentimeter T2 hyperintense lesion in the right kidney, likely a cyst.  Disc levels:  Disc desiccation throughout the lumbar spine with relative sparing of L1-2.  T12-L1 and L1-2: At most minimal disc bulging without stenosis.  L2-3: Mild disc space narrowing, progressed from prior. Progressive circumferential disc bulging minimally narrows the lateral recesses without significant spinal or neural foraminal stenosis or evidence of L3 nerve root impingement.  L3-4: Circumferential disc bulging, right foraminal to extraforaminal disc protrusion, and mild facet and ligamentum flavum hypertrophy result in moderate bilateral lateral recess stenosis, mild-to-moderate generalized spinal stenosis, and moderate bilateral neural foraminal stenosis. Findings have overall slightly progressed.  L4-5: Mild disc space narrowing, slightly progressed. Anterolisthesis with disc uncovering, ligamentum flavum thickening, and severe facet arthrosis result in mild bilateral neural foraminal stenosis. The left foraminal disc protrusion on the prior study has regressed, and left neural foraminal patency has mildly improved. No significant spinal stenosis.  L5-S1: Chronically asymmetric appearance of the lamina which may be developmental. Disc bulging asymmetric to the right and severe facet arthrosis result in mild-to-moderate right neural foraminal stenosis, progressed from prior. No spinal stenosis.  IMPRESSION: 1. Mildly progressive lumbar disc degeneration, most notable at L3-4 where there is moderate bilateral lateral recess and neural foraminal stenosis. 2. Slightly increased anterolisthesis at L4-5. Improved left foraminal  patency due to regression of a disc protrusion. 3. Increased mild-to-moderate right neural foraminal stenosis at L5-S1.   Electronically Signed   By: Logan Bores M.D.   On: 06/27/2017 15:56     Lumbar DG 2-3 views:  Results for orders placed in visit on 06/17/11  DG Lumbar Spine 2-3 Views   Narrative *RADIOLOGY REPORT*  Clinical Data: 63 year old male with pain.  LUMBAR SPINE - 2-3 VIEW  Comparison: Thoracic series from the same day.  Findings: Normal lumbar segmentation.  Trace anterolisthesis of L4 on L5.  Associated L4-L5 and L5-L1 facet hypertrophy.  No definite pars fracture.  Disc space narrowing L4-L5.  Lumbar vertebral height preserved.  SI joints within normal limits.  IMPRESSION: Grade 1 spondylolisthesis at L4-L5 with associated disc and facet degeneration.  Original Report Authenticated By: Randall An, M.D.   Lumbar DG (Complete) 4+V:  Results for orders placed in visit on 05/20/13  DG Lumbar Spine Complete   Narrative CLINICAL DATA:  Left-sided low back pain radiating to the left leg.  EXAM: LUMBAR SPINE - COMPLETE 4+ VIEW  COMPARISON:  06/17/2011  FINDINGS: No fracture. There is a grade 1 anterolisthesis of L4 on L5. There is mild to moderate loss of disc height at L4-L5.  No other spondylolisthesis. There is minor loss of disc height at L2-L3. The  remaining lumbar disc spaces are well preserved.  There are facet degenerative changes at L4-L5 and L5-S1, greater on the left.  Generalized increased bowel gas is noted. The soft tissues are unremarkable.  IMPRESSION: 1. No fracture or acute finding. 2. Degenerative changes as described including a grade 1 anterolisthesis of L4 on L5. These findings are stable from the prior study.   Electronically Signed   By: Lajean Manes M.D.   On: 05/20/2013 14:27           Results for orders placed during the hospital encounter of 03/15/09  DG Hand Complete Right   Narrative Clinical Data: 2  months slightly painful palpable lesions at the palmar surface right hand.   RIGHT HAND - COMPLETE 3+ VIEW   Comparison: None   Findings: Region of palpable concern marked at palmar surface with surface BB ease at the level of the right third metacarpal neck. No radiopaque abnormality associated with these findings.  No significant osseous, articular, or soft tissue abnormality noted.   IMPRESSION: No significant radiographic abnormality.  Provider: Dawayne Cirri   Hand-L DG Complete: No results found for this or any previous visit.  Complexity Note: Imaging results reviewed. Results shared with Mr. Gaspard, using Layman's terms.                         ROS  Cardiovascular: Abnormal heart rhythm Pulmonary or Respiratory: Temporary stoppage of breathing during sleep Neurological: No reported neurological signs or symptoms such as seizures, abnormal skin sensations, urinary and/or fecal incontinence, being born with an abnormal open spine and/or a tethered spinal cord Review of Past Neurological Studies:  Results for orders placed or performed during the hospital encounter of 06/27/17  MR BRAIN W WO CONTRAST   Narrative   CLINICAL DATA:  Acromegaly.  EXAM: MRI HEAD WITHOUT AND WITH CONTRAST  TECHNIQUE: Multiplanar, multiecho pulse sequences of the brain and surrounding structures were obtained without and with intravenous contrast.  CONTRAST:  77m MULTIHANCE GADOBENATE DIMEGLUMINE 529 MG/ML IV SOLN  COMPARISON:  None.  FINDINGS: There is motion artifact throughout the study, with multiple sequences (including dedicated pituitary sequences) being moderately degraded despite repeated imaging attempts.  Brain: There is no evidence of acute infarct, intracranial hemorrhage, mass, midline shift, or extra-axial fluid collection. The ventricles and sulci are normal. No significant cerebral white matter disease is seen. No abnormal enhancement is identified.  Dedicated  pituitary imaging was performed. The pituitary gland is small and located along the floor of the sella without significant sellar expansion. Within limitations of motion artifact, no focal pituitary lesion is identified. The infundibulum is midline. The optic chiasm and cavernous sinuses are unremarkable.  Vascular: Major intracranial vascular flow voids are preserved.  Skull and upper cervical spine: Unremarkable bone marrow signal.  Sinuses/Orbits: Unremarkable orbits. Minimal scattered paranasal sinus mucosal thickening. Asymmetric left-sided nasal turbinate hypertrophy. Clear mastoid air cells.  Other: None.  IMPRESSION: 1. Mildly partially empty sella configuration without focal pituitary lesion identified within limitations of motion artifact. 2. Unremarkable appearance of the brain for age.   Electronically Signed   By: ALogan BoresM.D.   On: 06/27/2017 16:06    Psychological-Psychiatric: No reported psychological or psychiatric signs or symptoms such as difficulty sleeping, anxiety, depression, delusions or hallucinations (schizophrenial), mood swings (bipolar disorders) or suicidal ideations or attempts Gastrointestinal: Reflux or heatburn Genitourinary: No reported renal or genitourinary signs or symptoms such as difficulty voiding or producing urine, peeing blood,  non-functioning kidney, kidney stones, difficulty emptying the bladder, difficulty controlling the flow of urine, or chronic kidney disease Hematological: No reported hematological signs or symptoms such as prolonged bleeding, low or poor functioning platelets, bruising or bleeding easily, hereditary bleeding problems, low energy levels due to low hemoglobin or being anemic Endocrine: No reported endocrine signs or symptoms such as high or low blood sugar, rapid heart rate due to high thyroid levels, obesity or weight gain due to slow thyroid or thyroid disease Rheumatologic: No reported rheumatological signs and  symptoms such as fatigue, joint pain, tenderness, swelling, redness, heat, stiffness, decreased range of motion, with or without associated rash Musculoskeletal: Negative for myasthenia gravis, muscular dystrophy, multiple sclerosis or malignant hyperthermia Work History: Working full time  Allergies  Mr. Eyer is allergic to penicillins.  Laboratory Chemistry  Inflammation Markers (CRP: Acute Phase) (ESR: Chronic Phase) No results found for: CRP, ESRSEDRATE, LATICACIDVEN                       Rheumatology Markers No results found for: RF, ANA, LABURIC, URICUR, LYMEIGGIGMAB, LYMEABIGMQN, HLAB27                      Renal Function Markers Lab Results  Component Value Date   BUN 13 06/05/2017   CREATININE 0.94 06/05/2017   GFRAA >60 06/05/2017   GFRNONAA >60 06/05/2017                             Hepatic Function Markers Lab Results  Component Value Date   AST 22 09/30/2011   ALT 25 09/30/2011   ALBUMIN 5.0 09/30/2011   ALKPHOS 59 09/30/2011                        Electrolytes Lab Results  Component Value Date   NA 141 06/05/2017   K 4.1 06/05/2017   CL 105 06/05/2017   CALCIUM 9.5 06/05/2017   MG 1.8 06/05/2017                        Neuropathy Markers Lab Results  Component Value Date   [id number] 734 09/30/2011   HIV Non Reactive 06/02/2017                        CNS Tests No results found for: COLORCSF, APPEARCSF, RBCCOUNTCSF, WBCCSF, POLYSCSF, LYMPHSCSF, EOSCSF, PROTEINCSF, GLUCCSF, JCVIRUS, CSFOLI, IGGCSF                      Bone Pathology Markers No results found for: VD25OH, VD125OH2TOT, [id number], [id number], 25OHVITD1, 25OHVITD2, 25OHVITD3, TESTOFREE, TESTOSTERONE                       Coagulation Parameters Lab Results  Component Value Date   INR 1.11 06/02/2017   LABPROT 14.2 06/02/2017   PLT 209 06/04/2017                        Cardiovascular Markers Lab Results  Component Value Date   CKTOTAL 296 (H) 09/30/2011   HGB 14.1 06/04/2017    HCT 42.3 06/04/2017                         CA Markers No results found for: CEA, CA125, LABCA2  Endocrine Markers Lab Results  Component Value Date   TSH 1.977 09/30/2011                        Note: Lab results reviewed.  PFSH  Drug: Mr. Belter  reports no history of drug use. Alcohol:  reports current alcohol use. Tobacco:  reports that he quit smoking about 22 years ago. His smokeless tobacco use includes snuff. Medical:  has a past medical history of Atrial flutter ([place]) (2012), Back pain, DDD (degenerative disc disease), lumbosacral, Family history of adverse reaction to anesthesia, GERD (gastroesophageal reflux disease), Numbness, Persistent atrial fibrillation, Sciatica, and Sleep apnea. Family: family history includes COPD in his mother; Cancer in his maternal grandfather; Heart attack in his father and mother; Heart disease in his father.  Past Surgical History:  Procedure Laterality Date  . ADENOIDECTOMY    . APPENDECTOMY    . CARDIOVERSION  02/04/2012   Procedure: CARDIOVERSION;  Surgeon: Laverda Page, MD;  Location: [place];  Service: Cardiovascular;  Laterality: N/A;  . CARDIOVERSION N/A 05/16/2015   Procedure: CARDIOVERSION;  Surgeon: Adrian Prows, MD;  Location: [place];  Service: Cardiovascular;  Laterality: N/A;  . CARDIOVERSION N/A 06/05/2017   Procedure: CARDIOVERSION;  Surgeon: Nigel Mormon, MD;  Location: [place] [facility];  Service: Cardiovascular;  Laterality: N/A;  . CARPAL TUNNEL RELEASE Left 09/24/2017   Procedure: CARPAL TUNNEL RELEASE ENDOSCOPIC;  Surgeon: Corky Mull, MD;  Location: [place];  Service: Orthopedics;  Laterality: Left;  sleep apnea  . CARPAL TUNNEL RELEASE Right 10/29/2017   Procedure: ENDOSCOPIC CARPAL TUNNEL RELEASE WRIST;  Surgeon: Corky Mull, MD;  Location: [place];  Service: Orthopedics;  Laterality: Right;  sleep apnea  . EYE SURGERY     LASIK EYE SURG X 2 LEFT EYE AND ONCE  ON RT EYE  . HEMORROIDECTOMY    . HERNIA REPAIR     LEFT INGUINAL HERNIA;  2ND SURGERY TO DO REVISION LEFT INGUINAL HERNIA AND REPAIR RT INGUINAL HERNIA  . INGUINAL HERNIA REPAIR Left 12/08/2012   Procedure: open left inguinal  EXPLORATION;  Surgeon: Pedro Earls, MD;  Location: [facility];  Service: General;  Laterality: Left;  . TONSILLECTOMY     Active Ambulatory Problems    Diagnosis Date Noted  . OSA on CPAP 11/18/2012  . Open [facility] July 2009 [place] 11/18/2012  . Status post laparoscopic bilateral hernia repair 2010 in [facility] 11/18/2012  . Neuralgia of left inguinal region 11/18/2012  . 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 Persistent atrial fibrillation ([place]) 11/18/2012  . Left atrial enlargement 06/12/2015  . Paroxysmal atrial fibrillation ([place]) 06/02/2017  . 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 Lumbosacral radiculopathy due to degenerative joint disease of spine 01/27/2018   Resolved Ambulatory Problems    Diagnosis Date Noted  . No Resolved Ambulatory Problems   Past Medical History:  Diagnosis Date  . Atrial flutter ([place]) 2012  . Back pain   . DDD (degenerative disc disease), lumbosacral   . Family history of adverse reaction to anesthesia   . GERD (gastroesophageal reflux disease)   . Numbness   . Sciatica   . Sleep apnea    Constitutional Exam  General appearance: Well nourished, well developed, and well hydrated. In no apparent acute distress Vitals:   06/15/18 1136  BP: 125/78  Pulse: 85  Resp: 18  Temp: 98.6 F (37 C)  TempSrc:  Oral  SpO2: 98%  Weight: 278 lb (126.1 kg)  Height: 6' 2"  (1.88 m)   BMI Assessment: Estimated body mass index is 35.69 kg/m as calculated from the following:   Height as of this encounter: 6' 2"  (1.88 m).   Weight as of this encounter: 278 lb (126.1 kg).  BMI interpretation table: BMI level Category Range association with higher incidence of chronic  pain  <18 kg/m2 Underweight   18.5-24.9 kg/m2 Ideal body weight   25-29.9 kg/m2 Overweight Increased incidence by 20%  30-34.9 kg/m2 Obese (Class I) Increased incidence by 68%  35-39.9 kg/m2 Severe obesity (Class II) Increased incidence by 136%  >40 kg/m2 Extreme obesity (Class III) Increased incidence by 254%   Patient's current BMI Ideal Body weight  Body mass index is 35.69 kg/m. Ideal body weight: 82.2 kg (181 lb 3.5 oz) Adjusted ideal body weight: 99.8 kg (219 lb 14.9 oz)   BMI Readings from Last 4 Encounters:  06/15/18 35.69 kg/m  10/29/17 37.47 kg/m  09/24/17 37.34 kg/m  06/05/17 36.84 kg/m   Wt Readings from Last 4 Encounters:  06/15/18 278 lb (126.1 kg)  10/29/17 284 lb (128.8 kg)  09/24/17 283 lb (128.4 kg)  06/05/17 279 lb 3.2 oz (126.6 kg)  Psych/Mental status: Alert, oriented x 3 (person, place, & time)       Eyes: PERLA Respiratory: No evidence of acute respiratory distress  Cervical Spine Area Exam  Skin & Axial Inspection: No masses, redness, edema, swelling, or associated skin lesions Alignment: Symmetrical Functional ROM: Unrestricted ROM      Stability: No instability detected Muscle Tone/Strength: Functionally intact. No obvious neuro-muscular anomalies detected. Sensory (Neurological): Unimpaired Palpation: No palpable anomalies              Upper Extremity (UE) Exam    Side: Right upper extremity  Side: Left upper extremity  Skin & Extremity Inspection: Skin color, temperature, and hair growth are WNL. No peripheral edema or cyanosis. No masses, redness, swelling, asymmetry, or associated skin lesions. No contractures.  Skin & Extremity Inspection: Skin color, temperature, and hair growth are WNL. No peripheral edema or cyanosis. No masses, redness, swelling, asymmetry, or associated skin lesions. No contractures.  Functional ROM: Unrestricted ROM          Functional ROM: Unrestricted ROM          Muscle Tone/Strength: Functionally intact. No obvious  neuro-muscular anomalies detected.  Muscle Tone/Strength: Functionally intact. No obvious neuro-muscular anomalies detected.  Sensory (Neurological): Unimpaired          Sensory (Neurological): Unimpaired          Palpation: No palpable anomalies              Palpation: No palpable anomalies              Provocative Test(s):  Phalen's test: deferred Tinel's test: deferred Apley's scratch test (touch opposite shoulder):  Action 1 (Across chest): deferred Action 2 (Overhead): deferred Action 3 (LB reach): deferred   Provocative Test(s):  Phalen's test: deferred Tinel's test: deferred Apley's scratch test (touch opposite shoulder):  Action 1 (Across chest): deferred Action 2 (Overhead): deferred Action 3 (LB reach): deferred    Thoracic Spine Area Exam  Skin & Axial Inspection: No masses, redness, or swelling Alignment: Symmetrical Functional ROM: Decreased ROM Stability: No instability detected Muscle Tone/Strength: Functionally intact. No obvious neuro-muscular anomalies detected. Sensory (Neurological): Unimpaired Muscle strength & Tone: No palpable anomalies  Lumbar Spine Area Exam  Skin & Axial  Inspection: No masses, redness, or swelling Alignment: Symmetrical Functional ROM: Decreased ROM       Stability: No instability detected Muscle Tone/Strength: Functionally intact. No obvious neuro-muscular anomalies detected. Sensory (Neurological): Dermatomal pain pattern and musculoskeletal Palpation: No palpable anomalies       Provocative Tests: Hyperextension/rotation test: (+) bilaterally for facet joint pain. Lumbar quadrant test (Kemp's test): (+) due to pain. Lateral bending test: (+) due to pain. Patrick's Maneuver: deferred today                   FABER* test: (+) for bilateral hip arthralgia             S-I anterior distraction/compression test: deferred today         S-I lateral compression test: deferred today         S-I Thigh-thrust test: deferred today          S-I Gaenslen's test: deferred today         *(Flexion, ABduction and External Rotation)  Gait & Posture Assessment  Ambulation: Limited Gait: Limited. Using assistive device to ambulate Posture: Difficulty standing up straight, due to pain   Lower Extremity Exam    Side: Right lower extremity  Side: Left lower extremity  Stability: No instability observed          Stability: No instability observed          Skin & Extremity Inspection: Skin color, temperature, and hair growth are WNL. No peripheral edema or cyanosis. No masses, redness, swelling, asymmetry, or associated skin lesions. No contractures.  Skin & Extremity Inspection: Skin color, temperature, and hair growth are WNL. No peripheral edema or cyanosis. No masses, redness, swelling, asymmetry, or associated skin lesions. No contractures.  Functional ROM: Decreased ROM for all joints of the lower extremity          Functional ROM: Decreased ROM for all joints of the lower extremity          Muscle Tone/Strength: Functionally intact. No obvious neuro-muscular anomalies detected.  Muscle Tone/Strength: Functionally intact. No obvious neuro-muscular anomalies detected.  Sensory (Neurological): Arthropathic arthralgia        Sensory (Neurological): Arthropathic arthralgia        DTR: Patellar: 1+: trace Achilles: deferred today Plantar: deferred today  DTR: Patellar: 1+: trace Achilles: deferred today Plantar: deferred today  Palpation: No palpable anomalies  Palpation: No palpable anomalies   Assessment  Primary Diagnosis & Pertinent Problem List: The primary encounter diagnosis was Lumbar degenerative disc disease. Diagnoses of Lumbar radiculopathy, Lumbar facet arthropathy, Lumbar spondylosis, and Generalized neuropathy were also pertinent to this visit.  Visit Diagnosis (New problems to examiner): 1. Lumbar degenerative disc disease   2. Lumbar radiculopathy   3. Lumbar facet arthropathy   4. Lumbar spondylosis   5.  Generalized neuropathy    63 year old male with a chief complaint of axial low back and buttock pain with intermittent left leg pain.  He states that this pain is been present for approximately 10 years.  It radiates into his bilateral buttocks and the superior portion of his left thigh.  Patient has received multiple epidural steroid injections with physical medicine and rehab, bilateral L4-L5 transforaminal ESI of which only the first 1 was effective for 3 to 4 days.  Patient also had bilateral L3 and L4 medial branch nerve blocks performed without any significant pain relief.  Patient also had a left hip injection without any significant improvement.  Patient states that he is off  of Eliquis and has not been on this medication for over 8 months.  Patient has tried physical therapy in the past and did it for 1 month.  Patient used to be a Administrator and does have a history of alcohol abuse in the remote past.  Lower extremity neuropathy related to alcohol intake which was heavy during a period of his life.  He states that he is not abusing alcohol any longer.  Patient was on gabapentin 300 mg 3 times a day with mild to moderate benefit.  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  He is being referred here for consideration of spinal cord stimulator trial.  I had an extensive discussion with the patient about risks and benefits of spinal cord stimulator trial.  I used a spine model to explain percutaneous lead placement.  I also explained to trial versus implant process which would require surgery.  The patient was not aware of how advanced and technical of this procedure would be.  Given the patient's body habitus and obesity, entry would be at L1-L2 or L2-L3.  There is a small disc bulge at L2-L3 which could make placement difficult in this region.  I did explain this to the patient.  I did  provide the patient with resources regarding spinal cord stimulation.  He said he wants to discuss this with his daughter.  This is reasonable.  If patient would like to proceed with spinal cord stimulator trial, he will contact us and I will get the process started which will include thoracic MRI to rule out any thoracic canal stenosis as well as psychological evaluation prior to trial.  Patient endorsed understanding and will follow-up PRN.  Provider-requested follow-up: No follow-ups on file.  No future appointments.  Primary Care Physician: Katheren Shams Location: [facility] Note by: Gillis Santa, M.D, Date: 06/15/2018; Time: 1:25 PM  There are no Patient Instructions on file for this visit.

## 2018-06-15 NOTE — Progress Notes (Signed)
Safety precautions to be maintained throughout the outpatient stay will include: orient to surroundings, keep bed in low position, maintain call bell within reach at all times, provide assistance with transfer out of bed and ambulation.  

## 2018-06-17 ENCOUNTER — Other Ambulatory Visit: Payer: Self-pay

## 2018-06-17 MED ORDER — POTASSIUM CHLORIDE ER 10 MEQ PO TBCR: 10.0000 meq | EXTENDED_RELEASE_TABLET | Freq: Two times a day (BID) | ORAL | 1 refills | Status: DC

## 2018-06-18 ENCOUNTER — Other Ambulatory Visit: Payer: Self-pay

## 2018-06-30 ENCOUNTER — Other Ambulatory Visit: Payer: Self-pay

## 2018-06-30 MED ORDER — OMEPRAZOLE 20 MG PO CPDR: 20.0000 mg | DELAYED_RELEASE_CAPSULE | Freq: Every day | ORAL | 1 refills | Status: DC

## 2018-07-01 ENCOUNTER — Other Ambulatory Visit: Payer: Self-pay

## 2018-07-01 MED ORDER — ROPINIROLE HCL 4 MG PO TABS: 4.0000 mg | ORAL_TABLET | Freq: Three times a day (TID) | ORAL | 0 refills | Status: DC | PRN

## 2018-08-03 ENCOUNTER — Emergency Department
Admission: EM | Admit: 2018-08-03 | Discharge: 2018-08-03 | Disposition: A | Discharge status: Home / Self Care | Payer: BLUE CROSS/BLUE SHIELD | Attending: Emergency Medicine | Admitting: Emergency Medicine

## 2018-08-03 ENCOUNTER — Emergency Department: Payer: BLUE CROSS/BLUE SHIELD

## 2018-08-03 ENCOUNTER — Other Ambulatory Visit: Payer: Self-pay

## 2018-08-03 ENCOUNTER — Encounter: Payer: Self-pay | Admitting: Emergency Medicine

## 2018-08-03 DIAGNOSIS — Z87891 Personal history of nicotine dependence: Secondary | ICD-10-CM | POA: Insufficient documentation

## 2018-08-03 DIAGNOSIS — M25512 Pain in left shoulder: Secondary | ICD-10-CM | POA: Diagnosis not present

## 2018-08-03 DIAGNOSIS — S4992XA Unspecified injury of left shoulder and upper arm, initial encounter: Secondary | ICD-10-CM | POA: Diagnosis not present

## 2018-08-03 MED ORDER — MORPHINE SULFATE (PF) 4 MG/ML IV SOLN
4.0000 mg | Freq: Once | INTRAVENOUS | Status: AC
  Administered 2018-08-03: 4 mg via INTRAVENOUS
  Filled 2018-08-03: qty 1

## 2018-08-03 MED ORDER — ONDANSETRON HCL 4 MG/2ML IJ SOLN
4.0000 mg | Freq: Once | INTRAMUSCULAR | Status: AC
  Administered 2018-08-03: 03:00:00 4 mg via INTRAVENOUS
  Filled 2018-08-03: qty 2

## 2018-08-03 MED ORDER — TRAMADOL HCL 50 MG PO TABS: 50.0000 mg | ORAL_TABLET | Freq: Four times a day (QID) | ORAL | 0 refills | Status: DC | PRN

## 2018-08-03 NOTE — Discharge Instructions (Addendum)
Please seek medical attention for any high fevers, chest pain, shortness of breath, change in behavior, persistent vomiting, bloody stool or any other new or concerning symptoms.  

## 2018-08-03 NOTE — ED Provider Notes (Signed)
[facility] Emergency Department Provider Note    ____________________________________________   I have reviewed the triage vital signs and the nursing notes.   HISTORY  Chief Complaint Fall and Shoulder Pain   History limited by: Not Limited   HPI Craig Morgan is a 63 y.o. male who presents to the emergency department today because of concern for left shoulder pain. The patient stated that he fell getting out of a bobcat roughly 2 hours prior to my evaluation. The patient did state that he deals with leg cramping and he was getting out to stretch his legs. Fell onto his left shoulder. Immediate onset of pain. Worse with attempted movement. Denies hitting his head.   Records reviewed. Per medical record review patient has a history of atrial flutter, atrial fibrillation.  Past Medical History:  Diagnosis Date  . Atrial flutter (HCC) 2012  . Back pain   . DDD (degenerative disc disease), lumbosacral    Back  . Family history of adverse reaction to anesthesia    Mother - PONV  . GERD (gastroesophageal reflux disease)   . Numbness    RIGHT LEG - KNEE TO ANKLE - STATES HE WAS GIVEN INJECTION ONCE FOR SCIATIC PROBLEM AND THE NUMBNESS BEGAN AFTER THE INJECTION.  Marland Kitchen Persistent atrial fibrillation   . Sciatica   . Sleep apnea    uses cpap    Patient Active Problem List   Diagnosis Date Noted  . Lumbosacral radiculopathy due to degenerative joint disease of spine 01/27/2018  . 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 Paroxysmal atrial fibrillation (HCC) 06/02/2017  . Left atrial enlargement 06/12/2015  . OSA on CPAP 11/18/2012  . Open [facility] July [facility] Carolynne Edouard 11/18/2012  . Status post laparoscopic bilateral hernia repair 2010 in [facility] 11/18/2012  . Neuralgia of left inguinal region 11/18/2012  . 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 Persistent atrial fibrillation (HCC) 11/18/2012     Past Surgical History:  Procedure Laterality Date  . ADENOIDECTOMY    . APPENDECTOMY    . CARDIOVERSION  02/04/2012   Procedure: CARDIOVERSION;  Surgeon: Pamella Pert, MD;  Location: [facility] ENDOSCOPY;  Service: Cardiovascular;  Laterality: N/A;  . CARDIOVERSION N/A 05/16/2015   Procedure: CARDIOVERSION;  Surgeon: Yates Decamp, MD;  Location: [facility] ENDOSCOPY;  Service: Cardiovascular;  Laterality: N/A;  . CARDIOVERSION N/A 06/05/2017   Procedure: CARDIOVERSION;  Surgeon: Elder Negus, MD;  Location: [facility] ENDOSCOPY;  Service: Cardiovascular;  Laterality: N/A;  . CARPAL TUNNEL RELEASE Left 09/24/2017   Procedure: CARPAL TUNNEL RELEASE ENDOSCOPIC;  Surgeon: Christena Flake, MD;  Location: [facility] SURGERY [facility]TR;  Service: Orthopedics;  Laterality: Left;  sleep apnea  . CARPAL TUNNEL RELEASE Right 10/29/2017   Procedure: ENDOSCOPIC CARPAL TUNNEL RELEASE WRIST;  Surgeon: Christena Flake, MD;  Location: [facility];  Service: Orthopedics;  Laterality: Right;  sleep apnea  . EYE SURGERY     LASIK EYE SURG X 2 LEFT EYE AND ONCE ON RT EYE  . HEMORROIDECTOMY    . HERNIA REPAIR     LEFT INGUINAL HERNIA;  2ND SURGERY TO DO REVISION LEFT INGUINAL HERNIA AND REPAIR RT INGUINAL HERNIA  . INGUINAL HERNIA REPAIR Left 12/08/2012   Procedure: open left inguinal  EXPLORATION;  Surgeon: Valarie Merino, MD;  Location: [facility] ORS;  Service: General;  Laterality: Left;  . TONSILLECTOMY      Prior to Admission medications   Medication Sig Start Date End  Date Taking? Authorizing Provider  Cholecalciferol (VITAMIN D PO) Take by mouth daily.   Yes [provider]  Cyanocobalamin (VITAMIN B-12 PO) Take by mouth daily.   Yes [provider]  dofetilide (TIKOSYN) 500 MCG capsule Take 1 capsule (500 mcg total) by mouth 2 (two) times daily. 06/05/17  Yes Patwardhan, Manish J, MD  furosemide (LASIX) 40 MG tablet Take 1 tablet (40 mg total) by mouth daily. 06/01/18 08/30/18 Yes Patwardhan, Manish J, MD   Magnesium Oxide -Mg Supplement 400 MG CAPS TAKE 1 CAPSULE BY MOUTH ONCE DAILY 05/11/18  Yes Yates DecampGanji, Jay, MD  omeprazole (PRILOSEC) 20 MG capsule Take 1 capsule (20 mg total) by mouth daily. 06/30/18  Yes Yates DecampGanji, Jay, MD  potassium chloride (K-DUR) 10 MEQ tablet Take 1 tablet (10 mEq total) by mouth 2 (two) times daily. 06/17/18 09/15/18 Yes Patwardhan, Manish J, MD  rOPINIRole (REQUIP) 4 MG tablet Take 1 tablet (4 mg total) by mouth 3 (three) times daily as needed (RLS). 07/01/18  Yes Yates DecampGanji, Jay, MD    Allergies Penicillins  Family History  Problem Relation Age of Onset  . Heart disease Father   . Heart attack Father   . COPD Mother   . Heart attack Mother   . Cancer Maternal Grandfather        lung    Social History Social History   Tobacco Use  . Smoking status: Former Smoker    Last attempt to quit: 1998    Years since quitting: 22.3  . Smokeless tobacco: Current User    Types: Snuff  . Tobacco comment: Uses dips occasionally.  Substance Use Topics  . Alcohol use: Not Currently    Alcohol/week: 0.0 standard drinks    Comment: 3-4 beers per week  . Drug use: No    Review of Systems Constitutional: No fever/chills Eyes: No visual changes. ENT: No sore throat. Cardiovascular: Denies chest pain. Respiratory: Denies shortness of breath. Gastrointestinal: No abdominal pain.  No nausea, no vomiting.  No diarrhea.   Genitourinary: Negative for dysuria. Musculoskeletal: Positive for left shoulder pain, positive for leg cramps.  Skin: Negative for rash. Neurological: Negative for headaches, focal weakness or numbness.  ____________________________________________   PHYSICAL EXAM:  VITAL SIGNS: ED Triage Vitals  Enc Vitals Group     BP 08/03/18 0129 132/81     Pulse Rate 08/03/18 0129 73     Resp --      Temp 08/03/18 0129 98.5 F (36.9 C)     Temp Source 08/03/18 0129 Oral     SpO2 08/03/18 0129 94 %     Weight 08/03/18 0132 275 lb (124.7 kg)     Height 08/03/18 0132  6\' 2"  (1.88 m)     Head Circumference --      Peak Flow --      Pain Score 08/03/18 0132 9   Constitutional: Alert and oriented.  Eyes: Conjunctivae are normal.  ENT      Head: Normocephalic and atraumatic.      Nose: No congestion/rhinnorhea.      Mouth/Throat: Mucous membranes are moist.      Neck: No stridor. Hematological/Lymphatic/Immunilogical: No cervical lymphadenopathy. Cardiovascular: Normal rate, regular rhythm.  No murmurs, rubs, or gallops.  Respiratory: Normal respiratory effort without tachypnea nor retractions. Breath sounds are clear and equal bilaterally. No wheezes/rales/rhonchi. Gastrointestinal: Soft and non tender. No rebound. No guarding.  Genitourinary: Deferred Musculoskeletal: Deformity to left shoulder. Tender to palpation and manipulation.  Neurologic:  Normal speech and  language. No gross focal neurologic deficits are appreciated.  Skin:  Skin is warm, dry and intact. No rash noted. Psychiatric: Mood and affect are normal. Speech and behavior are normal. Patient exhibits appropriate insight and judgment.  ____________________________________________    LABS (pertinent positives/negatives)  None  ____________________________________________   EKG  None  ____________________________________________    RADIOLOGY  Left shoulder No fracture, no dislocation  ____________________________________________   PROCEDURES  Procedures  ____________________________________________   INITIAL IMPRESSION / ASSESSMENT AND PLAN / ED COURSE  Pertinent labs & imaging results that were available during my care of the patient were reviewed by me and considered in my medical decision making (see chart for details).   Patient presented to the emergency department today because of concern for left shoulder pain after a fall. The patient had mild deformity to left shoulder on exam however left shoulder xray without any concerning fracture or dislocation. Do  think at this point patient suffered soft tissue injury. Discussed negative x-ray. Will place patient in sling. Will have patient follow up with orthopedic surgery.  ________________________________________   FINAL CLINICAL IMPRESSION(S) / ED DIAGNOSES  Final diagnoses:  Acute pain of left shoulder     Note: This dictation was prepared with Dragon dictation. Any transcriptional errors that result from this process are unintentional     Phineas Semen, MD 08/03/18 [phone]

## 2018-08-03 NOTE — ED Notes (Signed)
Patient transported to X-ray 

## 2018-08-03 NOTE — ED Triage Notes (Addendum)
Pt says he fell on his left side, injuring his left shoulder; was moving hay on a bobcat around midnight when injury occurred; step-off noted; pt unable to move arm; tender to even touch area; strong radial pulse; able to move fingers; no tenderness to forearm or bicep area;

## 2018-08-05 ENCOUNTER — Other Ambulatory Visit: Payer: Self-pay | Admitting: Orthopedic Surgery

## 2018-08-05 ENCOUNTER — Other Ambulatory Visit (HOSPITAL_COMMUNITY): Payer: Self-pay | Admitting: Orthopedic Surgery

## 2018-08-05 DIAGNOSIS — M75102 Unspecified rotator cuff tear or rupture of left shoulder, not specified as traumatic: Secondary | ICD-10-CM | POA: Diagnosis not present

## 2018-08-05 DIAGNOSIS — R29898 Other symptoms and signs involving the musculoskeletal system: Secondary | ICD-10-CM | POA: Diagnosis not present

## 2018-08-05 DIAGNOSIS — W010XXA Fall on same level from slipping, tripping and stumbling without subsequent striking against object, initial encounter: Secondary | ICD-10-CM | POA: Diagnosis not present

## 2018-08-05 DIAGNOSIS — W19XXXA Unspecified fall, initial encounter: Secondary | ICD-10-CM

## 2018-08-21 ENCOUNTER — Other Ambulatory Visit: Payer: Self-pay

## 2018-08-21 ENCOUNTER — Ambulatory Visit
Admission: RE | Admit: 2018-08-21 | Discharge: 2018-08-21 | Disposition: A | Discharge status: Home / Self Care | Payer: BLUE CROSS/BLUE SHIELD | Source: Ambulatory Visit | Attending: Orthopedic Surgery | Admitting: Orthopedic Surgery

## 2018-08-21 DIAGNOSIS — R29898 Other symptoms and signs involving the musculoskeletal system: Secondary | ICD-10-CM | POA: Diagnosis not present

## 2018-08-21 DIAGNOSIS — W19XXXA Unspecified fall, initial encounter: Secondary | ICD-10-CM | POA: Diagnosis not present

## 2018-08-21 DIAGNOSIS — M19012 Primary osteoarthritis, left shoulder: Secondary | ICD-10-CM | POA: Insufficient documentation

## 2018-08-21 DIAGNOSIS — M75102 Unspecified rotator cuff tear or rupture of left shoulder, not specified as traumatic: Secondary | ICD-10-CM | POA: Diagnosis not present

## 2018-08-21 DIAGNOSIS — M75122 Complete rotator cuff tear or rupture of left shoulder, not specified as traumatic: Secondary | ICD-10-CM | POA: Insufficient documentation

## 2018-09-02 DIAGNOSIS — M75102 Unspecified rotator cuff tear or rupture of left shoulder, not specified as traumatic: Secondary | ICD-10-CM | POA: Diagnosis not present

## 2018-09-10 ENCOUNTER — Other Ambulatory Visit: Payer: Self-pay

## 2018-09-10 ENCOUNTER — Encounter
Admission: RE | Admit: 2018-09-10 | Discharge: 2018-09-10 | Disposition: A | Discharge status: Home / Self Care | Payer: BC Managed Care – PPO | Source: Ambulatory Visit | Attending: Orthopedic Surgery | Admitting: Orthopedic Surgery

## 2018-09-10 DIAGNOSIS — R9431 Abnormal electrocardiogram [ECG] [EKG]: Secondary | ICD-10-CM | POA: Diagnosis not present

## 2018-09-10 DIAGNOSIS — I480 Paroxysmal atrial fibrillation: Secondary | ICD-10-CM | POA: Diagnosis not present

## 2018-09-10 DIAGNOSIS — I4891 Unspecified atrial fibrillation: Secondary | ICD-10-CM | POA: Insufficient documentation

## 2018-09-10 DIAGNOSIS — Z01818 Encounter for other preprocedural examination: Secondary | ICD-10-CM | POA: Insufficient documentation

## 2018-09-10 DIAGNOSIS — Z1159 Encounter for screening for other viral diseases: Secondary | ICD-10-CM | POA: Diagnosis not present

## 2018-09-10 LAB — POTASSIUM: Potassium: 3.9 mmol/L (ref 3.5–5.1)

## 2018-09-10 NOTE — Patient Instructions (Signed)
Your procedure is scheduled on:Mon 6/22 Report to Day Surgery. To find out your arrival time please call [phone] between 1PM - 3PM on Friday 6/19.  Remember: Instructions that are not followed completely may result in serious medical risk,  up to and including death, or upon the discretion of your surgeon and anesthesiologist your  surgery may need to be rescheduled.     _X__ 1. Do not eat food after midnight the night before your procedure.                 No gum chewing or hard candies. You may drink clear liquids up to 2 hours                 before you are scheduled to arrive for your surgery- DO not drink clear                 liquids within 2 hours of the start of your surgery.                 Clear Liquids include:  water, apple juice without pulp, clear carbohydrate                 drink such as Clearfast of Gatorade, Black Coffee or Tea (Do not add                 anything to coffee or tea).  __X__2.  On the morning of surgery brush your teeth with toothpaste and water, you                may rinse your mouth with mouthwash if you wish.  Do not swallow any toothpaste of mouthwash.     _X__ 3.  No Alcohol for 24 hours before or after surgery.   _X__ 4.  Do Not Smoke or use e-cigarettes For 24 Hours Prior to Your Surgery.                 Do not use any chewable tobacco products for at least 6 hours prior to                 surgery.  ____  5.  Bring all medications with you on the day of surgery if instructed.   ___x_  6.  Notify your doctor if there is any change in your medical condition      (cold, fever, infections).     Do not wear jewelry, make-up, hairpins, clips or nail polish. Do not wear lotions, powders, or perfumes. You may wear deodorant. Do not shave 48 hours prior to surgery. Men may shave face and neck. Do not bring valuables to the hospital.    [facility] is not responsible for any belongings or valuables.  Contacts, dentures  or bridgework may not be worn into surgery. Leave your suitcase in the car. After surgery it may be brought to your room. For patients admitted to the hospital, discharge time is determined by your treatment team.   Patients discharged the day of surgery will not be allowed to drive home.   Please read over the following fact sheets that you were given:    __x__ Take these medicines the morning of surgery with A SIP OF WATER:    1. acetaminophen (TYLENOL) 500 MG tablet if needed  2. dofetilide (TIKOSYN) 500 MCG capsule  3. DULoxetine (CYMBALTA) 20 MG capsule  4.omeprazole (PRILOSEC) 20 MG capsule  Take dose the night before and the morning  of surgery  5.  6.  ____ Fleet Enema (as directed)   __x__ Use CHG Soap as directed  ____ Use inhalers on the day of surgery  ____ Stop metformin 2 days prior to surgery    ____ Take 1/2 of usual insulin dose the night before surgery. No insulin the morning          of surgery.   ____ Stop Coumadin/Plavix/aspirin on   __x__ Stop Anti-inflammatories on ibuprofen or Aleve    Tylenol is OK   ____ Stop supplements until after surgery.    _x___ Bring C-Pap to the hospital.

## 2018-09-11 LAB — NOVEL CORONAVIRUS, NAA (HOSP ORDER, SEND-OUT TO REF LAB; TAT 18-24 HRS): SARS-CoV-2, NAA: NOT DETECTED

## 2018-09-13 ENCOUNTER — Other Ambulatory Visit: Payer: Self-pay | Admitting: Cardiology

## 2018-09-13 MED ORDER — CLINDAMYCIN PHOSPHATE 900 MG/50ML IV SOLN
900.0000 mg | Freq: Once | INTRAVENOUS | Status: AC
  Administered 2018-09-14: 08:00:00 900 mg via INTRAVENOUS

## 2018-09-14 ENCOUNTER — Ambulatory Visit
Admission: RE | Admit: 2018-09-14 | Discharge: 2018-09-14 | Disposition: A | Discharge status: Home / Self Care | Payer: BC Managed Care – PPO | Attending: Orthopedic Surgery | Admitting: Orthopedic Surgery

## 2018-09-14 ENCOUNTER — Encounter: Payer: Self-pay | Admitting: Emergency Medicine

## 2018-09-14 ENCOUNTER — Ambulatory Visit: Payer: BC Managed Care – PPO | Admitting: Anesthesiology

## 2018-09-14 ENCOUNTER — Encounter
Admission: RE | Disposition: A | Discharge status: Home / Self Care | Payer: Self-pay | Source: Home / Self Care | Attending: Orthopedic Surgery

## 2018-09-14 ENCOUNTER — Other Ambulatory Visit: Payer: Self-pay

## 2018-09-14 DIAGNOSIS — I4819 Other persistent atrial fibrillation: Secondary | ICD-10-CM | POA: Insufficient documentation

## 2018-09-14 DIAGNOSIS — M75102 Unspecified rotator cuff tear or rupture of left shoulder, not specified as traumatic: Secondary | ICD-10-CM | POA: Insufficient documentation

## 2018-09-14 DIAGNOSIS — W19XXXA Unspecified fall, initial encounter: Secondary | ICD-10-CM | POA: Diagnosis not present

## 2018-09-14 DIAGNOSIS — M75122 Complete rotator cuff tear or rupture of left shoulder, not specified as traumatic: Secondary | ICD-10-CM | POA: Diagnosis not present

## 2018-09-14 DIAGNOSIS — G4730 Sleep apnea, unspecified: Secondary | ICD-10-CM | POA: Diagnosis not present

## 2018-09-14 DIAGNOSIS — M19012 Primary osteoarthritis, left shoulder: Secondary | ICD-10-CM | POA: Insufficient documentation

## 2018-09-14 DIAGNOSIS — M25512 Pain in left shoulder: Secondary | ICD-10-CM | POA: Diagnosis not present

## 2018-09-14 DIAGNOSIS — G4733 Obstructive sleep apnea (adult) (pediatric): Secondary | ICD-10-CM | POA: Diagnosis not present

## 2018-09-14 DIAGNOSIS — M25812 Other specified joint disorders, left shoulder: Secondary | ICD-10-CM | POA: Diagnosis not present

## 2018-09-14 DIAGNOSIS — M65812 Other synovitis and tenosynovitis, left shoulder: Secondary | ICD-10-CM | POA: Insufficient documentation

## 2018-09-14 DIAGNOSIS — K219 Gastro-esophageal reflux disease without esophagitis: Secondary | ICD-10-CM | POA: Diagnosis not present

## 2018-09-14 DIAGNOSIS — Z7901 Long term (current) use of anticoagulants: Secondary | ICD-10-CM | POA: Diagnosis not present

## 2018-09-14 DIAGNOSIS — S46112A Strain of muscle, fascia and tendon of long head of biceps, left arm, initial encounter: Secondary | ICD-10-CM | POA: Insufficient documentation

## 2018-09-14 DIAGNOSIS — M7522 Bicipital tendinitis, left shoulder: Secondary | ICD-10-CM | POA: Diagnosis not present

## 2018-09-14 DIAGNOSIS — F17290 Nicotine dependence, other tobacco product, uncomplicated: Secondary | ICD-10-CM | POA: Insufficient documentation

## 2018-09-14 DIAGNOSIS — Z79899 Other long term (current) drug therapy: Secondary | ICD-10-CM | POA: Diagnosis not present

## 2018-09-14 DIAGNOSIS — G8918 Other acute postprocedural pain: Secondary | ICD-10-CM | POA: Diagnosis not present

## 2018-09-14 DIAGNOSIS — S43432A Superior glenoid labrum lesion of left shoulder, initial encounter: Secondary | ICD-10-CM | POA: Insufficient documentation

## 2018-09-14 HISTORY — PX: SHOULDER ARTHROSCOPY WITH OPEN ROTATOR CUFF REPAIR: SHX6092

## 2018-09-14 SURGERY — ARTHROSCOPY, SHOULDER WITH REPAIR, ROTATOR CUFF, OPEN
Anesthesia: General | Site: Shoulder | Laterality: Left

## 2018-09-14 MED ORDER — DEXMEDETOMIDINE HCL IN NACL 200 MCG/50ML IV SOLN
INTRAVENOUS | Status: AC
  Filled 2018-09-14: qty 50

## 2018-09-14 MED ORDER — FENTANYL CITRATE (PF) 100 MCG/2ML IJ SOLN: 25.0000 ug | INTRAMUSCULAR | Status: DC | PRN

## 2018-09-14 MED ORDER — LIDOCAINE HCL (CARDIAC) PF 100 MG/5ML IV SOSY
PREFILLED_SYRINGE | INTRAVENOUS | Status: DC | PRN
  Administered 2018-09-14: 100 mg via INTRAVENOUS

## 2018-09-14 MED ORDER — ACETAMINOPHEN 500 MG PO TABS: 1000.0000 mg | ORAL_TABLET | Freq: Three times a day (TID) | ORAL | 2 refills | Status: AC

## 2018-09-14 MED ORDER — FENTANYL CITRATE (PF) 100 MCG/2ML IJ SOLN
INTRAMUSCULAR | Status: DC | PRN
  Administered 2018-09-14: 25 ug via INTRAVENOUS
  Administered 2018-09-14: 50 ug via INTRAVENOUS
  Administered 2018-09-14: 25 ug via INTRAVENOUS

## 2018-09-14 MED ORDER — SUGAMMADEX SODIUM 200 MG/2ML IV SOLN
INTRAVENOUS | Status: DC | PRN
  Administered 2018-09-14: 400 mg via INTRAVENOUS

## 2018-09-14 MED ORDER — PHENYLEPHRINE HCL (PRESSORS) 10 MG/ML IV SOLN
INTRAVENOUS | Status: AC
  Filled 2018-09-14: qty 1

## 2018-09-14 MED ORDER — OXYCODONE HCL 5 MG PO TABS: 5.0000 mg | ORAL_TABLET | ORAL | 0 refills | Status: DC | PRN

## 2018-09-14 MED ORDER — SUGAMMADEX SODIUM 500 MG/5ML IV SOLN
INTRAVENOUS | Status: AC
  Filled 2018-09-14: qty 5

## 2018-09-14 MED ORDER — CLINDAMYCIN PHOSPHATE 900 MG/50ML IV SOLN
INTRAVENOUS | Status: AC
  Filled 2018-09-14: qty 50

## 2018-09-14 MED ORDER — PROPOFOL 10 MG/ML IV BOLUS
INTRAVENOUS | Status: AC
  Filled 2018-09-14: qty 40

## 2018-09-14 MED ORDER — ASPIRIN EC 325 MG PO TBEC: 325.0000 mg | DELAYED_RELEASE_TABLET | Freq: Every day | ORAL | 0 refills | Status: AC

## 2018-09-14 MED ORDER — MIDAZOLAM HCL 2 MG/2ML IJ SOLN
1.0000 mg | Freq: Once | INTRAMUSCULAR | Status: AC
  Administered 2018-09-14: 1 mg via INTRAVENOUS

## 2018-09-14 MED ORDER — MIDAZOLAM HCL 2 MG/2ML IJ SOLN
INTRAMUSCULAR | Status: DC | PRN
  Administered 2018-09-14: 1 mg via INTRAVENOUS

## 2018-09-14 MED ORDER — SEVOFLURANE IN SOLN
RESPIRATORY_TRACT | Status: AC
  Filled 2018-09-14: qty 250

## 2018-09-14 MED ORDER — OXYCODONE HCL 5 MG PO TABS: 5.0000 mg | ORAL_TABLET | Freq: Once | ORAL | Status: DC | PRN

## 2018-09-14 MED ORDER — LIDOCAINE HCL (PF) 1 % IJ SOLN
INTRAMUSCULAR | Status: DC | PRN
  Administered 2018-09-14: 3 mL

## 2018-09-14 MED ORDER — OXYCODONE HCL 5 MG/5ML PO SOLN: 5.0000 mg | Freq: Once | ORAL | Status: DC | PRN

## 2018-09-14 MED ORDER — LIDOCAINE HCL (PF) 2 % IJ SOLN
INTRAMUSCULAR | Status: AC
  Filled 2018-09-14: qty 10

## 2018-09-14 MED ORDER — LIDOCAINE HCL (PF) 1 % IJ SOLN
INTRAMUSCULAR | Status: AC
  Filled 2018-09-14: qty 5

## 2018-09-14 MED ORDER — LIDOCAINE-EPINEPHRINE 1 %-1:100000 IJ SOLN
INTRAMUSCULAR | Status: AC
  Filled 2018-09-14: qty 1

## 2018-09-14 MED ORDER — LACTATED RINGERS IV SOLN
INTRAVENOUS | Status: DC | PRN
  Administered 2018-09-14: 36000 mL

## 2018-09-14 MED ORDER — MIDAZOLAM HCL 2 MG/2ML IJ SOLN
INTRAMUSCULAR | Status: AC
  Administered 2018-09-14: 1 mg via INTRAVENOUS
  Filled 2018-09-14: qty 2

## 2018-09-14 MED ORDER — BUPIVACAINE HCL (PF) 0.5 % IJ SOLN
INTRAMUSCULAR | Status: AC
  Filled 2018-09-14: qty 10

## 2018-09-14 MED ORDER — SODIUM CHLORIDE 0.9 % IV SOLN
INTRAVENOUS | Status: DC | PRN
  Administered 2018-09-14: 40 ug via INTRAVENOUS

## 2018-09-14 MED ORDER — ONDANSETRON HCL 4 MG/2ML IJ SOLN
INTRAMUSCULAR | Status: AC
  Filled 2018-09-14: qty 2

## 2018-09-14 MED ORDER — EPHEDRINE SULFATE 50 MG/ML IJ SOLN
INTRAMUSCULAR | Status: DC | PRN
  Administered 2018-09-14 (×6): 5 mg via INTRAVENOUS

## 2018-09-14 MED ORDER — SUGAMMADEX SODIUM 200 MG/2ML IV SOLN
INTRAVENOUS | Status: AC
  Filled 2018-09-14: qty 4

## 2018-09-14 MED ORDER — ROCURONIUM BROMIDE 100 MG/10ML IV SOLN
INTRAVENOUS | Status: DC | PRN
  Administered 2018-09-14: 30 mg via INTRAVENOUS
  Administered 2018-09-14: 50 mg via INTRAVENOUS
  Administered 2018-09-14: 20 mg via INTRAVENOUS
  Administered 2018-09-14: 50 mg via INTRAVENOUS

## 2018-09-14 MED ORDER — EPINEPHRINE (ANAPHYLAXIS) 30 MG/30ML IJ SOLN
INTRAMUSCULAR | Status: AC
  Filled 2018-09-14: qty 30

## 2018-09-14 MED ORDER — BUPIVACAINE LIPOSOME 1.3 % IJ SUSP
INTRAMUSCULAR | Status: DC | PRN
  Administered 2018-09-14: 20 mL via PERINEURAL

## 2018-09-14 MED ORDER — MIDAZOLAM HCL 2 MG/2ML IJ SOLN
INTRAMUSCULAR | Status: AC
  Filled 2018-09-14: qty 2

## 2018-09-14 MED ORDER — SODIUM CHLORIDE 0.9 % IV SOLN
INTRAVENOUS | Status: DC | PRN
  Administered 2018-09-14: 100 ug via INTRAVENOUS
  Administered 2018-09-14: 30 ug via INTRAVENOUS
  Administered 2018-09-14: 150 ug via INTRAVENOUS
  Administered 2018-09-14: 100 ug via INTRAVENOUS
  Administered 2018-09-14: 200 ug via INTRAVENOUS
  Administered 2018-09-14: 100 ug via INTRAVENOUS
  Administered 2018-09-14: 50 ug via INTRAVENOUS
  Administered 2018-09-14 (×2): 200 ug via INTRAVENOUS
  Administered 2018-09-14: 100 ug via INTRAVENOUS
  Administered 2018-09-14: 150 ug via INTRAVENOUS

## 2018-09-14 MED ORDER — PROPOFOL 10 MG/ML IV BOLUS
INTRAVENOUS | Status: DC | PRN
  Administered 2018-09-14: 150 mg via INTRAVENOUS

## 2018-09-14 MED ORDER — ONDANSETRON 4 MG PO TBDP: 4.0000 mg | ORAL_TABLET | Freq: Three times a day (TID) | ORAL | 0 refills | Status: DC | PRN

## 2018-09-14 MED ORDER — DEXMEDETOMIDINE HCL IN NACL 200 MCG/50ML IV SOLN
INTRAVENOUS | Status: DC | PRN
  Administered 2018-09-14: 8 ug via INTRAVENOUS
  Administered 2018-09-14 (×3): 4 ug via INTRAVENOUS

## 2018-09-14 MED ORDER — FENTANYL CITRATE (PF) 100 MCG/2ML IJ SOLN
50.0000 ug | Freq: Once | INTRAMUSCULAR | Status: AC
  Administered 2018-09-14: 50 ug via INTRAVENOUS

## 2018-09-14 MED ORDER — ROCURONIUM BROMIDE 50 MG/5ML IV SOLN
INTRAVENOUS | Status: AC
  Filled 2018-09-14: qty 3

## 2018-09-14 MED ORDER — BUPIVACAINE HCL (PF) 0.5 % IJ SOLN
INTRAMUSCULAR | Status: DC | PRN
  Administered 2018-09-14: 10 mL via PERINEURAL

## 2018-09-14 MED ORDER — FENTANYL CITRATE (PF) 100 MCG/2ML IJ SOLN
INTRAMUSCULAR | Status: AC
  Administered 2018-09-14: 50 ug via INTRAVENOUS
  Filled 2018-09-14: qty 2

## 2018-09-14 MED ORDER — MEPERIDINE HCL 50 MG/ML IJ SOLN: 6.2500 mg | INTRAMUSCULAR | Status: DC | PRN

## 2018-09-14 MED ORDER — EPHEDRINE SULFATE 50 MG/ML IJ SOLN
INTRAMUSCULAR | Status: AC
  Filled 2018-09-14: qty 1

## 2018-09-14 MED ORDER — ONDANSETRON HCL 4 MG/2ML IJ SOLN
INTRAMUSCULAR | Status: DC | PRN
  Administered 2018-09-14: 4 mg via INTRAVENOUS

## 2018-09-14 MED ORDER — LACTATED RINGERS IV SOLN
INTRAVENOUS | Status: DC
  Administered 2018-09-14 (×2): via INTRAVENOUS

## 2018-09-14 MED ORDER — FENTANYL CITRATE (PF) 100 MCG/2ML IJ SOLN
INTRAMUSCULAR | Status: AC
  Filled 2018-09-14: qty 2

## 2018-09-14 MED ORDER — BUPIVACAINE LIPOSOME 1.3 % IJ SUSP
INTRAMUSCULAR | Status: AC
  Filled 2018-09-14: qty 20

## 2018-09-14 MED ORDER — PROMETHAZINE HCL 25 MG/ML IJ SOLN: 6.2500 mg | INTRAMUSCULAR | Status: DC | PRN

## 2018-09-14 SURGICAL SUPPLY — 93 items
ADAPTER IRRIG TUBE 2 SPIKE SOL (ADAPTER) ×4 IMPLANT
ADH SKN CLS APL DERMABOND .7 (GAUZE/BANDAGES/DRESSINGS)
ADPR TBG 2 SPK PMP STRL ASCP (ADAPTER) ×2
ANCH SUT 1.3 FBRTK 2LD BLU WHT (Anchor) ×1 IMPLANT
ANCH SUT 2X2.3 TAPE (Anchor) ×2 IMPLANT
ANCH SUT SWLK 19.1X4.75 (Anchor) ×2 IMPLANT
ANCHOR 2.3 SP SGL 1.2 XBRAID (Anchor) ×2 IMPLANT
ANCHOR SUT BIO SW 4.75X19.1 (Anchor) ×2 IMPLANT
ANCHOR SUT FBRTK SUTURETAP 1.3 (Anchor) ×1 IMPLANT
APL PRP STRL LF DISP 70% ISPRP (MISCELLANEOUS) ×1
BLADE OSCILLATING/SAGITTAL (BLADE)
BLADE SW THK.38XMED LNG THN (BLADE) IMPLANT
BUR BR 5.5 12 FLUTE (BURR) IMPLANT
BUR RADIUS 4.0X18.5 (BURR) IMPLANT
CANNULA PART THRD DISP 5.75X7 (CANNULA) IMPLANT
CANNULA PARTIAL THREAD 2X7 (CANNULA) IMPLANT
CANNULA TWIST IN 8.25X9CM (CANNULA) IMPLANT
CHLORAPREP W/TINT 26 (MISCELLANEOUS) ×2 IMPLANT
COOLER POLAR GLACIER W/PUMP (MISCELLANEOUS) ×2 IMPLANT
COVER LIGHT HANDLE STERIS (MISCELLANEOUS) ×2 IMPLANT
COVER WAND RF STERILE (DRAPES) ×2 IMPLANT
CRADLE LAMINECT ARM (MISCELLANEOUS) ×4 IMPLANT
DERMABOND ADVANCED (GAUZE/BANDAGES/DRESSINGS)
DERMABOND ADVANCED .7 DNX12 (GAUZE/BANDAGES/DRESSINGS) IMPLANT
DRAPE IMP U-DRAPE 54X76 (DRAPES) ×4 IMPLANT
DRAPE INCISE IOBAN 66X45 STRL (DRAPES) ×2 IMPLANT
DRAPE SHEET LG 3/4 BI-LAMINATE (DRAPES) ×2 IMPLANT
DRAPE STERI 35X30 U-POUCH (DRAPES) ×2 IMPLANT
DRAPE U-SHAPE 47X51 STRL (DRAPES) ×2 IMPLANT
DRSG TEGADERM 2X2.25 PEDS (GAUZE/BANDAGES/DRESSINGS) ×1 IMPLANT
DRSG TEGADERM 4X4.75 (GAUZE/BANDAGES/DRESSINGS) ×1 IMPLANT
ELECT REM PT RETURN 9FT ADLT (ELECTROSURGICAL) ×2
ELECTRODE REM PT RTRN 9FT ADLT (ELECTROSURGICAL) ×1 IMPLANT
GAUZE SPONGE 4X4 12PLY STRL (GAUZE/BANDAGES/DRESSINGS) ×2 IMPLANT
GAUZE XEROFORM 1X8 LF (GAUZE/BANDAGES/DRESSINGS) ×2 IMPLANT
GLOVE BIOGEL PI IND STRL 7.5 (GLOVE) IMPLANT
GLOVE BIOGEL PI IND STRL 8 (GLOVE) ×1 IMPLANT
GLOVE BIOGEL PI INDICATOR 7.5 (GLOVE) ×1
GLOVE BIOGEL PI INDICATOR 8 (GLOVE) ×2
GLOVE BIOGEL PI ORTHO PRO 7.5 (GLOVE) ×1
GLOVE PI ORTHO PRO STRL 7.5 (GLOVE) IMPLANT
GLOVE SURG SYN 8.0 (GLOVE) ×2 IMPLANT
GLOVE SURG SYN 8.0 PF PI (GLOVE) ×1 IMPLANT
GOWN STRL REUS W/ TWL LRG LVL3 (GOWN DISPOSABLE) ×1 IMPLANT
GOWN STRL REUS W/TWL LRG LVL3 (GOWN DISPOSABLE) ×2
GOWN STRL REUS W/TWL LRG LVL4 (GOWN DISPOSABLE) ×2 IMPLANT
IV LACTATED RINGER IRRG 3000ML (IV SOLUTION) ×14
IV LR IRRIG 3000ML ARTHROMATIC (IV SOLUTION) ×4 IMPLANT
KIT SPEAR STR 1.6MM DRILL (MISCELLANEOUS) ×2 IMPLANT
KIT STABILIZATION SHOULDER (MISCELLANEOUS) ×2 IMPLANT
KIT TURNOVER KIT A (KITS) ×2 IMPLANT
MANIFOLD NEPTUNE II (INSTRUMENTS) ×2 IMPLANT
MASK FACE SPIDER DISP (MASK) ×2 IMPLANT
MAT ABSORB  FLUID 56X50 GRAY (MISCELLANEOUS) ×2
MAT ABSORB FLUID 56X50 GRAY (MISCELLANEOUS) ×2 IMPLANT
NDL MAYO 6 CRC TAPER PT (NEEDLE) IMPLANT
NDL SAFETY ECLIPSE 18X1.5 (NEEDLE) ×1 IMPLANT
NDL SCORPION MULTI FIRE (NEEDLE) IMPLANT
NEEDLE HYPO 18GX1.5 SHARP (NEEDLE) ×2
NEEDLE HYPO 22GX1.5 SAFETY (NEEDLE) ×2 IMPLANT
NEEDLE MAYO 6 CRC TAPER PT (NEEDLE) IMPLANT
NEEDLE SCORPION MULTI FIRE (NEEDLE) ×2 IMPLANT
PACK ARTHROSCOPY SHOULDER (MISCELLANEOUS) ×2 IMPLANT
PAD ABD DERMACEA PRESS 5X9 (GAUZE/BANDAGES/DRESSINGS) ×2 IMPLANT
PAD WRAPON POLAR SHDR XLG (MISCELLANEOUS) ×1 IMPLANT
SET TUBE SUCT SHAVER OUTFL 24K (TUBING) ×2 IMPLANT
SET TUBE TIP INTRA-ARTICULAR (MISCELLANEOUS) ×2 IMPLANT
SLING ULTRA II LG (MISCELLANEOUS) ×1 IMPLANT
SLING ULTRA II M (MISCELLANEOUS) ×2 IMPLANT
SPONGE GAUZE 2X2 8PLY STRL LF (GAUZE/BANDAGES/DRESSINGS) ×1 IMPLANT
STAPLER SKIN PROX 35W (STAPLE) IMPLANT
STRAP SAFETY 5IN WIDE (MISCELLANEOUS) ×2 IMPLANT
STRIP CLOSURE SKIN 1/2X4 (GAUZE/BANDAGES/DRESSINGS) IMPLANT
SUT ETHILON 3-0 (SUTURE) IMPLANT
SUT ETHILON 4-0 (SUTURE) ×2
SUT ETHILON 4-0 FS2 18XMFL BLK (SUTURE) ×1
SUT LASSO 90 DEG SD STR (SUTURE) IMPLANT
SUT MNCRL 4-0 (SUTURE)
SUT MNCRL 4-0 27XMFL (SUTURE)
SUT PROLENE 0 CT 2 (SUTURE) IMPLANT
SUT TICRON 2-0 30IN 311381 (SUTURE) IMPLANT
SUT VIC AB 0 CT1 36 (SUTURE) IMPLANT
SUT VIC AB 2-0 CT2 27 (SUTURE) IMPLANT
SUT VICRYL 3-0 27IN (SUTURE) IMPLANT
SUTURE ETHLN 4-0 FS2 18XMF BLK (SUTURE) ×1 IMPLANT
SUTURE MNCRL 4-0 27XMF (SUTURE) IMPLANT
SYR 10ML LL (SYRINGE) ×2 IMPLANT
TAPE CLOTH 3X10 WHT NS LF (GAUZE/BANDAGES/DRESSINGS) ×2 IMPLANT
TAPE MICROFOAM 4IN (TAPE) ×2 IMPLANT
TUBING ARTHRO INFLOW-ONLY STRL (TUBING) ×2 IMPLANT
TUBING CONNECTING 10 (TUBING) ×2 IMPLANT
WAND WEREWOLF FLOW 90D (MISCELLANEOUS) ×2 IMPLANT
WRAPON POLAR PAD SHDR XLG (MISCELLANEOUS) ×2

## 2018-09-14 NOTE — Anesthesia Preprocedure Evaluation (Addendum)
Anesthesia Evaluation  Patient identified by MRN, date of birth, ID band Patient awake    Reviewed: Allergy & Precautions, NPO status , Patient's Chart, lab work & pertinent test results  History of Anesthesia Complications Negative for: history of anesthetic complications  Airway Mallampati: III  TM Distance: >3 FB Neck ROM: Full    Dental  (+) Poor Dentition, Missing   Pulmonary sleep apnea and Continuous Positive Airway Pressure Ventilation , neg COPD, former smoker,    breath sounds clear to auscultation- rhonchi (-) wheezing      Cardiovascular (-) hypertension(-) CAD, (-) Past MI, (-) Cardiac Stents and (-) CABG + dysrhythmias Atrial Fibrillation  Rhythm:Regular Rate:Normal - Systolic murmurs and - Diastolic murmurs    Neuro/Psych neg Seizures negative neurological ROS  negative psych ROS   GI/Hepatic Neg liver ROS, GERD  ,  Endo/Other  negative endocrine ROSneg diabetes  Renal/GU negative Renal ROS     Musculoskeletal  (+) Arthritis ,   Abdominal (+) + obese,   Peds  Hematology negative hematology ROS (+)   Anesthesia Other Findings Past Medical History: 2012: Atrial flutter (HCC) No date: Back pain No date: DDD (degenerative disc disease), lumbosacral     Comment:  Back No date: Dysrhythmia     Comment:  history of A FIb No date: Family history of adverse reaction to anesthesia     Comment:  Mother - PONV No date: GERD (gastroesophageal reflux disease) No date: Numbness     Comment:  RIGHT LEG - KNEE TO ANKLE - STATES HE WAS GIVEN               INJECTION ONCE FOR SCIATIC PROBLEM AND THE NUMBNESS BEGAN              AFTER THE INJECTION. No date: Persistent atrial fibrillation No date: Sciatica No date: Sleep apnea     Comment:  uses cpap   Reproductive/Obstetrics                             Anesthesia Physical Anesthesia Plan  ASA: III  Anesthesia Plan: General    Post-op Pain Management:  Regional for Post-op pain   Induction: Intravenous  PONV Risk Score and Plan: 1 and Ondansetron and Midazolam  Airway Management Planned: Oral ETT  Additional Equipment:   Intra-op Plan:   Post-operative Plan: Extubation in OR  Informed Consent: I have reviewed the patients History and Physical, chart, labs and discussed the procedure including the risks, benefits and alternatives for the proposed anesthesia with the patient or authorized representative who has indicated his/her understanding and acceptance.     Dental advisory given  Plan Discussed with: CRNA and Anesthesiologist  Anesthesia Plan Comments: (Discussed with patient absolute need to wear CPAP while nerve block is working due to effect of nerve block on diaphragm function)       Anesthesia Quick Evaluation

## 2018-09-14 NOTE — Anesthesia Postprocedure Evaluation (Signed)
Anesthesia Post Note  Patient: Craig Morgan  Procedure(s) Performed: LEFT SHOULDER ARTHROSCOPY WITH MINIE  OPEN ROTATOR CUFF REPAIR, BICEPS TENODESIS, DISTAL CLAVICLE EXCISION, SUBACROMIAL DECOMPRESSION, SLEEP APNEA (Left Shoulder)  Patient location during evaluation: PACU Anesthesia Type: General Level of consciousness: awake and alert and oriented Pain management: pain level controlled Vital Signs Assessment: post-procedure vital signs reviewed and stable Respiratory status: spontaneous breathing, nonlabored ventilation and respiratory function stable Cardiovascular status: blood pressure returned to baseline and stable Postop Assessment: no signs of nausea or vomiting Anesthetic complications: no     Last Vitals:  Vitals:   09/14/18 1157 09/14/18 1158  BP:    Pulse:    Resp: 15   Temp:  (P) 36.6 C  SpO2: 96% (P) 95%    Last Pain:  Vitals:   09/14/18 1158  PainSc: (P) 0-No pain                 Jakeb Lamping

## 2018-09-14 NOTE — Transfer of Care (Signed)
Immediate Anesthesia Transfer of Care Note  Patient: Craig Morgan  Procedure(s) Performed: LEFT SHOULDER ARTHROSCOPY WITH MINIE  OPEN ROTATOR CUFF REPAIR, BICEPS TENODESIS, DISTAL CLAVICLE EXCISION, SUBACROMIAL DECOMPRESSION, SLEEP APNEA (Left Shoulder)  Patient Location: PACU  Anesthesia Type:General  Level of Consciousness: drowsy  Airway & Oxygen Therapy: Patient Spontanous Breathing and Patient connected to face mask oxygen  Post-op Assessment: Report given to RN and Post -op Vital signs reviewed and stable  Post vital signs: stable  Last Vitals:  Vitals Value Taken Time  BP 120/77 09/14/18 1115  Temp 36.5 C 09/14/18 1115  Pulse 101 09/14/18 1119  Resp 23 09/14/18 1119  SpO2 99 % 09/14/18 1119  Vitals shown include unvalidated device data.  Last Pain:  Vitals:   09/14/18 1115  PainSc: (P) 0-No pain         Complications: No apparent anesthesia complications

## 2018-09-14 NOTE — Anesthesia Procedure Notes (Signed)
Anesthesia Regional Block: Interscalene brachial plexus block   Pre-Anesthetic Checklist: ,, timeout performed, Correct Patient, Correct Site, Correct Laterality, Correct Procedure, Correct Position, site marked, Risks and benefits discussed,  Surgical consent,  Pre-op evaluation,  At surgeon's request and post-op pain management  Laterality: Left  Prep: chloraprep       Needles:  Injection technique: Single-shot  Needle Type: Stimiplex     Needle Length: 9cm  Needle Gauge: 21     Additional Needles:   Procedures:,,,, ultrasound used (permanent image in chart),,,,  Narrative:  Start time: 09/14/2018 7:19 AM End time: 09/14/2018 7:24 AM Injection made incrementally with aspirations every 5 mL.  Performed by: Personally   Additional Notes: Functioning IV was confirmed and monitors were applied.  A Stimuplex needle was used. Sterile prep and drape,hand hygiene and sterile gloves were used.  Negative aspiration and negative test dose prior to incremental administration of local anesthetic. The patient tolerated the procedure well.

## 2018-09-14 NOTE — H&P (Signed)
Paper H&P to be scanned into permanent record. H&P reviewed. No significant changes noted.  

## 2018-09-14 NOTE — Op Note (Signed)
SURGERY DATE: 09/14/2018  PRE-OP DIAGNOSIS:  1. Left subacromial impingement 2. Left biceps tendinopathy 3. Left rotator cuff tear 4. Left acromioclavicular joint osteoarthritis  POST-OP DIAGNOSIS: 1. Left subacromial impingement 2. Left biceps tendinopathy 3. Left rotator cuff tear 4. Left acromioclavicular joint osteoarthritis  PROCEDURES:  1. Left mini-open rotator cuff repair 2. Left open biceps tenodesis 3. Left arthroscopic distal clavicle excision 4. Left arthroscopic extensive debridement of shoulder (glenohumeral and subacromial spaces) 5. Left arthroscopic subacromial decompression  SURGEON: Rosealee AlbeeSunny H. Viraat Vanpatten, MD  ASSISTANT: Sonny DandyJ. Todd Mundy, PA  ANESTHESIA: Gen with Exparil interscalene block  ESTIMATED BLOOD LOSS: 25cc  DRAINS:  none  TOTAL IV FLUIDS: per anesthesia   SPECIMENS: none  IMPLANTS:  - Arthrex 4.1975mm SwiveLock x2 - Arthrex 1.738mm FiberTak - Iconix SPEED double loaded with 1.2 and 2.520mm tape x 2   OPERATIVE FINDINGS:  Examination under anesthesia: A careful examination under anesthesia was performed.  Passive range of motion was: FF: 150; ER at side: 60; ER in abduction: 90; IR in abduction: 45.  Anterior load shift: NT.  Posterior load shift: NT.  Sulcus in neutral: NT.  Sulcus in ER: NT.    Intra-operative findings: A thorough arthroscopic examination of the shoulder was performed.  The findings are: 1. Biceps tendon: tendinopathy with significant erythema and split tearing extraarticularly  2. Superior labrum: injected with surrounding synovitis, type II SLAP tear 3. Posterior labrum and capsule: normal 4. Inferior capsule and inferior recess: normal 5. Glenoid cartilage surface: Grade 1 changes with mild surface fibrillation superiorly 6. Supraspinatus attachment: full-thickness tear 7. Posterior rotator cuff attachment: normal 8. Humeral head articular cartilage: normal 9. Rotator interval: significant synovitis 10: Subscapularis tendon:  attachment intact 11. Anterior labrum: degenerative 12. IGHL: normal  OPERATIVE REPORT:   Indications for procedure: Fransisco BeauRonnie L Bakos is a 63 y.o. male who had a fall ~7 weeks ago and had L shoulder pain with inability to lift arm over his head. He did not have any L shoulder pain or dysfunction prior to this injury. Clinical exam and MRI were suggestive of rotator cuff tear, biceps tendinopathy, subacromial impingement, and acromioclavicular joint arthritis. After discussion of risks, benefits, and alternatives to surgery, the patient elected to proceed.   Procedure in detail:  I identified Briton L Glockner in the pre-operative holding area.  I marked the operative shoulder with my initials. I reviewed the risks and benefits of the proposed surgical intervention, and the patient (and/or patient's guardian) wished to proceed.  Anesthesia was then performed with an Exparil interscalene block.  The patient was transferred to the operative suite and placed in the beach chair position.    SCDs were placed on the lower extremities. Appropriate IV antibiotics were administered prior to incision. The operative upper extremity was then prepped and draped in standard fashion. A time out was performed confirming the correct extremity, correct patient, and correct procedure.   I then created a standard posterior portal with an 11 blade. The glenohumeral joint was easily entered with a blunt trochar and the arthroscope introduced. The findings of diagnostic arthroscopy are described above. I debrided degenerative tissue including the synovitic tissue about the rotator interval and anterior and superior labrum. I then coagulated the inflamed synovium to obtain hemostasis and reduce the risk of post-operative swelling using an Arthrocare radiofrequency device. I performed a biceps tenotomy using an arthroscopic scissors and used a motorized shaver to debride the stump back to a stable base.   Next, the arthroscope  was  then introduced into the subacromial space. A direct lateral portal was created with an 11-blade after spinal needle localization. An extensive subacromial bursectomy was performed using a combination of the shaver and Arthrocare wand. The entire acromial undersurface was exposed and the CA ligament was subperiosteally elevated to expose the anterior acromial hook. A 5.1005mm barrel burr was used to create a flat anterior and lateral aspect of the acromion, converting it from a Type 2 to a Type 1 acromion. Care was made to keep the deltoid fascia intact.  I then turned my attention to the arthroscopic distal clavicle excision. I identified the acromioclavicular joint. Surrounding bursal tissue was debrided and the edges of the joint were identified. I used the 5.255mm barrel burr to remove the distal clavicle parallel to the edge of the acromion. I was able to fit two widths of the burr into the space between the distal clavicle and acromion, signifying that I had removed ~3211mm of distal clavicle. This was confirmed by viewing anteriorly and introducing a probe with measuring marks from the lateral portal.   A longitudinal incision from the anterolateral acromion ~7cm in length was made overlying the raphe between the anterior and middle heads of the deltoid. The raphe was identified and it was incised. The subacromial space was identified. Any remaining bursa was excised. The rotator cuff tear was identified. It was a large U-shaped tear involving the entirety of the supraspinatus and the anterior portion of the infraspinatus. Additionally, there was a delaminated component of the tear as well.   We then turned our attention to the biceps tenodesis. The arm was externally rotated.  The bicipital groove was identified.  A 15 blade was used to make a cut overlying the biceps tendon, and the tendon was removed using a right angle clamp.  The base of the bicipital groove was identified and cleared of soft tissue.   A FiberTak anchor was placed in the bicipital groove.  The biceps tendon was held at the appropriate amount of tension.  One set of sutures was passed through the biceps anchor with one limb passed in a simple fashion and the second limb passed in a simple plus locking stitch pattern.  This was repeated for the other set of sutures.  This construct allowed for shuttling the biceps tendon down to the bone.  The sutures were tied and cut.  The diseased portion of the proximal biceps was then excised.  The arm was then internally rotated.  The rotator cuff footprint was cleared of soft tissue. A rongeur was used to gently decorticate the rotator cuff footprint to allow for improved healing. Two Iconix SPEED anchors were placed just lateral to the articular margin.  The rotator cuff was mobilized using key elevators both superior and inferior to the tear. The rotator cuff was able to be reduced to its footprint and then held in a reduced position with graspers. All 8 strands of suture from the medial row anchors were passed through the rotator cuff in an appropriate fashion. Two SwiveLock anchors were placed for the lateral row anchors with one limb of each pair of sutures of the medial row passed through each anchor.  The posterior and anterior lateral row anchor #2 FiberWires were additionally passed through a dog ear in the posterior and anterior aspects of the rotator cuff, respectively, to further reapproximate the tear. This construct was stable with external and internal rotation and allowed for excellent anatomic reapproximation of the rotator cuff over its footprint  without undue tension.  The wound was thoroughly irrigated.  The deltoid split was closed with 0 Vicryl.  The subdermal layer was closed with 2-0 Vicryl.  The skin was closed with 4-0 Monocryl and Dermabond. The portals were closed with 3-0 Nylon. Xeroform was applied to the incisions. A sterile dressing was applied, followed by a Polar Care  sleeve and a SlingShot shoulder immobilizer/sling. The patient was awakened from anesthesia without difficulty and was transferred to the PACU in stable condition.     COMPLICATIONS: none  DISPOSITION: plan for discharge home after recovery in PACU   POSTOPERATIVE PLAN: Remain in sling (except hygiene and elbow/wrist/hand RoM exercises as instructed by PT) x 6 weeks and NWB for this time. PT to begin 3-4 days after surgery. Rotator cuff repair and biceps tenodesis rehab protocol. ASA 325mg  daily x 2 weeks for DVT ppx.

## 2018-09-14 NOTE — Anesthesia Post-op Follow-up Note (Signed)
Anesthesia QCDR form completed.        

## 2018-09-14 NOTE — Anesthesia Procedure Notes (Signed)
Procedure Name: Intubation Date/Time: 09/14/2018 7:50 AM Performed by: Lavone Orn, CRNA Pre-anesthesia Checklist: Patient identified, Emergency Drugs available, Suction available, Patient being monitored and Timeout performed Patient Re-evaluated:Patient Re-evaluated prior to induction Oxygen Delivery Method: Circle system utilized Preoxygenation: Pre-oxygenation with 100% oxygen Induction Type: IV induction Ventilation: Mask ventilation without difficulty and Oral airway inserted - appropriate to patient size Laryngoscope Size: McGraph and 4 Grade View: Grade II Tube type: Oral Number of attempts: 2 Airway Equipment and Method: Stylet and Video-laryngoscopy Placement Confirmation: ETT inserted through vocal cords under direct vision,  positive ETCO2 and breath sounds checked- equal and bilateral Secured at: 23 cm Tube secured with: Tape Dental Injury: Teeth and Oropharynx as per pre-operative assessment

## 2018-09-14 NOTE — Discharge Instructions (Signed)

## 2018-09-16 ENCOUNTER — Encounter: Payer: Self-pay | Admitting: Orthopedic Surgery

## 2018-09-17 DIAGNOSIS — M25612 Stiffness of left shoulder, not elsewhere classified: Secondary | ICD-10-CM | POA: Diagnosis not present

## 2018-09-17 DIAGNOSIS — S46012D Strain of muscle(s) and tendon(s) of the rotator cuff of left shoulder, subsequent encounter: Secondary | ICD-10-CM | POA: Diagnosis not present

## 2018-09-17 DIAGNOSIS — M25512 Pain in left shoulder: Secondary | ICD-10-CM | POA: Diagnosis not present

## 2018-09-17 DIAGNOSIS — M6281 Muscle weakness (generalized): Secondary | ICD-10-CM | POA: Diagnosis not present

## 2018-09-24 DIAGNOSIS — S46012D Strain of muscle(s) and tendon(s) of the rotator cuff of left shoulder, subsequent encounter: Secondary | ICD-10-CM | POA: Diagnosis not present

## 2018-10-07 ENCOUNTER — Other Ambulatory Visit: Payer: Self-pay | Admitting: Cardiology

## 2018-10-07 NOTE — Telephone Encounter (Signed)
Please fill

## 2018-10-09 DIAGNOSIS — S46012D Strain of muscle(s) and tendon(s) of the rotator cuff of left shoulder, subsequent encounter: Secondary | ICD-10-CM | POA: Diagnosis not present

## 2018-10-14 ENCOUNTER — Other Ambulatory Visit: Payer: Self-pay

## 2018-10-14 DIAGNOSIS — I4819 Other persistent atrial fibrillation: Secondary | ICD-10-CM

## 2018-10-14 MED ORDER — ROPINIROLE HCL 4 MG PO TABS: 4.0000 mg | ORAL_TABLET | Freq: Three times a day (TID) | ORAL | 0 refills | Status: DC | PRN

## 2018-10-17 DIAGNOSIS — Z20828 Contact with and (suspected) exposure to other viral communicable diseases: Secondary | ICD-10-CM | POA: Diagnosis not present

## 2018-11-02 ENCOUNTER — Encounter: Payer: Self-pay | Admitting: Cardiology

## 2018-11-02 ENCOUNTER — Other Ambulatory Visit: Payer: Self-pay

## 2018-11-02 ENCOUNTER — Ambulatory Visit (INDEPENDENT_AMBULATORY_CARE_PROVIDER_SITE_OTHER): Payer: BC Managed Care – PPO | Admitting: Cardiology

## 2018-11-02 VITALS — BP 131/86 | HR 70 | Ht 73.0 in | Wt 273.4 lb

## 2018-11-02 DIAGNOSIS — R7303 Prediabetes: Secondary | ICD-10-CM | POA: Diagnosis not present

## 2018-11-02 DIAGNOSIS — E782 Mixed hyperlipidemia: Secondary | ICD-10-CM

## 2018-11-02 DIAGNOSIS — I480 Paroxysmal atrial fibrillation: Secondary | ICD-10-CM

## 2018-11-02 DIAGNOSIS — Z9989 Dependence on other enabling machines and devices: Secondary | ICD-10-CM

## 2018-11-02 DIAGNOSIS — Z5181 Encounter for therapeutic drug level monitoring: Secondary | ICD-10-CM

## 2018-11-02 DIAGNOSIS — G4733 Obstructive sleep apnea (adult) (pediatric): Secondary | ICD-10-CM

## 2018-11-02 MED ORDER — MAGNESIUM OXIDE -MG SUPPLEMENT 400 MG PO CAPS: 1.0000 | ORAL_CAPSULE | Freq: Every day | ORAL | 1 refills | Status: DC

## 2018-11-02 NOTE — Progress Notes (Signed)
Primary Physician/Referring:  Patient, No Pcp Per  Patient ID: Craig Morgan, male    DOB: 1955-06-25, 63 y.o.   MRN: [id number]  Chief Complaint  Patient presents with  . Atrial Fibrillation  . Follow-up    22mo  HPI:    HPI: Craig Morgan is a 63y.o. Caucasian male on tikosyn for symptomatic Afib, leg edmea, here for follow up.  He is presently doing well with regard to atrial flutter fibrillation without palpitations of fatigue, he does have chronic fatigue and has been using CPAP but not as regularly.  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  Past Medical History:  Diagnosis Date  . Atrial flutter ([facility] 2012  . Back pain   . DDD (degenerative disc disease), lumbosacral    Back  . Dysrhythmia    history of A FIb  . Family history of adverse reaction to anesthesia    Mother - PONV  . GERD (gastroesophageal reflux disease)   . Numbness    RIGHT LEG - KNEE TO ANKLE - STATES HE WAS GIVEN INJECTION ONCE FOR SCIATIC PROBLEM AND THE NUMBNESS BEGAN AFTER THE INJECTION.  .Marland KitchenPersistent atrial fibrillation   . Sciatica   . Sleep apnea    uses cpap   Past Surgical History:  Procedure Laterality Date  . ADENOIDECTOMY    . APPENDECTOMY    . CARDIOVERSION  02/04/2012   Procedure: CARDIOVERSION;  Surgeon: JLaverda Page MD;  [place] [facility]  Service: Cardiovascular;  Laterality: N/A;  . CARDIOVERSION N/A 05/16/2015   Procedure: CARDIOVERSION;  Surgeon: JAdrian Prows MD;  [facility]  Service: Cardiovascular;  Laterality: N/A;  . CARDIOVERSION N/A 06/05/2017   Procedure: CARDIOVERSION;  Surgeon: PNigel Mormon MD;  [facility];  Service: Cardiovascular;  Laterality: N/A;  . CARPAL TUNNEL RELEASE Left 09/24/2017   Procedure: CARPAL TUNNEL RELEASE ENDOSCOPIC;  Surgeon:  PCorky Mull MD;  [place] [facility]  Service: Orthopedics;  Laterality: Left;  sleep apnea  . CARPAL TUNNEL RELEASE Right 10/29/2017   Procedure: ENDOSCOPIC CARPAL TUNNEL RELEASE WRIST;  Surgeon: PCorky Mull MD;  [place] [facility]  Service: Orthopedics;  Laterality: Right;  sleep apnea  . EYE SURGERY     LASIK EYE SURG X 2 LEFT EYE AND ONCE ON RT EYE  . HEMORROIDECTOMY    . HERNIA REPAIR     LEFT INGUINAL HERNIA;  2ND SURGERY TO DO REVISION LEFT INGUINAL HERNIA AND REPAIR RT INGUINAL HERNIA  . INGUINAL HERNIA REPAIR Left 12/08/2012   Procedure: open left inguinal  EXPLORATION;  Surgeon: MPedro Earls MD;  Location[facility];  Service: General;  Laterality: Left;  . SHOULDER ARTHROSCOPY WITH OPEN ROTATOR CUFF REPAIR Left 09/14/2018   Procedure: LEFT SHOULDER ARTHROSCOPY WITH MINIE  OPEN ROTATOR CUFF REPAIR, BICEPS TENODESIS, DISTAL CLAVICLE EXCISION, SUBACROMIAL DECOMPRESSION, SLEEP APNEA;  Surgeon: PLeim Fabry MD;  Location: [facility];  Service: Orthopedics;  Laterality: Left;  . TONSILLECTOMY     Social History   Socioeconomic History  . Marital status: Single    Spouse name: Not on file  . Number of children: 2  . Years of education: 140 . Highest education level: Not on file  Occupational History  . Occupation: RNurse, adultNeeds  . FEmergency planning/management officer  strain: Not on file  . Food insecurity    Worry: Not on file    Inability: Not on file  . Transportation needs    Medical: Not on file    Non-medical: Not on file  Tobacco Use  . Smoking status: Former Smoker    Packs/day: 1.00    Years: 5.00    Pack years: 5.00    Types: Cigarettes    Quit date: 1998    Years since quitting: 22.6  . Smokeless tobacco: Current User    Types: Snuff  . Tobacco comment: Uses dips occasionally.  Substance and Sexual Activity  . Alcohol use: Not Currently    Alcohol/week: 0.0 standard drinks    Comment: 3-4 beers per week/ not in a few months  . Drug  use: No  . Sexual activity: Not on file  Lifestyle  . Physical activity    Days per week: Not on file    Minutes per session: Not on file  . Stress: Not on file  Relationships  . Social Herbalist on phone: Not on file    Gets together: Not on file    Attends religious service: Not on file    Active member of club or organization: Not on file    Attends meetings of clubs or organizations: Not on file    Relationship status: Not on file  . Intimate partner violence    Fear of current or ex partner: Not on file    Emotionally abused: Not on file    Physically abused: Not on file    Forced sexual activity: Not on file  Other Topics Concern  . Not on file  Social History Narrative   Lives in [place] at home alone.   Right-handed.   2 sodas and 2 cups coffee per day.   Truck driver   ROS  Review of Systems  Constitution: Positive for malaise/fatigue (chronic). Negative for chills, decreased appetite and weight gain.  Cardiovascular: Positive for leg swelling. Negative for dyspnea on exertion and syncope.  Respiratory: Positive for snoring (has sleep apnea on CPAP).   Endocrine: Negative for cold intolerance.  Hematologic/Lymphatic: Does not bruise/bleed easily.  Musculoskeletal: Positive for back pain, joint pain (left shoulder pain) and muscle cramps. Negative for joint swelling.  Gastrointestinal: Negative for abdominal pain, anorexia, change in bowel habit, hematochezia and melena.  Neurological: Positive for paresthesias (bilateral legs). Negative for headaches and light-headedness.  Psychiatric/Behavioral: Negative for depression and substance abuse.  All other systems reviewed and are negative.  Objective  Blood pressure 131/86, pulse 70, height 6' 1"  (1.854 m), weight 273 lb 6.4 oz (124 kg), SpO2 98 %. Body mass index is 36.07 kg/m.   Physical Exam  Constitutional: He appears well-developed. No distress.  Moderately obese  HENT:  Head: Atraumatic.   Eyes: Conjunctivae are normal.  Neck: Neck supple. No JVD present. No thyromegaly present.  Cardiovascular: Normal rate and intact distal pulses. An irregular rhythm present. Exam reveals no gallop.  No murmur heard. S1 is variable, S2 is normal. 1+ pitting leg edema  Pulmonary/Chest: Effort normal and breath sounds normal.  Abdominal: Soft. Bowel sounds are normal.  Musculoskeletal: Normal range of motion.  Neurological: He is alert.  Skin: Skin is warm and dry.  Psychiatric: He has a normal mood and affect.   Radiology: No results found.  Laboratory examination:   CMP Latest Ref Rng & Units 09/10/2018 06/05/2017 06/04/2017  Glucose 65 - 99 mg/dL - 105(H)  111(H)  BUN 6 - 20 mg/dL - 13 16  Creatinine 0.61 - 1.24 mg/dL - 0.94 1.06  Sodium 135 - 145 mmol/L - 141 138  Potassium 3.5 - 5.1 mmol/L 3.9 4.1 3.6  Chloride 101 - 111 mmol/L - 105 105  CO2 22 - 32 mmol/L - 27 23  Calcium 8.9 - 10.3 mg/dL - 9.5 9.4  Total Protein 6.0 - 8.3 g/dL - - -  Total Bilirubin 0.3 - 1.2 mg/dL - - -  Alkaline Phos 39 - 117 U/L - - -  AST 0 - 37 U/L - - -  ALT 0 - 53 U/L - - -   CBC Latest Ref Rng & Units 06/04/2017 12/02/2012 09/30/2011  WBC 4.0 - 10.5 K/uL 5.7 4.6 7.3  Hemoglobin 13.0 - 17.0 g/dL 14.1 13.6 15.0  Hematocrit 39.0 - 52.0 % 42.3 38.8(L) 47.7  Platelets 150 - 400 K/uL 209 197 -   Lipid Panel     Component Value Date/Time   CHOL 163 06/17/2011 1000   TRIG 104 06/17/2011 1000   HDL 38 (L) 06/17/2011 1000   CHOLHDL 4.3 06/17/2011 1000   VLDL 21 06/17/2011 1000   LDLCALC 104 (H) 06/17/2011 1000   HEMOGLOBIN A1C No results found for: HGBA1C, MPG TSH No results for input(s): TSH in the last 8760 hours. Medications   Current Outpatient Medications  Medication Instructions  . acetaminophen (TYLENOL) 1,000 mg, Oral, Every 8 hours  . cholecalciferol (VITAMIN D3) 1,000 Units, Oral, Daily  . dofetilide (TIKOSYN) 500 mcg, Oral, 2 times daily  . DULoxetine (CYMBALTA) 20 mg, Oral, Daily   . furosemide (LASIX) 40 mg, Oral, Daily  . Magnesium Oxide -Mg Supplement 400 MG CAPS TAKE 1 CAPSULE BY MOUTH ONCE DAILY  . omeprazole (PRILOSEC) 20 mg, Oral, Daily  . potassium chloride (K-DUR) 10 MEQ tablet 10 mEq, Oral, 2 times daily  . rOPINIRole (REQUIP) 4 mg, Oral, 3 times daily PRN  . vitamin B-12 (CYANOCOBALAMIN) 1,000 mcg, Oral, Daily    Cardiac Studies:   Lexiscan myoview 5.31.12 Diaphragmatic attenuation withour ischemia. Normal LVEF.   Echocardiogram 05/22/2017: Left ventricle cavity is normal in size. Mild concentric hypertrophy of the left ventricle. Normal global wall motion. Unable to evaluate diastolic function due to A. Fibrillation. Calculated EF 55%. Left atrial cavity is mildly dilated. Inadequate tricuspid regurgitation jet to estimate pulmonary artery pressure. Normal right atrial pressure. Mildly dilated aortic root measuring 3.9 cm. No significant change compared to previous study dated 05/03/2015.  Assessment     ICD-10-CM   1. Paroxysmal atrial fibrillation (HCC)  I48.0 EKG 12-Lead   CHA2DS2-VASc Score is 0 with yearly risk of stroke of 0-1%.  2. Mixed hyperlipidemia  E78.2 Lipid Panel With LDL/HDL Ratio  3. Pre-diabetes  R73.03 Hgb A1c w/o eAG  4. Therapeutic drug monitoring  Z51.81 TSH    CMP14+EGFR    Magnesium  5. OSA on CPAP  G47.33    Z99.89     EKG 11/02/2018: Atrial fibrillation controlled ventricular response at the rate of 80 bpm, normal axis, no evidence of ischemia, normal QT interval.  EKG 04/22/2018: Normal sinus rhythm at rate of 74 bpm, normal axis. No evidence of ischemia, normal EKG. No significant change from EKG 01/07/2018  Recommendations:   Patient presents here for 6 month office visit and follow-up of lone atrial fibrillation, patient has lost about 25 pounds in weight with diet and feels well, essentially asymptomatic.  Today he was back in atrial fibrillation, I do not  know whether this is paroxysmal or persistent atrial  fibrillation.  Patient is not on anticoagulation due to low cardioembolic risk.  I discussed with him regarding rate control versus rhythm control, as he is asymptomatic frequent potentially consider rate control only, I'll bring him back in 3 months and repeat EKG and if he is been persistently to fibrillation, but discontinue Tikosyn.  He needs labs for therapeutic drug monitoring, I'll see him back in 3 months and discuss the labs as well.  I'm glad that he has lost weight.  He now has trace leg edema.   With regard to obstructive sleep apnea,  He has not seen any physicians since he got the CPAP years ago.  I will try to look into his records, he'll should be seeing a physician to manage his CPAP as he is a driver driving heavy motor vehicles.  Adrian Prows, MD, [facility] 11/02/2018, 10:19 AM [facility] [place] Pager: [phone] Office: [phone] If no answer Cell [phone]

## 2018-11-04 DIAGNOSIS — S46012D Strain of muscle(s) and tendon(s) of the rotator cuff of left shoulder, subsequent encounter: Secondary | ICD-10-CM | POA: Diagnosis not present

## 2018-11-05 DIAGNOSIS — R7303 Prediabetes: Secondary | ICD-10-CM | POA: Diagnosis not present

## 2018-11-05 DIAGNOSIS — E782 Mixed hyperlipidemia: Secondary | ICD-10-CM | POA: Diagnosis not present

## 2018-11-05 DIAGNOSIS — Z5181 Encounter for therapeutic drug level monitoring: Secondary | ICD-10-CM | POA: Diagnosis not present

## 2018-11-06 LAB — CMP14+EGFR
ALT: 14 IU/L (ref 0–44)
AST: 18 IU/L (ref 0–40)
Albumin/Globulin Ratio: 1.5 (ref 1.2–2.2)
Albumin: 4.1 g/dL (ref 3.8–4.8)
Alkaline Phosphatase: 123 IU/L — ABNORMAL HIGH (ref 39–117)
BUN/Creatinine Ratio: 14 (ref 10–24)
BUN: 13 mg/dL (ref 8–27)
Bilirubin Total: 0.5 mg/dL (ref 0.0–1.2)
CO2: 23 mmol/L (ref 20–29)
Calcium: 9.6 mg/dL (ref 8.6–10.2)
Chloride: 102 mmol/L (ref 96–106)
Creatinine, Ser: 0.94 mg/dL (ref 0.76–1.27)
GFR calc Af Amer: 101 mL/min/{1.73_m2} (ref >59)
GFR calc non Af Amer: 87 mL/min/{1.73_m2} (ref >59)
Globulin, Total: 2.8 g/dL (ref 1.5–4.5)
Glucose: 94 mg/dL (ref 65–99)
Potassium: 4.8 mmol/L (ref 3.5–5.2)
Sodium: 140 mmol/L (ref 134–144)
Total Protein: 6.9 g/dL (ref 6.0–8.5)

## 2018-11-06 LAB — LIPID PANEL WITH LDL/HDL RATIO
Cholesterol, Total: 126 mg/dL (ref 100–199)
HDL: 34 mg/dL — ABNORMAL LOW (ref >39)
LDL Calculated: 72 mg/dL (ref 0–99)
LDl/HDL Ratio: 2.1 ratio (ref 0.0–3.6)
Triglycerides: 98 mg/dL (ref 0–149)
VLDL Cholesterol Cal: 20 mg/dL (ref 5–40)

## 2018-11-06 LAB — MAGNESIUM: Magnesium: 2.2 mg/dL (ref 1.6–2.3)

## 2018-11-06 LAB — TSH: TSH: 1.95 u[IU]/mL (ref 0.450–4.500)

## 2018-11-06 LAB — HGB A1C W/O EAG: Hgb A1c MFr Bld: 5.5 % (ref 4.8–5.6)

## 2018-11-06 NOTE — Progress Notes (Signed)
Cholesterol is well controlled. HgbA1c is well controlled. TSH within normal limits. Kidney function, electrolytes, and liver enzymes are within normal limits.

## 2018-11-06 NOTE — Progress Notes (Signed)
LMOM with details

## 2018-11-07 ENCOUNTER — Other Ambulatory Visit: Payer: Self-pay | Admitting: Cardiology

## 2018-11-07 DIAGNOSIS — I4819 Other persistent atrial fibrillation: Secondary | ICD-10-CM

## 2018-12-03 ENCOUNTER — Other Ambulatory Visit: Payer: Self-pay | Admitting: Cardiology

## 2018-12-03 DIAGNOSIS — I4819 Other persistent atrial fibrillation: Secondary | ICD-10-CM

## 2018-12-09 ENCOUNTER — Other Ambulatory Visit: Payer: Self-pay

## 2018-12-09 NOTE — Telephone Encounter (Signed)
Can this be sent? 

## 2018-12-10 MED ORDER — DOFETILIDE 500 MCG PO CAPS: 500.0000 ug | ORAL_CAPSULE | Freq: Two times a day (BID) | ORAL | 3 refills | Status: DC

## 2019-01-03 ENCOUNTER — Other Ambulatory Visit: Payer: Self-pay | Admitting: Cardiology

## 2019-01-03 DIAGNOSIS — I4819 Other persistent atrial fibrillation: Secondary | ICD-10-CM

## 2019-01-16 ENCOUNTER — Other Ambulatory Visit: Payer: Self-pay | Admitting: Cardiology

## 2019-01-16 DIAGNOSIS — I4819 Other persistent atrial fibrillation: Secondary | ICD-10-CM

## 2019-01-18 NOTE — Telephone Encounter (Signed)
No. This is for restless leg. I have not ordered this. I am not sure who did. Please ask patient

## 2019-01-18 NOTE — Telephone Encounter (Signed)
Can we send this?

## 2019-01-19 ENCOUNTER — Other Ambulatory Visit: Payer: Self-pay | Admitting: Cardiology

## 2019-02-04 ENCOUNTER — Ambulatory Visit: Payer: BC Managed Care – PPO | Admitting: Cardiology

## 2019-02-12 ENCOUNTER — Other Ambulatory Visit: Payer: Self-pay | Admitting: Cardiology

## 2019-02-12 DIAGNOSIS — I4819 Other persistent atrial fibrillation: Secondary | ICD-10-CM

## 2019-03-09 ENCOUNTER — Other Ambulatory Visit: Payer: Self-pay | Admitting: Cardiology

## 2019-03-21 ENCOUNTER — Other Ambulatory Visit: Payer: Self-pay | Admitting: Cardiology

## 2019-03-21 DIAGNOSIS — I4819 Other persistent atrial fibrillation: Secondary | ICD-10-CM

## 2019-04-25 ENCOUNTER — Other Ambulatory Visit: Payer: Self-pay | Admitting: Cardiology

## 2019-04-25 DIAGNOSIS — I4819 Other persistent atrial fibrillation: Secondary | ICD-10-CM

## 2019-05-06 ENCOUNTER — Other Ambulatory Visit: Payer: Self-pay | Admitting: Cardiology

## 2019-05-21 ENCOUNTER — Other Ambulatory Visit: Payer: Self-pay | Admitting: Cardiology

## 2019-05-21 DIAGNOSIS — I4819 Other persistent atrial fibrillation: Secondary | ICD-10-CM

## 2019-05-21 IMAGING — DX DG CHEST 2V
2 series · 2 of 2 positions shown · non-contrast
Comparison: 07/08/2014

CLINICAL DATA: Chronic Productive cough x 1 year worse x 1 week,
SOB with pain on deep breath, A-fib

EXAM:
CHEST  2 VIEW

[dg chest 2 view (1 of 2)]
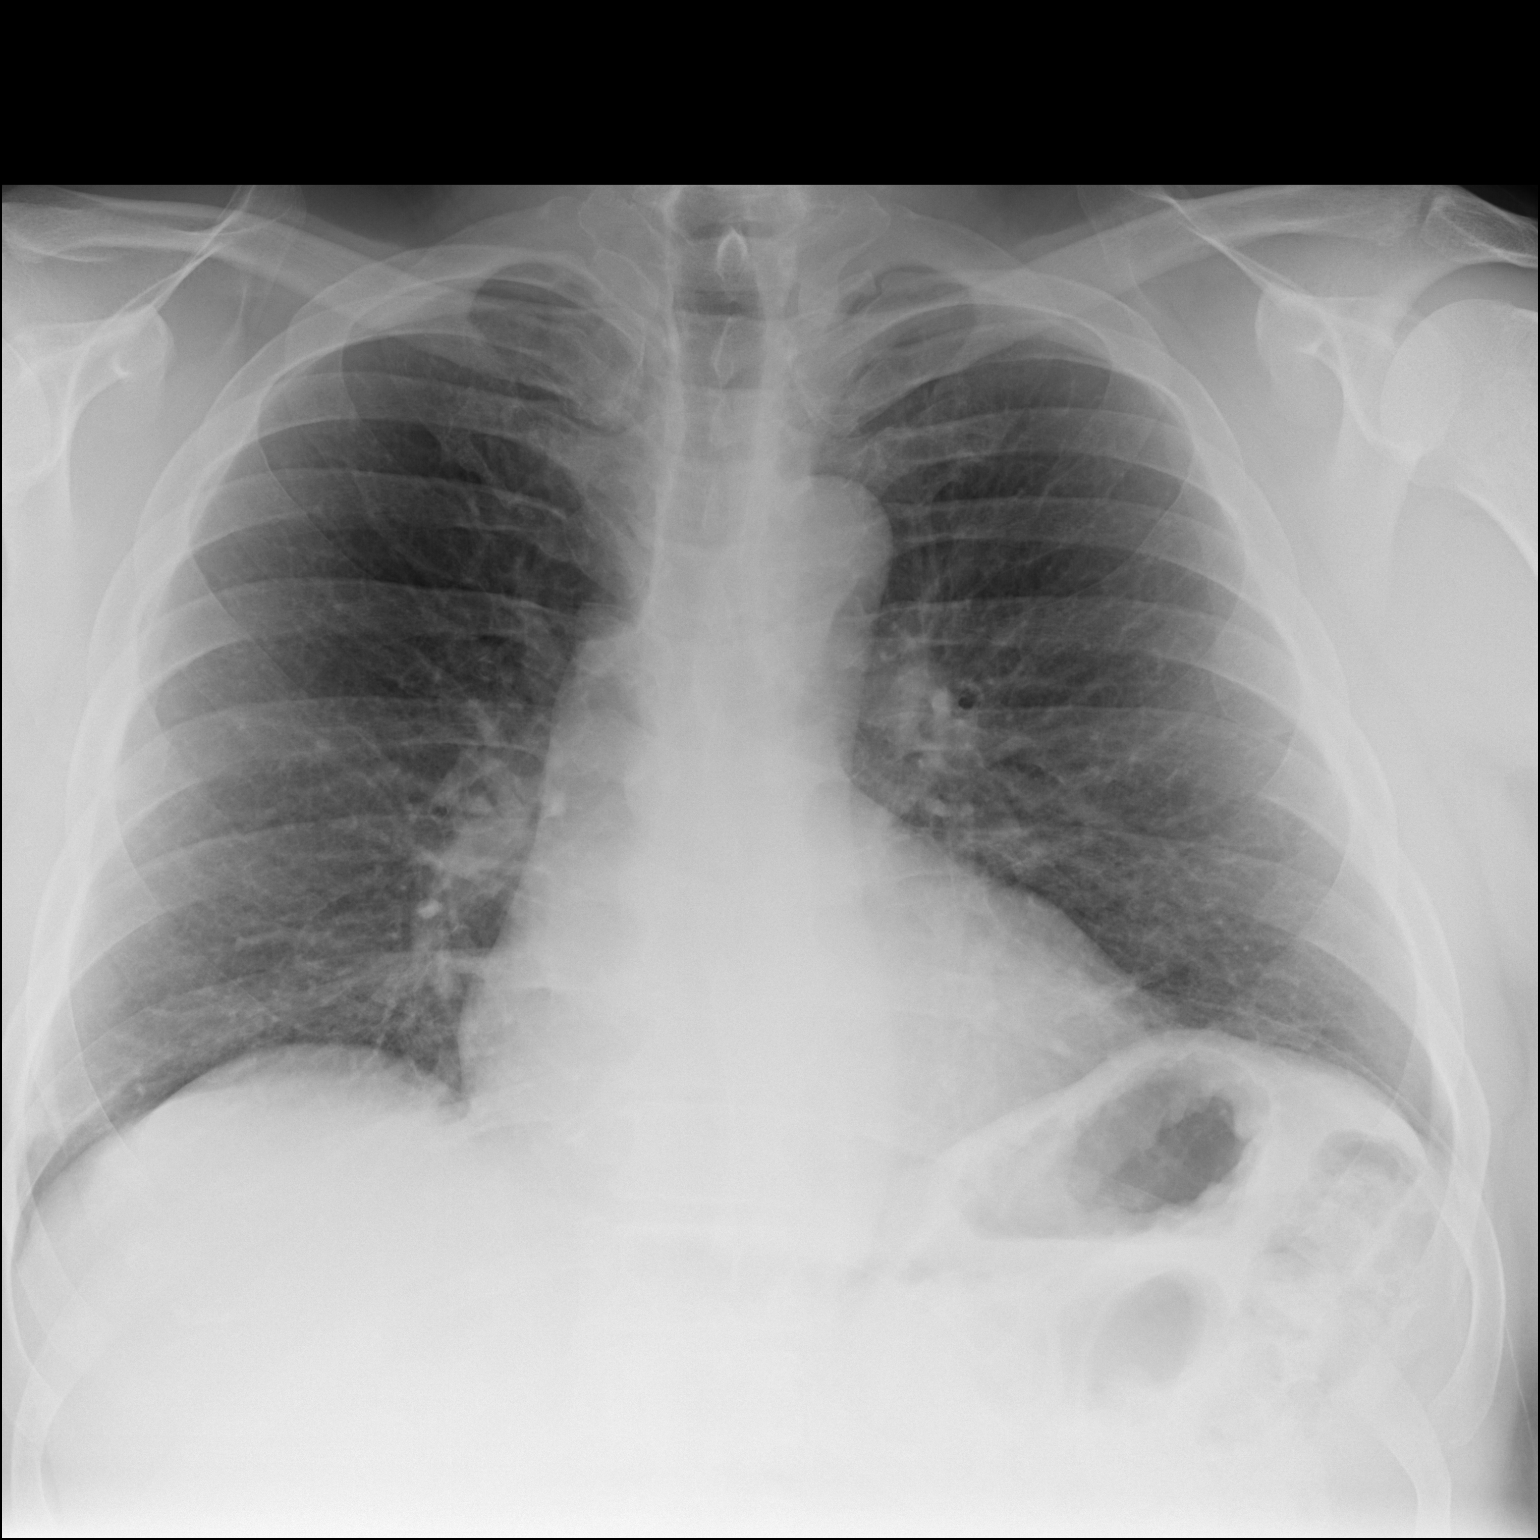

[dg chest 2 view (2 of 2)]
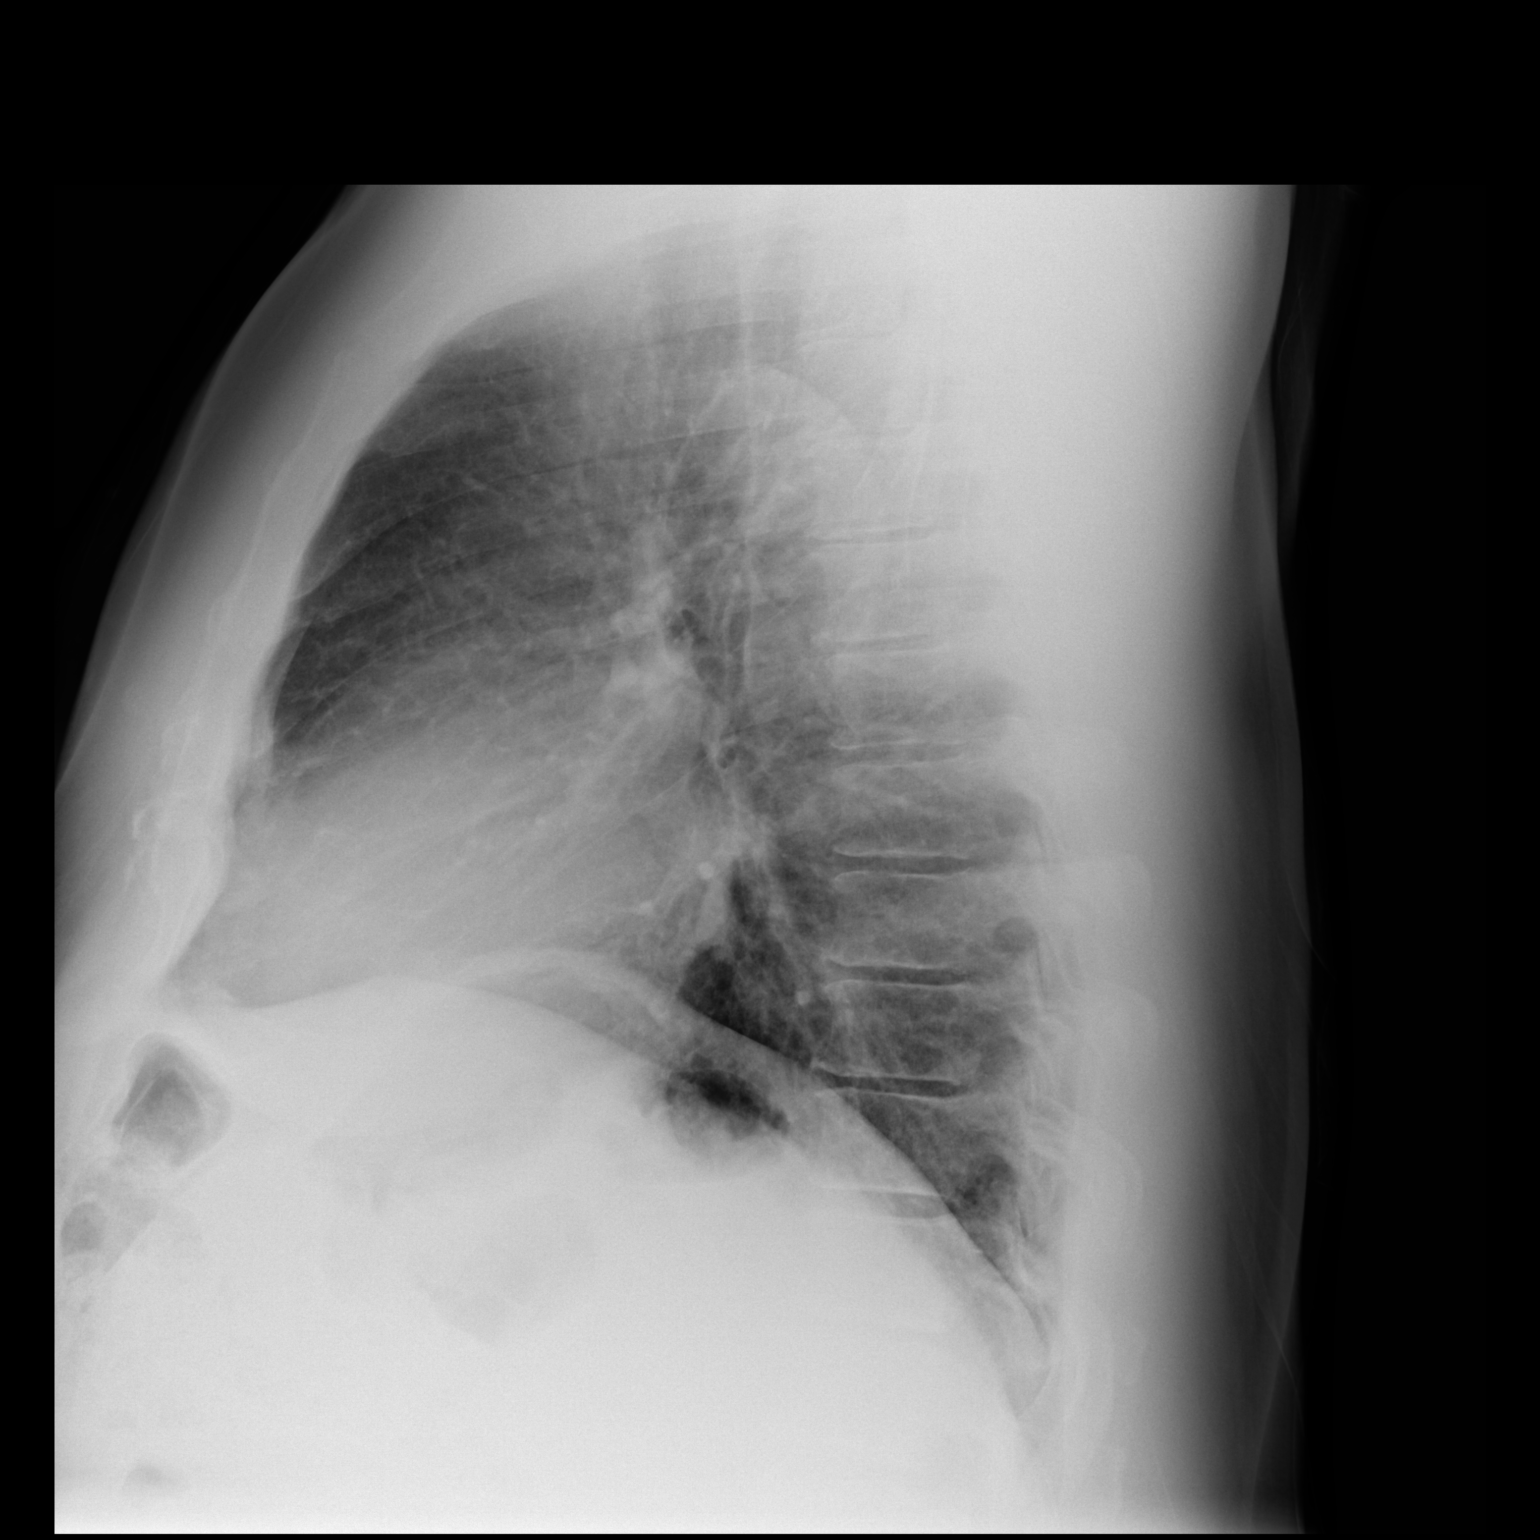

[2 of 2 positions shown; findings below may reference images not displayed]

FINDINGS: The heart size and mediastinal contours are within normal limits.
Both lungs are clear. The visualized skeletal structures are
unremarkable.
IMPRESSION: No active cardiopulmonary disease.

## 2019-05-21 MED ORDER — ROPINIROLE HCL 4 MG PO TABS: 4.0000 mg | ORAL_TABLET | Freq: Three times a day (TID) | ORAL | 0 refills | Status: DC | PRN

## 2019-06-12 ENCOUNTER — Other Ambulatory Visit: Payer: Self-pay | Admitting: Cardiology

## 2019-06-12 DIAGNOSIS — I4819 Other persistent atrial fibrillation: Secondary | ICD-10-CM

## 2019-06-28 IMAGING — MR MR HEAD WO/W CM
20 series · 48 of 48 positions shown · IV contrast (10ML MULTIHANCE)
Comparison: None.

CLINICAL DATA: Acromegaly.

EXAM:
MRI HEAD WITHOUT AND WITH CONTRAST
TECHNIQUE: Multiplanar, multiecho pulse sequences of the brain and surrounding
structures were obtained without and with intravenous contrast.
CONTRAST:  10mL MULTIHANCE GADOBENATE DIMEGLUMINE 529 MG/ML IV SOLN

[Series 2: T1 · sagittal · 5.0mm · 0.49mm/px · 1 of 25 slices shown (1 of 5)]
[im 1/25]
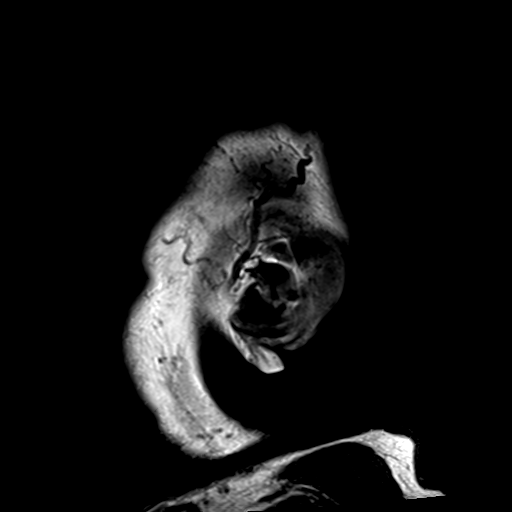

[Series 4: DWI · axial · 3.0mm · 1.20mm/px · z∈[-103,+74]mm · 4 of 61 slices shown (1 of 2)]
[im 1/61]
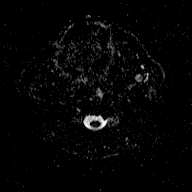
[im 21/61]
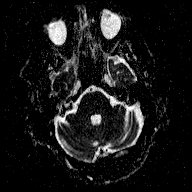
[im 41/61]
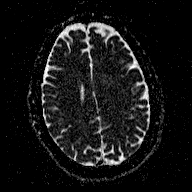
[im 61/61]
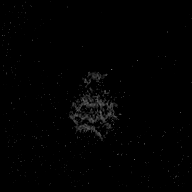

[Series 5: T2 · axial · 5.0mm · 0.90mm/px · z∈[-101,+76]mm · 3 of 31 slices shown (1 of 2)]
[im 1/31]
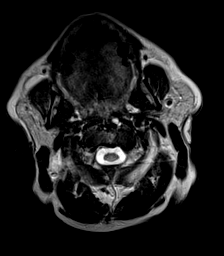
[im 16/31]
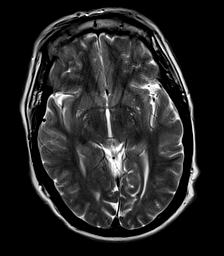
[im 31/31]
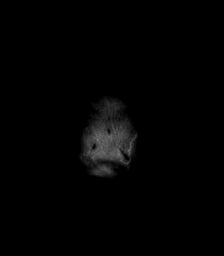

[Series 6: FLAIR · axial · 3.0mm · 0.45mm/px · z∈[-101,+76]mm · 4 of 51 slices shown]
[im 1/51]
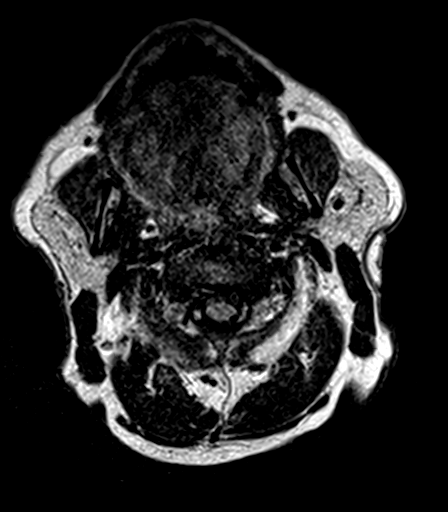
[im 17/51]
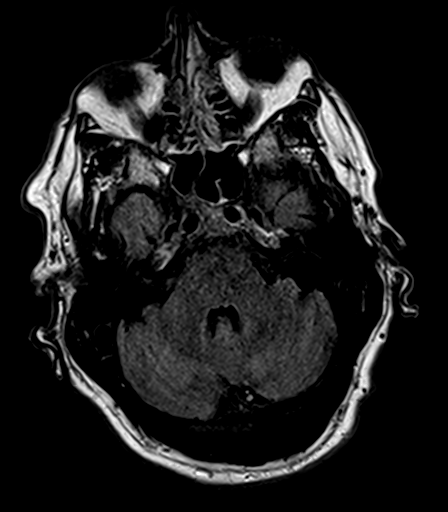
[im 34/51]
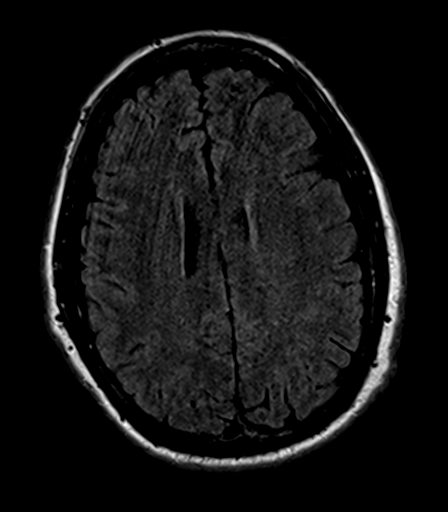
[im 51/51]
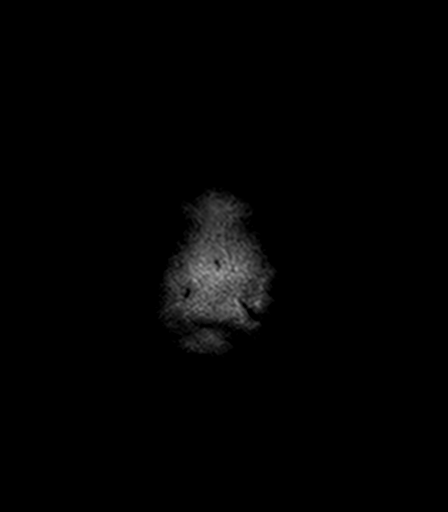

[Series 7: T2 · axial · 5.0mm · 0.72mm/px · z∈[-101,+76]mm · 3 of 31 slices shown (2 of 2)]
[im 1/31]
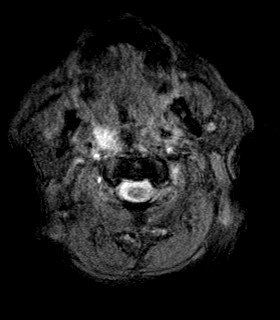
[im 16/31]
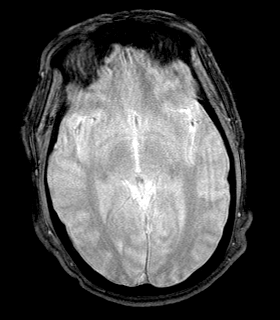
[im 31/31]
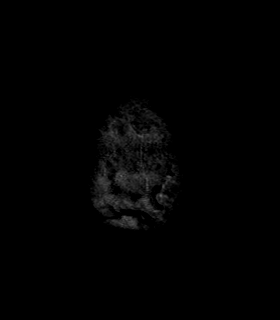

[Series 8: T1 · sagittal · 3.0mm · 0.53mm/px · 1 of 15 slices shown (2 of 5)]
[im 1/15]
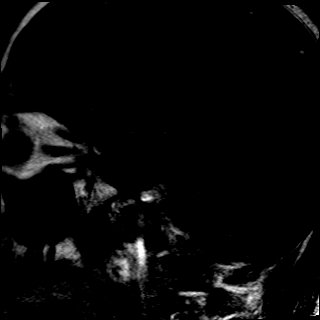

[Series 9: T1 · coronal · 3.0mm · 0.53mm/px · 1 of 15 slices shown (3 of 5)]
[im 1/15]
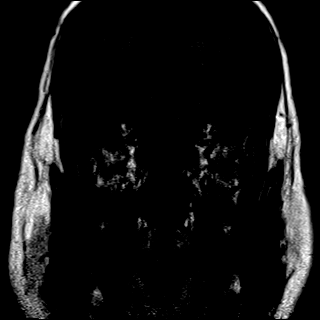

[Series 10: T1 · coronal · non-contrast · 3.0mm · 0.42mm/px · 1 of 9 slices shown (4 of 5)]
[im 1/9]
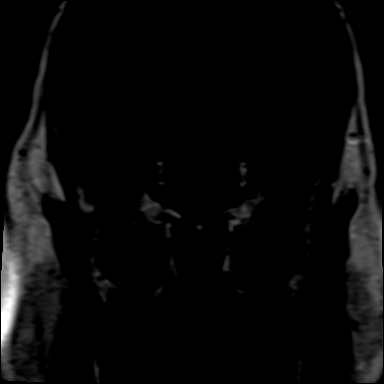

[Series 11: T1 post-contrast · coronal · 3.0mm · 0.62mm/px · 1 of 9 slices shown (1 of 10)]
[im 1/9]
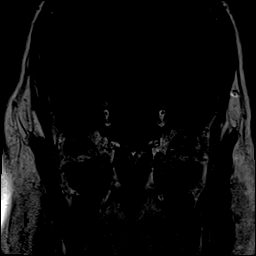

[Series 12: T1 post-contrast · coronal · 3.0mm · 0.62mm/px · 1 of 9 slices shown (2 of 10)]
[im 1/9]
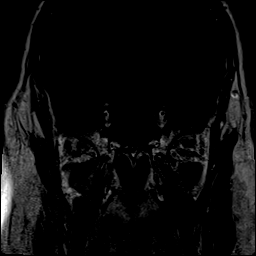

[Series 13: T1 post-contrast · coronal · 3.0mm · 0.62mm/px · 1 of 9 slices shown (3 of 10)]
[im 1/9]
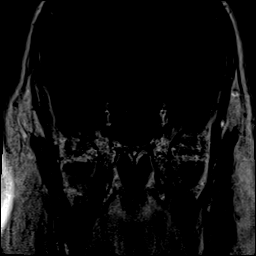

[Series 14: T1 post-contrast · coronal · 3.0mm · 0.62mm/px · 1 of 9 slices shown (4 of 10)]
[im 1/9]
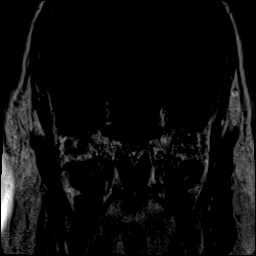

[Series 15: T1 post-contrast · coronal · 3.0mm · 0.62mm/px · 1 of 9 slices shown (5 of 10)]
[im 1/9]
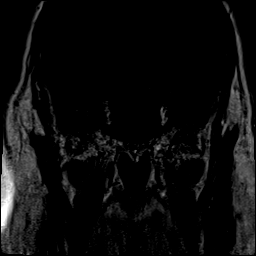

[Series 16: T1 post-contrast · coronal · 3.0mm · 0.62mm/px · 1 of 9 slices shown (6 of 10)]
[im 1/9]
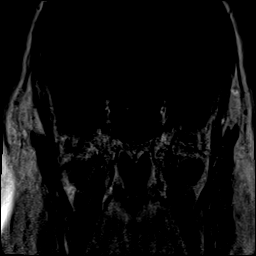

[Series 17: T1 post-contrast · sagittal · 3.0mm · 0.53mm/px · 1 of 15 slices shown (7 of 10)]
[im 1/15]
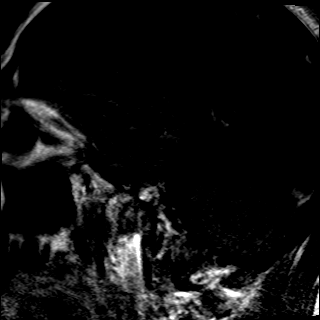

[Series 18: T1 post-contrast · coronal · 3.0mm · 0.44mm/px · 1 of 15 slices shown (8 of 10)]
[im 1/15]
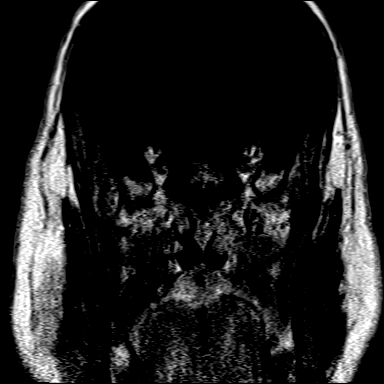

[Series 19: T1 post-contrast · sagittal · 3.0mm · 0.53mm/px · 1 of 15 slices shown (9 of 10)]
[im 1/15]
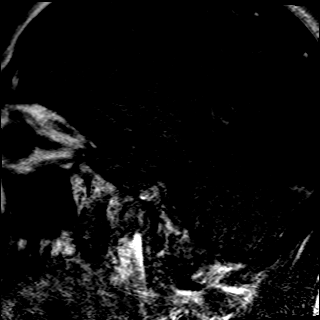

[Series 20: T1 · axial · 1.0mm · 1.00mm/px · z∈[-84,+71]mm · 13 of 160 slices shown (5 of 5)]
[im 1/160]
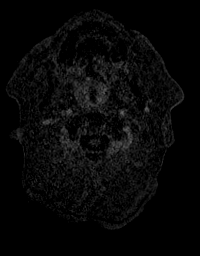
[im 14/160]
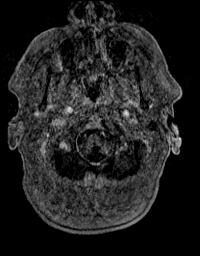
[im 27/160]
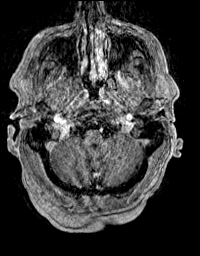
[im 40/160]
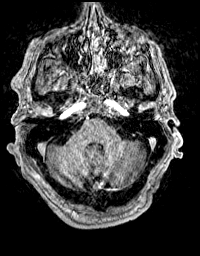
[im 54/160]
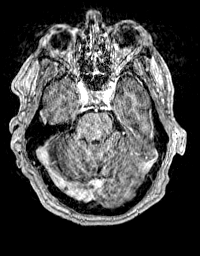
[im 67/160]
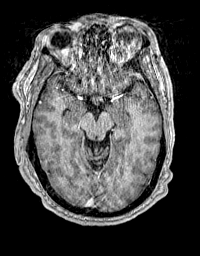
[im 80/160]
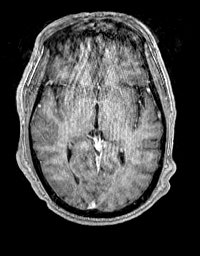
[im 93/160]
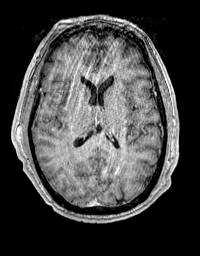
[im 107/160]
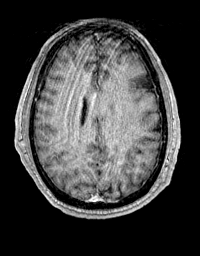
[im 120/160]
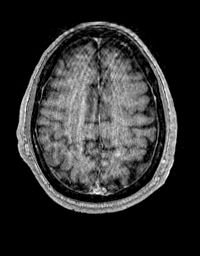
[im 133/160]
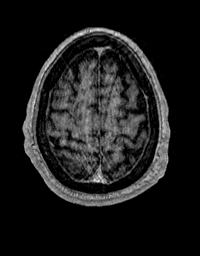
[im 146/160]
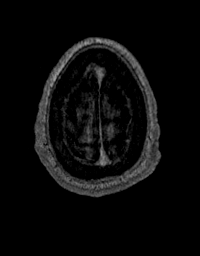
[im 160/160]
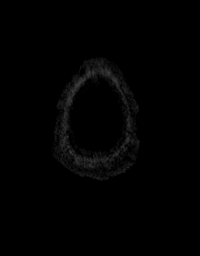

[Series 21: T1 post-contrast · coronal · 5.0mm · 0.45mm/px · 3 of 35 slices shown (10 of 10)]
[im 1/35]
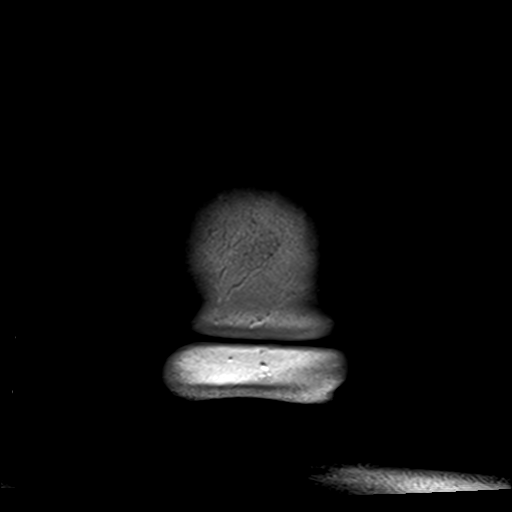
[im 18/35]
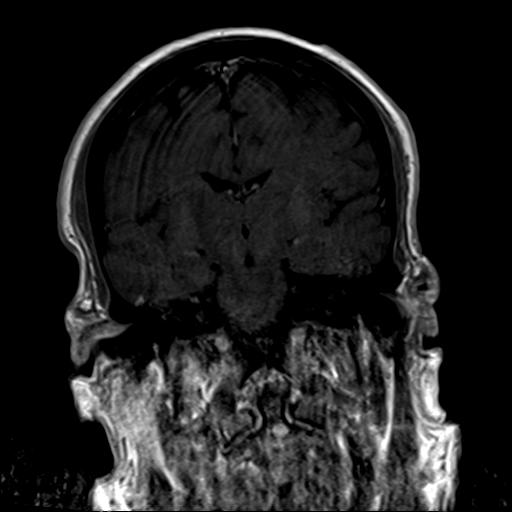
[im 35/35]
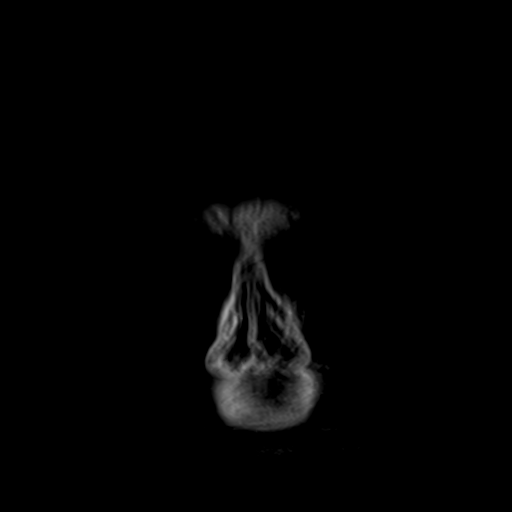

[Series 100: DWI · axial · 3.0mm · 1.20mm/px · z∈[-103,+74]mm · 5 of 61 slices shown (2 of 2)]
[im 1/61]
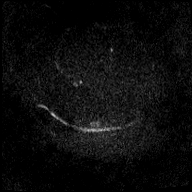
[im 16/61]
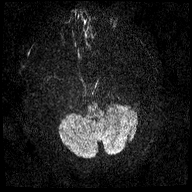
[im 31/61]
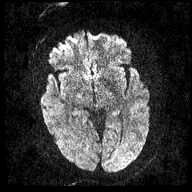
[im 46/61]
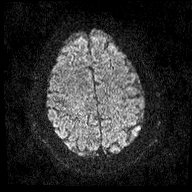
[im 61/61]
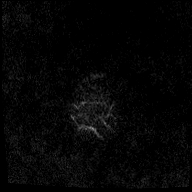

[48 of 48 positions shown; findings below may reference images not displayed]

FINDINGS: There is motion artifact throughout the study, with multiple
sequences (including dedicated pituitary sequences) being moderately
degraded despite repeated imaging attempts.

Brain: There is no evidence of acute infarct, intracranial
hemorrhage, mass, midline shift, or extra-axial fluid collection.
The ventricles and sulci are normal. No significant cerebral white
matter disease is seen. No abnormal enhancement is identified.

Dedicated pituitary imaging was performed. The pituitary gland is
small and located along the floor of the sella without significant
sellar expansion. Within limitations of motion artifact, no focal
pituitary lesion is identified. The infundibulum is midline. The
optic chiasm and cavernous sinuses are unremarkable.

Vascular: Major intracranial vascular flow voids are preserved.

Skull and upper cervical spine: Unremarkable bone marrow signal.

Sinuses/Orbits: Unremarkable orbits. Minimal scattered paranasal
sinus mucosal thickening. Asymmetric left-sided nasal turbinate
hypertrophy. Clear mastoid air cells.

Other: None.
IMPRESSION: 1. Mildly partially empty sella configuration without focal
pituitary lesion identified within limitations of motion artifact.
2. Unremarkable appearance of the brain for age.

## 2019-07-13 ENCOUNTER — Other Ambulatory Visit: Payer: Self-pay | Admitting: Cardiology

## 2019-07-13 DIAGNOSIS — I4819 Other persistent atrial fibrillation: Secondary | ICD-10-CM

## 2019-08-03 ENCOUNTER — Telehealth: Payer: Self-pay

## 2019-08-05 NOTE — Telephone Encounter (Signed)
Error

## 2019-08-13 ENCOUNTER — Other Ambulatory Visit: Payer: Self-pay | Admitting: Cardiology

## 2019-08-16 ENCOUNTER — Other Ambulatory Visit: Payer: Self-pay | Admitting: Cardiology

## 2019-08-16 DIAGNOSIS — I480 Paroxysmal atrial fibrillation: Secondary | ICD-10-CM

## 2019-08-17 ENCOUNTER — Ambulatory Visit: Payer: BC Managed Care – PPO | Admitting: Cardiology

## 2019-08-19 ENCOUNTER — Other Ambulatory Visit: Payer: Self-pay | Admitting: Cardiology

## 2019-08-19 DIAGNOSIS — I4819 Other persistent atrial fibrillation: Secondary | ICD-10-CM

## 2019-09-09 ENCOUNTER — Ambulatory Visit: Payer: BC Managed Care – PPO | Admitting: Cardiology

## 2019-09-09 ENCOUNTER — Other Ambulatory Visit: Payer: Self-pay

## 2019-09-09 ENCOUNTER — Encounter: Payer: Self-pay | Admitting: Cardiology

## 2019-09-09 VITALS — BP 126/75 | HR 67 | Resp 16 | Ht 73.0 in | Wt 307.0 lb

## 2019-09-09 DIAGNOSIS — R7303 Prediabetes: Secondary | ICD-10-CM

## 2019-09-09 DIAGNOSIS — I872 Venous insufficiency (chronic) (peripheral): Secondary | ICD-10-CM

## 2019-09-09 DIAGNOSIS — I4819 Other persistent atrial fibrillation: Secondary | ICD-10-CM

## 2019-09-09 DIAGNOSIS — I878 Other specified disorders of veins: Secondary | ICD-10-CM

## 2019-09-09 DIAGNOSIS — R600 Localized edema: Secondary | ICD-10-CM

## 2019-09-09 DIAGNOSIS — E782 Mixed hyperlipidemia: Secondary | ICD-10-CM

## 2019-09-09 NOTE — Progress Notes (Signed)
Primary Physician/Referring:  Patient, No Pcp Per  Patient ID: Craig Morgan, male    DOB: November 04, 1955, 64 y.o.   MRN: [id number]  Chief Complaint  Patient presents with  . Atrial Fibrillation  . Follow-up    3 month  . Results    lab   HPI:    HPI: Craig Morgan  is a 64 y.o. Caucasian male on tikosyn for symptomatic Afib, leg edmea, here for follow up.  On his last office visit due to persistent atrial fibrillation, and him being asymptomatic, Tikosyn was discontinued.   He is presently doing well with regard to atrial fibrillation without palpitations of fatigue, he does have chronic fatigue and has been using CPAP but not as regularly.  He also has chronic leg edema related to venous insufficiency and obesity and diet.  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  Past Medical History:  Diagnosis Date  . Atrial flutter (HCC) 2012  . Back pain   . DDD (degenerative disc disease), lumbosacral    Back  . Dysrhythmia    history of A FIb  . Family history of adverse reaction to anesthesia    Mother - PONV  . GERD (gastroesophageal reflux disease)   . Numbness    RIGHT LEG - KNEE TO ANKLE - STATES HE WAS GIVEN INJECTION ONCE FOR SCIATIC PROBLEM AND THE NUMBNESS BEGAN AFTER THE INJECTION.  Marland Kitchen Persistent atrial fibrillation (HCC)   . Sciatica   . Sleep apnea    uses cpap   Past Surgical History:  Procedure Laterality Date  . ADENOIDECTOMY    . APPENDECTOMY    . CARDIOVERSION  02/04/2012   Procedure: CARDIOVERSION;  Surgeon: Pamella Pert, MD;  Location: [facility] ENDOSCOPY;  Service: Cardiovascular;  Laterality: N/A;  . CARDIOVERSION N/A 05/16/2015   Procedure: CARDIOVERSION;  Surgeon: Craig Decamp, MD;  Location: [facility] ENDOSCOPY;  Service: Cardiovascular;  Laterality: N/A;  . CARDIOVERSION N/A 06/05/2017   Procedure: CARDIOVERSION;  Surgeon: Elder Negus, MD;  Location: [facility] ENDOSCOPY;  Service: Cardiovascular;  Laterality: N/A;    . CARPAL TUNNEL RELEASE Left 09/24/2017   Procedure: CARPAL TUNNEL RELEASE ENDOSCOPIC;  Surgeon: Christena Flake, MD;  Location: [facility]ERY [facility];  Service: Orthopedics;  Laterality: Left;  sleep apnea  . CARPAL TUNNEL RELEASE Right 10/29/2017   Procedure: ENDOSCOPIC CARPAL TUNNEL RELEASE WRIST;  Surgeon: Christena Flake, MD;  Location: [facility] SUR[facility]ERY [facility];  Service: Orthopedics;  Laterality: Right;  sleep apnea  . EYE SURGERY     LASIK EYE SURG X 2 LEFT EYE AND ONCE ON RT EYE  . HEMORROIDECTOMY    . HERNIA REPAIR     LEFT INGUINAL HERNIA;  2ND SURGERY TO DO REVISION LEFT INGUINAL HERNIA AND REPAIR RT INGUINAL HERNIA  . INGUINAL HERNIA REPAIR Left 12/08/2012   Procedure: open left inguinal  EXPLORATION;  Surgeon: Valarie Merino, MD;  Location: [facility] ORS;  Service: General;  Laterality: Left;  . SHOULDER ARTHROSCOPY WITH OPEN ROTATOR CUFF REPAIR Left 09/14/2018   Procedure: LEFT SHOULDER ARTHROSCOPY WITH MINIE  OPEN ROTATOR CUFF REPAIR, BICEPS TENODESIS, DISTAL CLAVICLE EXCISION, SUBACROMIAL DECOMPRESSION, SLEEP APNEA;  Surgeon: Signa Kell, MD;  Location: [facility] ORS;  Service: Orthopedics;  Laterality: Left;  . TONSILLECTOMY      Social History   Tobacco Use  . Smoking status: Former Smoker    Packs/day: 1.00    Years: 5.00    Pack years: 5.00  Types: Cigarettes    Quit date: 1998    Years since quitting: 23.4  . Smokeless tobacco: Current User    Types: Snuff  . Tobacco comment: Uses dips occasionally.  Substance Use Topics  . Alcohol use: Yes    Alcohol/week: 0.0 standard drinks    Comment: rare    Marital Status: Single   ROS  Review of Systems  Constitutional: Positive for malaise/fatigue (chronic).  Cardiovascular: Positive for leg swelling. Negative for dyspnea on exertion and syncope.  Respiratory: Positive for snoring (has sleep apnea on CPAP).   Musculoskeletal: Positive for back pain, joint pain (left shoulder pain) and muscle cramps.  Gastrointestinal: Negative for  hematochezia and melena.  Neurological: Positive for paresthesias (bilateral legs).  All other systems reviewed and are negative.  Objective  Blood pressure 126/75, pulse 67, resp. rate 16, height 6\' 1"  (1.854 m), weight (!) 307 lb (139.3 kg), SpO2 97 %. Body mass index is 40.5 kg/m.   Physical Exam Constitutional:      Appearance: He is well-developed.     Comments: Moderately obese  Neck:     Thyroid: No thyromegaly.     Vascular: No JVD.  Cardiovascular:     Rate and Rhythm: Normal rate. Rhythm irregular.     Pulses: Intact distal pulses.          Carotid pulses are 2+ on the right side and 2+ on the left side.      Popliteal pulses are 2+ on the right side and 1+ on the left side.       Dorsalis pedis pulses are 0 on the right side and 2+ on the left side.       Posterior tibial pulses are 2+ on the right side and 0 on the left side.     Heart sounds: No murmur heard.  No gallop.      Comments: S1 is variable, S2 is normal. 2-3+ pitting leg edema below knee. Pulmonary:     Effort: Pulmonary effort is normal.     Breath sounds: Normal breath sounds.  Abdominal:     General: Bowel sounds are normal.     Palpations: Abdomen is soft.    Radiology: No results found.  Laboratory examination:   CMP Latest Ref Rng & Units 11/05/2018 09/10/2018 06/05/2017  Glucose 65 - 99 mg/dL 94 - 105(H)  BUN 8 - 27 mg/dL 13 - 13  Creatinine 0.76 - 1.27 mg/dL 0.94 - 0.94  Sodium 134 - 144 mmol/L 140 - 141  Potassium 3.5 - 5.2 mmol/L 4.8 3.9 4.1  Chloride 96 - 106 mmol/L 102 - 105  CO2 20 - 29 mmol/L 23 - 27  Calcium 8.6 - 10.2 mg/dL 9.6 - 9.5  Total Protein 6.0 - 8.5 g/dL 6.9 - -  Total Bilirubin 0.0 - 1.2 mg/dL 0.5 - -  Alkaline Phos 39 - 117 IU/L 123(H) - -  AST 0 - 40 IU/L 18 - -  ALT 0 - 44 IU/L 14 - -   CBC Latest Ref Rng & Units 06/04/2017 12/02/2012 09/30/2011  WBC 4.0 - 10.5 K/uL 5.7 4.6 7.3  Hemoglobin 13.0 - 17.0 g/dL 14.1 13.6 15.0  Hematocrit 39 - 52 % 42.3 38.8(L) 47.7    Platelets 150 - 400 K/uL 209 197 -   Lipid Panel     Component Value Date/Time   CHOL 126 11/05/2018 0934   TRIG 98 11/05/2018 0934   HDL 34 (L) 11/05/2018 0934   CHOLHDL 4.3 06/17/2011  1000   VLDL 21 06/17/2011 1000   LDLCALC 72 11/05/2018 0934   HEMOGLOBIN A1C Lab Results  Component Value Date   HGBA1C 5.5 11/05/2018   TSH Recent Labs    11/05/18 0933  TSH 1.950   Medications   Current Outpatient Medications  Medication Instructions  . acetaminophen (TYLENOL) 1,000 mg, Oral, Every 8 hours  . cholecalciferol (VITAMIN D3) 1,000 Units, Oral, Daily  . furosemide (LASIX) 40 mg, Oral, Daily PRN  . Magnesium Oxide -Mg Supplement 400 MG CAPS TAKE 1 CAPSULE BY MOUTH EVERY DAY  . omeprazole (PRILOSEC) 20 MG capsule TAKE 1 CAPSULE BY MOUTH EVERY DAY  . potassium chloride (K-DUR) 10 MEQ tablet 10 mEq, Oral, 2 times daily  . rOPINIRole (REQUIP) 4 mg, Oral, 3 times daily PRN  . vitamin B-12 (CYANOCOBALAMIN) 1,000 mcg, Oral, Daily    Cardiac Studies:   Lexiscan myoview 5.31.12 Diaphragmatic attenuation withour ischemia. Normal LVEF.   Echocardiogram 05/22/2017: Left ventricle cavity is normal in size. Mild concentric hypertrophy of the left ventricle. Normal global wall motion. Unable to evaluate diastolic function due to A. Fibrillation. Calculated EF 55%. Left atrial cavity is mildly dilated. Inadequate tricuspid regurgitation jet to estimate pulmonary artery pressure. Normal right atrial pressure. Mildly dilated aortic root measuring 3.9 cm. No significant change compared to previous study dated 05/03/2015.  Lower Extremity Venous Duplex  05/23/2018: Right: Abnormal reflux times were noted in the common femoral vein, great  saphenous vein at the saphenofemoral junction, great saphenous vein at the  prox calf, and great saphenous vein at the mid calf. There is no evidence  of deep vein thrombosis in the  lower extremity within the CFV, FV, and POPV. There is no evidence  of  superficial venous thrombosis within the visualized superficial veins.  Left: Abnormal reflux times were noted in the common femoral vein, great  saphenous vein at the saphenofemoral junction, great saphenous vein at the  proximal thigh, origin of the small saphenous vein, prox small saphenous  vein, and mid small saphenous  vein. There is no evidence of deep vein thrombosis in the lower extremity  within the CFV, FV, and POPV. There is no evidence of superficial venous  thrombosis within the visualized superficial veins.   EKG 09/09/2019: Atrial fibrillation with controlled ventricular sponsor at rate of 52 bpm, normal axis, no evidence of ischemia.  Normal QT interval.  Compared to 11/02/2018, previously heart rate was 80 bpm.  Otherwise no significant change.  EKG 04/22/2018: Normal sinus rhythm at rate of 74 bpm, normal axis. No evidence of ischemia, normal EKG. No significant change from EKG 01/07/2018  Assessment     ICD-10-CM   1. Persistent atrial fibrillation (HCC)  I48.19 EKG 12-Lead    PCV ECHOCARDIOGRAM COMPLETE  2. Venous intermittent claudication  I87.8 Ambulatory referral to Interventional Radiology  3. Venous insufficiency of both lower extremities  I87.2 Ambulatory referral to Interventional Radiology  4. Bilateral leg edema  R60.0 Ambulatory referral to Interventional Radiology    PCV ECHOCARDIOGRAM COMPLETE  5. Mixed hyperlipidemia  E78.2   6. Pre-diabetes  R73.03    CHA2DS2-VASc Score is <1.  Yearly risk of stroke: <1.3% (None).  Score of 1=1.3; 2=2.2; 3=3.2; 4=4; 5=6.7; 6=9.8; 7=>9.8) -(CHF; HTN; vasc disease DM,  Male = 1; Age <65 =0; 65-74 = 1,  >75 =2; stroke = 2).    Recommendations:   Craig Morgan  is a 64 y.o.  Caucasian male on tikosyn for symptomatic Afib, leg edmea, here  for follow up. On his last office visit after confirming persist ance of atrial fibrillation, and him being asymptomatic, Tikosyn was discontinued.  He is not on anticoagulation due  to low cardioembolic risk.  He is presently doing well with regard to atrial fibrillation without palpitations, he does have chronic fatigue and has been using CPAP but not as regularly.  He also has chronic leg edema related to venous insufficiency and obesity and diet.  His leg edema is worse, he is having also venous claudication due to venous insufficiency.  I will refer him for interventional radiology to evaluate him for venous ablation as he has been compliant with using support stockings.  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  His labs are stable, lipids are controlled, he does have hyperglycemia but not a diabetic.  Weight loss was discussed at length.  He is severely disabled due to chronic back pain and spinal stenosis.  His vascular examination is mildly abnormal but that will not explain his legs being extremely weak and peripheral neuropathy.   Craig Decamp, MD, [facility] 09/09/2019, 9:08 AM [facility] [place] Pager: [phone] Office: [phone]

## 2019-09-16 ENCOUNTER — Ambulatory Visit: Payer: BC Managed Care – PPO

## 2019-09-16 ENCOUNTER — Other Ambulatory Visit: Payer: Self-pay

## 2019-09-16 DIAGNOSIS — I4819 Other persistent atrial fibrillation: Secondary | ICD-10-CM

## 2019-09-16 DIAGNOSIS — R600 Localized edema: Secondary | ICD-10-CM

## 2019-11-14 ENCOUNTER — Other Ambulatory Visit: Payer: Self-pay | Admitting: Cardiology

## 2019-11-14 DIAGNOSIS — I480 Paroxysmal atrial fibrillation: Secondary | ICD-10-CM

## 2019-12-22 ENCOUNTER — Other Ambulatory Visit: Payer: Self-pay | Admitting: Cardiology

## 2019-12-22 DIAGNOSIS — I4819 Other persistent atrial fibrillation: Secondary | ICD-10-CM

## 2020-03-05 ENCOUNTER — Other Ambulatory Visit: Payer: Self-pay | Admitting: Cardiology

## 2020-06-03 ENCOUNTER — Other Ambulatory Visit: Payer: Self-pay | Admitting: Cardiology

## 2020-06-03 DIAGNOSIS — I4819 Other persistent atrial fibrillation: Secondary | ICD-10-CM

## 2020-09-08 ENCOUNTER — Ambulatory Visit: Payer: 59 | Admitting: Cardiology

## 2020-11-23 ENCOUNTER — Other Ambulatory Visit: Payer: Self-pay | Admitting: Cardiology

## 2020-11-23 DIAGNOSIS — I4819 Other persistent atrial fibrillation: Secondary | ICD-10-CM

## 2021-01-09 ENCOUNTER — Other Ambulatory Visit: Payer: Self-pay | Admitting: Cardiology

## 2021-01-09 DIAGNOSIS — I4819 Other persistent atrial fibrillation: Secondary | ICD-10-CM

## 2021-04-29 ENCOUNTER — Other Ambulatory Visit: Payer: Self-pay | Admitting: Cardiology

## 2021-04-29 DIAGNOSIS — I4819 Other persistent atrial fibrillation: Secondary | ICD-10-CM

## 2021-11-14 ENCOUNTER — Other Ambulatory Visit: Payer: Self-pay | Admitting: Chiropractic Medicine

## 2021-11-14 DIAGNOSIS — R200 Anesthesia of skin: Secondary | ICD-10-CM

## 2021-11-14 DIAGNOSIS — R531 Weakness: Secondary | ICD-10-CM

## 2021-12-02 ENCOUNTER — Other Ambulatory Visit: Payer: BC Managed Care – PPO

## 2022-01-12 ENCOUNTER — Ambulatory Visit
Admission: RE | Admit: 2022-01-12 | Discharge: 2022-01-12 | Disposition: A | Discharge status: Home / Self Care | Payer: Self-pay | Source: Ambulatory Visit | Attending: Chiropractic Medicine | Admitting: Chiropractic Medicine

## 2022-01-12 DIAGNOSIS — R200 Anesthesia of skin: Secondary | ICD-10-CM

## 2022-01-12 DIAGNOSIS — R531 Weakness: Secondary | ICD-10-CM

## 2022-01-25 ENCOUNTER — Ambulatory Visit: Payer: Commercial Managed Care - HMO | Admitting: Cardiology

## 2022-01-25 ENCOUNTER — Encounter: Payer: Self-pay | Admitting: Cardiology

## 2022-01-25 VITALS — BP 131/89 | HR 78 | Resp 16 | Ht 73.0 in | Wt 280.0 lb

## 2022-01-25 DIAGNOSIS — I4821 Permanent atrial fibrillation: Secondary | ICD-10-CM

## 2022-01-25 DIAGNOSIS — M4727 Other spondylosis with radiculopathy, lumbosacral region: Secondary | ICD-10-CM

## 2022-01-25 DIAGNOSIS — Z01818 Encounter for other preprocedural examination: Secondary | ICD-10-CM

## 2022-01-25 DIAGNOSIS — I77810 Thoracic aortic ectasia: Secondary | ICD-10-CM

## 2022-01-25 NOTE — Progress Notes (Unsigned)
Primary Physician/Referring:  Patient, No Pcp Per  Patient ID: Craig Morgan, male    DOB: February 23, 1956, 66 y.o.   MRN: [id number]  Chief Complaint  Patient presents with   Atrial Fibrillation   Medical Clearance    Back surgery   HPI:    HPI: DAJOUR Morgan  is a 66 y.o. Caucasian male on tikosyn for symptomatic Afib, leg edmea, here for follow up.  On his last office visit due to persistent atrial fibrillation, and him being asymptomatic, Tikosyn was discontinued.   He is presently doing well with regard to atrial fibrillation without palpitations of fatigue, he does have chronic fatigue and has been using CPAP but not as regularly.  He also has chronic leg edema related to venous insufficiency and obesity and diet.  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  Past Medical History:  Diagnosis Date   Atrial flutter (HCC) 2012   Back pain    DDD (degenerative disc disease), lumbosacral    Back   Dysrhythmia    history of A FIb   Family history of adverse reaction to anesthesia    Mother - PONV   GERD (gastroesophageal reflux disease)    Numbness    RIGHT LEG - KNEE TO ANKLE - STATES HE WAS GIVEN INJECTION ONCE FOR SCIATIC PROBLEM AND THE NUMBNESS BEGAN AFTER THE INJECTION.   Persistent atrial fibrillation (HCC)    Sciatica    Sleep apnea    uses cpap   Past Surgical History:  Procedure Laterality Date   ADENOIDECTOMY     APPENDECTOMY     CARDIOVERSION  02/04/2012   Procedure: CARDIOVERSION;  Surgeon: Pamella Pert, MD;  Location: [facility] ENDOSCOPY;  Service: Cardiovascular;  Laterality: N/A;   CARDIOVERSION N/A 05/16/2015   Procedure: CARDIOVERSION;  Surgeon: Yates Decamp, MD;  Location: [facility] ENDOSCOPY;  Service: Cardiovascular;  Laterality: N/A;   CARDIOVERSION N/A 06/05/2017   Procedure: CARDIOVERSION;  Surgeon: Elder Negus, MD;  Location: [facility] ENDOSCOPY;  Service: Cardiovascular;  Laterality: N/A;   CARPAL TUNNEL RELEASE  Left 09/24/2017   Procedure: CARPAL TUNNEL RELEASE ENDOSCOPIC;  Surgeon: Christena Flake, MD;  Location: [facility];  Service: Orthopedics;  Laterality: Left;  sleep apnea   CARPAL TUNNEL RELEASE Right 10/29/2017   Procedure: ENDOSCOPIC CARPAL TUNNEL RELEASE WRIST;  Surgeon: Christena Flake, MD;  Location: [facility]GERY [facility];  Service: Orthopedics;  Laterality: Right;  sleep apnea   EYE SURGERY     LASIK EYE SURG X 2 LEFT EYE AND ONCE ON RT EYE   HEMORROIDECTOMY     HERNIA REPAIR     LEFT INGUINAL HERNIA;  2ND SURGERY TO DO REVISION LEFT INGUINAL HERNIA AND REPAIR RT INGUINAL HERNIA   INGUINAL HERNIA REPAIR Left 12/08/2012   Procedure: open left inguinal  EXPLORATION;  Surgeon: Valarie Merino, MD;  Location: [facility] ORS;  Service: General;  Laterality: Left;   SHOULDER ARTHROSCOPY WITH OPEN ROTATOR CUFF REPAIR Left 09/14/2018   Procedure: LEFT SHOULDER ARTHROSCOPY WITH MINIE  OPEN ROTATOR CUFF REPAIR, BICEPS TENODESIS, DISTAL CLAVICLE EXCISION, SUBACROMIAL DECOMPRESSION, SLEEP APNEA;  Surgeon: Signa Kell, MD;  Location: [facility] ORS;  Service: Orthopedics;  Laterality: Left;   TONSILLECTOMY      Social History   Tobacco Use   Smoking status: Former    Packs/day: 1.00    Years: 5.00    Total pack years: 5.00    Types: Cigarettes    Quit  date: 45    Years since quitting: 25.8   Smokeless tobacco: Current    Types: Snuff   Tobacco comments:    Uses dips occasionally.  Substance Use Topics   Alcohol use: Yes    Alcohol/week: 0.0 standard drinks of alcohol    Comment: rare    Marital Status: Single   ROS  Review of Systems  Cardiovascular:  Negative for chest pain, dyspnea on exertion and leg swelling.  Respiratory:  Positive for snoring.    Objective  Blood pressure 131/89, pulse 78, resp. rate 16, height 6\' 1"  (1.854 m), weight 280 lb (127 kg), SpO2 96 %. Body mass index is 36.94 kg/m.   Physical Exam Constitutional:      Appearance: He is well-developed. He is obese.      Comments: Moderately obese  Neck:     Thyroid: No thyromegaly.     Vascular: No JVD.  Cardiovascular:     Rate and Rhythm: Normal rate. Rhythm irregularly irregular.     Pulses: Intact distal pulses.          Carotid pulses are 2+ on the right side and 2+ on the left side.      Popliteal pulses are 2+ on the right side and 1+ on the left side.       Dorsalis pedis pulses are 0 on the right side and 2+ on the left side.       Posterior tibial pulses are 2+ on the right side and 0 on the left side.     Heart sounds: No murmur heard.    No gallop.     Comments: S1 is variable, S2 is normal. 2-3+ pitting leg edema below knee. Pulmonary:     Effort: Pulmonary effort is normal.     Breath sounds: Normal breath sounds.  Abdominal:     General: Bowel sounds are normal.     Palpations: Abdomen is soft.    Radiology:   Laboratory examination:      Latest Ref Rng & Units 11/05/2018    9:33 AM 09/10/2018   12:01 PM 06/05/2017    5:36 AM  CMP  Glucose 65 - 99 mg/dL 94   06/07/2017   BUN 8 - 27 mg/dL 13   13   Creatinine 062 - 1.27 mg/dL 6.94   8.54   Sodium 6.27 - 144 mmol/L 140   141   Potassium 3.5 - 5.2 mmol/L 4.8  3.9  4.1   Chloride 96 - 106 mmol/L 102   105   CO2 20 - 29 mmol/L 23   27   Calcium 8.6 - 10.2 mg/dL 9.6   9.5   Total Protein 6.0 - 8.5 g/dL 6.9     Total Bilirubin 0.0 - 1.2 mg/dL 0.5     Alkaline Phos 39 - 117 IU/L 123     AST 0 - 40 IU/L 18     ALT 0 - 44 IU/L 14         Latest Ref Rng & Units 06/04/2017    8:26 AM 12/02/2012    8:45 AM 09/30/2011    8:52 AM  CBC  WBC 4.0 - 10.5 K/uL 5.7  4.6  7.3   Hemoglobin 13.0 - 17.0 g/dL 12/01/2011  00.9  38.1   Hematocrit 39.0 - 52.0 % 42.3  38.8  47.7   Platelets 150 - 400 K/uL 209  197     Lipid Panel     Component Value Date/Time  CHOL 126 11/05/2018 0934   TRIG 98 11/05/2018 0934   HDL 34 (L) 11/05/2018 0934   CHOLHDL 4.3 06/17/2011 1000   VLDL 21 06/17/2011 1000   LDLCALC 72 11/05/2018 0934   HEMOGLOBIN A1C Lab  Results  Component Value Date   HGBA1C 5.5 11/05/2018   TSH No results for input(s): "TSH" in the last 8760 hours.  Medications   Current Outpatient Medications  Medication Instructions   cholecalciferol (VITAMIN D3) 1,000 Units, Oral, Daily   cyanocobalamin (VITAMIN B12) 1,000 mcg, Oral, Daily   Magnesium Oxide -Mg Supplement 400 MG CAPS TAKE 1 CAPSULE BY MOUTH EVERY DAY   potassium chloride (K-DUR) 10 MEQ tablet 10 mEq, Oral, 2 times daily   rOPINIRole (REQUIP) 4 MG tablet TAKE 1 TABLET BY MOUTH THREE TIMES A DAY AS NEEDED    Cardiac Studies:   Lexiscan myoview 5.31.12 Diaphragmatic attenuation withour ischemia. Normal LVEF.    Echocardiogram 05/22/2017: Left ventricle cavity is normal in size. Mild concentric hypertrophy of the left ventricle. Normal global wall motion. Unable to evaluate diastolic function due to A. Fibrillation. Calculated EF 55%. Left atrial cavity is mildly dilated. Inadequate tricuspid regurgitation jet to estimate pulmonary artery pressure. Normal right atrial pressure. Mildly dilated aortic root measuring 3.9 cm. No significant change compared to previous study dated 05/03/2015.  Lower Extremity Venous Duplex  05/23/2018: Right: Abnormal reflux times were noted in the common femoral vein, great  saphenous vein at the saphenofemoral junction, great saphenous vein at the  prox calf, and great saphenous vein at the mid calf. There is no evidence  of deep vein thrombosis in the  lower extremity within the CFV, FV, and POPV. There is no evidence of  superficial venous thrombosis within the visualized superficial veins.   Left: Abnormal reflux times were noted in the common femoral vein, great  saphenous vein at the saphenofemoral junction, great saphenous vein at the  proximal thigh, origin of the small saphenous vein, prox small saphenous  vein, and mid small saphenous  vein. There is no evidence of deep vein thrombosis in the lower extremity  within  the CFV, FV, and POPV. There is no evidence of superficial venous  thrombosis within the visualized superficial veins.   EKG 09/09/2019: Atrial fibrillation with controlled ventricular sponsor at rate of 52 bpm, normal axis, no evidence of ischemia.  Normal QT interval.  Compared to 11/02/2018, previously heart rate was 80 bpm.  Otherwise no significant change.  EKG 04/22/2018: Normal sinus rhythm at rate of 74 bpm, normal axis. No evidence of ischemia, normal EKG. No significant change from EKG 01/07/2018  Assessment     ICD-10-CM   1. Pre-op evaluation  Z01.818 EKG 12-Lead    2. Aortic root dilatation (HCC)  I77.810 PCV ECHOCARDIOGRAM COMPLETE    3. Permanent atrial fibrillation (HCC)  I48.21 PCV ECHOCARDIOGRAM COMPLETE    4. 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  Yearly risk of stroke: <1.3% (None).  Score of 1=1.3; 2=2.2; 3=3.2; 4=4; 5=6.7; 6=9.8; 7=>9.8) -(CHF; HTN; vasc disease DM,  Male = 1; Age <65 =0; 65-74 = 1,  >75 =2; stroke = 2).    Recommendations:   TEO MOEDE  is a 66 y.o.  Caucasian male on tikosyn for symptomatic Afib, leg edmea, here for follow up. On his last office visit after confirming persist ance of atrial fibrillation, and him being asymptomatic, Tikosyn was discontinued.  He  is not on anticoagulation due to low cardioembolic risk.  He is presently doing well with regard to atrial fibrillation without palpitations, he does have chronic fatigue and has been using CPAP but not as regularly.  He also has chronic leg edema related to venous insufficiency and obesity and diet.  His leg edema is worse, he is having also venous claudication due to venous insufficiency.  I will refer him for interventional radiology to evaluate him for venous ablation as he has been compliant with using support stockings.  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  His labs are stable, lipids  are controlled, he does have hyperglycemia but not a diabetic.  Weight loss was discussed at length.  He is severely disabled due to chronic back pain and spinal stenosis.  His vascular examination is mildly abnormal but that will not explain his legs being extremely weak and peripheral neuropathy.    Yates Decamp, MD, [facility] 01/25/2022, 2:20 PM Office: [phone] Fax: [phone] Pager: [phone]

## 2022-01-30 ENCOUNTER — Ambulatory Visit: Payer: Commercial Managed Care - HMO

## 2022-01-30 DIAGNOSIS — I77810 Thoracic aortic ectasia: Secondary | ICD-10-CM

## 2022-01-30 DIAGNOSIS — I4821 Permanent atrial fibrillation: Secondary | ICD-10-CM

## 2022-01-31 ENCOUNTER — Encounter: Payer: Self-pay | Admitting: Cardiology

## 2022-01-31 NOTE — Progress Notes (Signed)
I sent surgical clearance to Dr. Danielle Dess to proceed with surgery.  Echocardiogram looks good.

## 2022-02-01 NOTE — Progress Notes (Signed)
Called and spoke to patient he voiced understanding

## 2022-02-22 ENCOUNTER — Other Ambulatory Visit: Payer: Self-pay | Admitting: Neurological Surgery

## 2022-03-01 NOTE — Progress Notes (Signed)
Surgical Instructions    Your procedure is scheduled on Thursday December 14th.  Report to [facility] Main Entrance "A" at 7:15 A.M., then check in with the Admitting office.  Call this number if you have problems the morning of surgery:  [phone]   If you have any questions prior to your surgery date call [phone]: Open Monday-Friday 8am-4pm If you experience any cold or flu symptoms such as cough, fever, chills, shortness of breath, etc. between now and your scheduled surgery, please notify us at the above number     Remember:  Do not eat after midnight the night before your surgery  You may drink clear liquids until 6:15am the morning of your surgery.   Clear liquids allowed are: Water, Non-Citrus Juices (without pulp), Carbonated Beverages, Clear Tea, Black Coffee ONLY (NO MILK, CREAM OR POWDERED CREAMER of any kind), and Gatorade    Take these medicines the morning of surgery with A SIP OF WATER: NONE  Follow your surgeon's instructions on when to stop Aspirin.  If no instructions were given by your surgeon then you will need to call the office to get those instructions.     As of today, STOP taking any Aspirin (unless otherwise instructed by your surgeon) Aleve, Naproxen, Ibuprofen, Motrin, Advil, Goody's, BC's, all herbal medications, fish oil, and all vitamins.           Do not wear jewelry  Do not wear lotions, powders,cologne or deodorant. Do not shave 48 hours prior to surgery.  Men may shave face and neck. Do not bring valuables to the hospital. Do not wear nail polish  [place] is not responsible for any belongings or valuables.    Do NOT Smoke (Tobacco/Vaping)  24 hours prior to your procedure  If you use a CPAP at night, you may bring your mask for your overnight stay.   Contacts, glasses, hearing aids, dentures or partials may not be worn into surgery, please bring cases for these belongings   For patients admitted to the hospital, discharge time  will be determined by your treatment team.   Patients discharged the day of surgery will not be allowed to drive home, and someone needs to stay with them for 24 hours.   SURGICAL WAITING ROOM VISITATION Patients having surgery or a procedure may have no more than 2 support people in the waiting area - these visitors may rotate.   Children under the age of 4 must have an adult with them who is not the patient. If the patient needs to stay at the hospital during part of their recovery, the visitor guidelines for inpatient rooms apply. Pre-op nurse will coordinate an appropriate time for 1 support person to accompany patient in pre-op.  This support person may not rotate.   Please refer to [email or web address] for the visitor guidelines for Inpatients (after your surgery is over and you are in a regular room).    Special instructions:    Oral Hygiene is also important to reduce your risk of infection.  Remember - BRUSH YOUR TEETH THE MORNING OF SURGERY WITH YOUR REGULAR TOOTHPASTE   [place]- Preparing For Surgery  Before surgery, you can play an important role. Because skin is not sterile, your skin needs to be as free of germs as possible. You can reduce the number of germs on your skin by washing with CHG (chlorahexidine gluconate) Soap before surgery.  CHG is an antiseptic cleaner which kills germs and bonds with the skin to continue  killing germs even after washing.     Please do not use if you have an allergy to CHG or antibacterial soaps. If your skin becomes reddened/irritated stop using the CHG.  Do not shave (including legs and underarms) for at least 48 hours prior to first CHG shower. It is OK to shave your face.  Please follow these instructions carefully.     Shower the NIGHT BEFORE SURGERY and the MORNING OF SURGERY with CHG Soap.   If you chose to wash your hair, wash your hair first as usual with your normal shampoo.  After you shampoo, rinse your hair and body thoroughly to remove the shampoo.  Then [employer] and genitals (private parts) with your normal soap and rinse thoroughly to remove soap.  After that Use CHG Soap as you would any other liquid soap. You can apply CHG directly to the skin and wash gently with a scrungie or a clean washcloth.   Apply the CHG Soap to your body ONLY FROM THE NECK DOWN.  Do not use on open wounds or open sores. Avoid contact with your eyes, ears, mouth and genitals (private parts). Wash Face and genitals (private parts)  with your normal soap.   Wash thoroughly, paying special attention to the area where your surgery will be performed.  Thoroughly rinse your body with warm water from the neck down.  DO NOT shower/wash with your normal soap after using and rinsing off the CHG Soap.  Pat yourself dry with a CLEAN TOWEL.  Wear CLEAN PAJAMAS to bed the night before surgery  Place CLEAN SHEETS on your bed the night before your surgery  DO NOT SLEEP WITH PETS.   Day of Surgery:  Take a shower with CHG soap. Wear Clean/Comfortable clothing the morning of surgery Do not apply any deodorants/lotions.   Remember to brush your teeth WITH YOUR REGULAR TOOTHPASTE.    If you received a COVID test during your pre-op visit, it is requested that you wear a mask when out in public, stay away from anyone that may not be feeling well, and notify your surgeon if you develop symptoms. If you have been in contact with anyone that has tested positive in the last 10 days, please notify your surgeon.    Please read over the following fact sheets that you were given.

## 2022-03-04 ENCOUNTER — Other Ambulatory Visit: Payer: Self-pay

## 2022-03-04 ENCOUNTER — Encounter (HOSPITAL_COMMUNITY): Payer: Self-pay

## 2022-03-04 ENCOUNTER — Encounter (HOSPITAL_COMMUNITY)
Admission: RE | Admit: 2022-03-04 | Discharge: 2022-03-04 | Disposition: A | Discharge status: Home / Self Care | Payer: Commercial Managed Care - HMO | Source: Ambulatory Visit | Attending: Neurological Surgery | Admitting: Neurological Surgery

## 2022-03-04 VITALS — BP 155/89 | HR 72 | Temp 97.7°F | Resp 18 | Ht 74.0 in | Wt 277.8 lb

## 2022-03-04 DIAGNOSIS — M4310 Spondylolisthesis, site unspecified: Secondary | ICD-10-CM | POA: Insufficient documentation

## 2022-03-04 DIAGNOSIS — G4730 Sleep apnea, unspecified: Secondary | ICD-10-CM | POA: Insufficient documentation

## 2022-03-04 DIAGNOSIS — G4733 Obstructive sleep apnea (adult) (pediatric): Secondary | ICD-10-CM | POA: Insufficient documentation

## 2022-03-04 DIAGNOSIS — Z87891 Personal history of nicotine dependence: Secondary | ICD-10-CM | POA: Insufficient documentation

## 2022-03-04 DIAGNOSIS — I4819 Other persistent atrial fibrillation: Secondary | ICD-10-CM | POA: Insufficient documentation

## 2022-03-04 DIAGNOSIS — K219 Gastro-esophageal reflux disease without esophagitis: Secondary | ICD-10-CM | POA: Insufficient documentation

## 2022-03-04 DIAGNOSIS — Z01812 Encounter for preprocedural laboratory examination: Secondary | ICD-10-CM | POA: Insufficient documentation

## 2022-03-04 DIAGNOSIS — Z01818 Encounter for other preprocedural examination: Secondary | ICD-10-CM

## 2022-03-04 DIAGNOSIS — M549 Dorsalgia, unspecified: Secondary | ICD-10-CM | POA: Insufficient documentation

## 2022-03-04 LAB — SURGICAL PCR SCREEN
MRSA, PCR: NEGATIVE
Staphylococcus aureus: NEGATIVE

## 2022-03-04 LAB — TYPE AND SCREEN
ABO/RH(D): A POS
Antibody Screen: NEGATIVE

## 2022-03-04 LAB — BASIC METABOLIC PANEL
Anion gap: 8 (ref 5–15)
BUN: 16 mg/dL (ref 8–23)
CO2: 25 mmol/L (ref 22–32)
Calcium: 9.1 mg/dL (ref 8.9–10.3)
Chloride: 107 mmol/L (ref 98–111)
Creatinine, Ser: 0.88 mg/dL (ref 0.61–1.24)
GFR, Estimated: >60 mL/min (ref >60)
Glucose, Bld: 100 mg/dL — ABNORMAL HIGH (ref 70–99)
Potassium: 3.8 mmol/L (ref 3.5–5.1)
Sodium: 140 mmol/L (ref 135–145)

## 2022-03-04 LAB — CBC
HCT: 39.8 % (ref 39.0–52.0)
Hemoglobin: 13.4 g/dL (ref 13.0–17.0)
MCH: 33.7 pg (ref 26.0–34.0)
MCHC: 33.7 g/dL (ref 30.0–36.0)
MCV: 100 fL (ref 80.0–100.0)
Platelets: 236 10*3/uL (ref 150–400)
RBC: 3.98 MIL/uL — ABNORMAL LOW (ref 4.22–5.81)
RDW: 13.2 % (ref 11.5–15.5)
WBC: 6.8 10*3/uL (ref 4.0–10.5)
nRBC: 0 % (ref 0.0–0.2)

## 2022-03-04 NOTE — Progress Notes (Signed)
PCP - denies Cardiologist - Dr. Yates Decamp  PPM/ICD - denies   Chest x-ray - 05/21/17 EKG - 01/25/22 Stress Test - 08/23/10 ECHO - 01/30/22 Cardiac Cath - denies  Sleep Study - 07/27/2013, OSA+ CPAP - denies, pt states he is unable to tolerate  DM- denies  Last dose of GLP1 agonist-  n/a   Blood Thinner Instructions: n/a Aspirin Instructions: Hold 7 days. Last dose 02/27/22.  ERAS Protcol - yes, no drink   COVID TEST- n/a   Anesthesia review: yes, cardiac hx  Patient denies shortness of breath, fever, cough and chest pain at PAT appointment   All instructions explained to the patient, with a verbal understanding of the material. Patient agrees to go over the instructions while at home for a better understanding. The opportunity to ask questions was provided.

## 2022-03-05 ENCOUNTER — Encounter (HOSPITAL_COMMUNITY): Payer: Self-pay

## 2022-03-05 NOTE — Anesthesia Preprocedure Evaluation (Signed)
Anesthesia Evaluation  Patient identified by MRN, date of birth, ID band Patient awake    Reviewed: Allergy & Precautions, NPO status , Patient's Chart, lab work & pertinent test results  History of Anesthesia Complications (+) Family history of anesthesia reaction and history of anesthetic complications (family h/o PONV)  Airway Mallampati: III  TM Distance: >3 FB Neck ROM: Full    Dental  (+) Edentulous Upper, Edentulous Lower   Pulmonary neg shortness of breath, sleep apnea , neg COPD, neg recent URI, former smoker   Pulmonary exam normal breath sounds clear to auscultation       Cardiovascular (-) hypertension(-) angina (-) Past MI, (-) Cardiac Stents and (-) CABG + dysrhythmias Atrial Fibrillation  Rhythm:Irregular Rate:Normal  Echocardiogram 01/30/2022: Normal LV systolic function with visual EF 55-60%. Left ventricle cavity is normal in size. Moderate concentric hypertrophy of the left ventricle. Normal global wall motion. Indeterminate diastolic filling pattern. Calculated EF 64%. Left atrial cavity is severely dilated at 46.5 ml/m^2. Right atrial cavity is mildly dilated. Structurally normal mitral valve. Mild (Grade I) mitral regurgitation. Structurally normal tricuspid valve. Mild tricuspid regurgitation. No evidence of pulmonary hypertension. The aortic root is mildly dilated at SOV 4.3cm. Mildly dilated ascending aorta at 4cm. Compared to 08/2019, EF improved from 45% to 55%.    Neuro/Psych neg Seizures  Neuromuscular disease (lumbar radiculopathy, polyneuropathy, sciatica)    GI/Hepatic ,GERD  ,,(+)     substance abuse  marijuana use  Endo/Other  negative endocrine ROS    Renal/GU negative Renal ROS     Musculoskeletal  (+) Arthritis ,    Abdominal  (+) + obese  Peds  Hematology negative hematology ROS (+)   Anesthesia Other Findings   Reproductive/Obstetrics                              Anesthesia Physical Anesthesia Plan  ASA: 3  Anesthesia Plan: General   Post-op Pain Management:    Induction: Intravenous  PONV Risk Score and Plan: 2 and Ondansetron and Dexamethasone  Airway Management Planned: Oral ETT  Additional Equipment:   Intra-op Plan:   Post-operative Plan: Extubation in OR  Informed Consent: I have reviewed the patients History and Physical, chart, labs and discussed the procedure including the risks, benefits and alternatives for the proposed anesthesia with the patient or authorized representative who has indicated his/her understanding and acceptance.     Dental advisory given  Plan Discussed with: CRNA and Anesthesiologist  Anesthesia Plan Comments: (Risks of general anesthesia discussed including, but not limited to, sore throat, hoarse voice, chipped/damaged teeth, injury to vocal cords, nausea and vomiting, allergic reactions, lung infection, heart attack, stroke, and death. All questions answered.   PAT note written 03/05/2022 by Shonna Chock, PA-C.  )       Anesthesia Quick Evaluation

## 2022-03-05 NOTE — Progress Notes (Signed)
Anesthesia Chart Review:  Case: [id number] Date/Time: 03/07/22 0903   Procedure: L3-4 L4-5 PLIF (Back) - RM 21/3c   Anesthesia type: General   Pre-op diagnosis: Spondylolisthesis   [facility] / [facility]   Surgeons: Barnett Abu, MD       DISCUSSION: Patient is a 66 year old male scheduled for the above procedure.  History includes former smoker (quit 03/25/96), afib/flutter (afib DCCV 02/04/12, 05/16/15, 06/05/17, failed Tikosyn), OSA (severe OSA 2013; intolerant to CPAP), venous insufficiency (with chronic LE edema), GERD, back pain, hernia (left IHR 12/08/12), T&A. BMI is consistent with obesity.   Per cardiologist Dr. Jacinto Halim, and after review of updated echo, "Craig Morgan is at low risk, from a cardiac standpoint, for his upcoming procedure: Lumbar laminectomy.  It is ok to proceed without further cardiac testing." (See Letters tab.) In 01/25/22 office note, he stated, "No indication for anticoagulation as his CHA2DS2-VASc score is low. Patient does have severe sleep apnea but does not want to use CPAP as he could not tolerate this. Again reinforced weight loss and consideration for reevaluation of sleep apnea." Six month follow-up planned. He reported last ASA 02/27/22.   Anesthesia team to evaluate on the day of surgery.    VS: BP (!) 155/89   Pulse 72   Temp 36.5 C (Oral)   Resp 18   Ht 6\' 2"  (1.88 m)   Wt 126 kg   SpO2 97%   BMI 35.67 kg/m   PROVIDERS: Patient, No Pcp Per , MD is cardiologist   LABS: Labs reviewed: Acceptable for surgery. (all labs ordered are listed, but only abnormal results are displayed)  Labs Reviewed  BASIC METABOLIC PANEL - Abnormal; Notable for the following components:      Result Value   Glucose, Bld 100 (*)    All other components within normal limits  CBC - Abnormal; Notable for the following components:   RBC 3.98 (*)    All other components within normal limits  SURGICAL PCR SCREEN  TYPE AND SCREEN      IMAGES: MRI L-spine 01/12/22: IMPRESSION: 1. Interval progression of degenerative changes at L3-4 now with moderate to severe spinal canal stenosis, severe narrowing of the bilateral subarticular zones and moderate bilateral neural foraminal narrowing. 2. Mild spinal canal stenosis with mild-to-moderate right and mild left subarticular zone stenosis at L2-3. 3. Mild spinal canal stenosis with moderate narrowing of the bilateral subarticular zones and mild-to-moderate bilateral neural foraminal narrowing at L4-5. 4. Mild right subarticular zone stenosis and mild right neural foraminal narrowing at L5-S1.   EKG: EKG 01/25/2022: Atrial fibrillation with controlled ventricular response at the rate of 81 bpm, leftward axis. No evidence of ischemia. Compared to 09/09/2019, no change.    CV: Echocardiogram 01/30/2022: Normal LV systolic function with visual EF 55-60%. Left ventricle cavity is normal in size. Moderate concentric hypertrophy of the left ventricle. Normal global wall motion. Indeterminate diastolic filling pattern. Calculated EF 64%. Left atrial cavity is severely dilated at 46.5 ml/m^2. Right atrial cavity is mildly dilated. Structurally normal mitral valve.  Mild (Grade I) mitral regurgitation. Structurally normal tricuspid valve.  Mild tricuspid regurgitation. No evidence of pulmonary hypertension. The aortic root is mildly dilated at SOV 4.3cm. Mildly dilated ascending aorta at 4cm. Compared to 08/2019, EF improved from 45% to 55%.   Nuclear stress test 08/23/2010: Conclusion: 1.  Resting EKG shows normal sinus rhythm, no ischemia.  Stress EKG is nondiagnostic for ischemia as it is a pharmacologic stress.  Patient remained asymptomatic and no additional EKG changes. 2.  Perfusion imaging study demonstrated mild soft tissue attenuation consistent with diaphragmatic attenuation.  There was no appearance of ischemia or scar.  The left ventricular systolic function was  normal.  This is a low risk study.  Past Medical History:  Diagnosis Date   Atrial flutter (HCC) 2012   Back pain    DDD (degenerative disc disease), lumbosacral    Back   Dysrhythmia    history of A FIb   Family history of adverse reaction to anesthesia    Mother - PONV   GERD (gastroesophageal reflux disease)    Numbness    RIGHT LEG - KNEE TO ANKLE - STATES HE WAS GIVEN INJECTION ONCE FOR SCIATIC PROBLEM AND THE NUMBNESS BEGAN AFTER THE INJECTION.   Persistent atrial fibrillation (HCC)    Sciatica    Sleep apnea    uses cpap    Past Surgical History:  Procedure Laterality Date   ADENOIDECTOMY     APPENDECTOMY  1965   CARDIOVERSION  02/04/2012   Procedure: CARDIOVERSION;  Surgeon: Pamella Pert, MD;  Location: [facility] ENDOSCOPY;  Service: Cardiovascular;  Laterality: N/A;   CARDIOVERSION N/A 05/16/2015   Procedure: CARDIOVERSION;  Surgeon: Yates Decamp, MD;  Location: [facility] ENDOSCOPY;  Service: Cardiovascular;  Laterality: N/A;   CARDIOVERSION N/A 06/05/2017   Procedure: CARDIOVERSION;  Surgeon: Elder Negus, MD;  Location: [facility] ENDOSCOPY;  Service: Cardiovascular;  Laterality: N/A;   CARPAL TUNNEL RELEASE Left 09/24/2017   Procedure: CARPAL TUNNEL RELEASE ENDOSCOPIC;  Surgeon: Christena Flake, MD;  Location: [facility] SURGERY CNTR;  Service: Orthopedics;  Laterality: Left;  sleep apnea   CARPAL TUNNEL RELEASE Right 10/29/2017   Procedure: ENDOSCOPIC CARPAL TUNNEL RELEASE WRIST;  Surgeon: Christena Flake, MD;  Location: [facility] SURGERY CNTR;  Service: Orthopedics;  Laterality: Right;  sleep apnea   EYE SURGERY     LASIK EYE SURG X 2 LEFT EYE AND ONCE ON RT EYE   HEMORROIDECTOMY     HERNIA REPAIR     LEFT INGUINAL HERNIA;  2ND SURGERY TO DO REVISION LEFT INGUINAL HERNIA AND REPAIR RT INGUINAL HERNIA   INGUINAL HERNIA REPAIR Left 12/08/2012   Procedure: open left inguinal  EXPLORATION;  Surgeon: Valarie Merino, MD;  Location: [facility] ORS;  Service: General;  Laterality: Left;   SHOULDER  ARTHROSCOPY WITH OPEN ROTATOR CUFF REPAIR Left 09/14/2018   Procedure: LEFT SHOULDER ARTHROSCOPY WITH MINIE  OPEN ROTATOR CUFF REPAIR, BICEPS TENODESIS, DISTAL CLAVICLE EXCISION, SUBACROMIAL DECOMPRESSION, SLEEP APNEA;  Surgeon: Signa Kell, MD;  Location: [facility] ORS;  Service: Orthopedics;  Laterality: Left;   TONSILLECTOMY      MEDICATIONS:  Ascorbic Acid (VITAMIN C PO)   aspirin EC 81 MG tablet   B Complex-C (B-COMPLEX WITH VITAMIN C) tablet   Cholecalciferol (VITAMIN D-3 PO)   MAGNESIUM PO   Multiple Vitamins-Minerals (ZINC PO)   POTASSIUM PO   rOPINIRole (REQUIP) 4 MG tablet   No current facility-administered medications for this encounter.    Shonna Chock, PA-C Surgical Short Stay/Anesthesiology [facility] Phone [phone] [facility] Phone [phone] 03/05/2022 10:21 AM

## 2022-03-07 ENCOUNTER — Inpatient Hospital Stay (HOSPITAL_COMMUNITY)
Admission: RE | Admit: 2022-03-07 | Discharge: 2022-03-09 | DRG: 454 | Disposition: A | Discharge status: Home Health | Payer: Commercial Managed Care - HMO | Attending: Neurological Surgery | Admitting: Neurological Surgery

## 2022-03-07 ENCOUNTER — Other Ambulatory Visit: Payer: Self-pay

## 2022-03-07 ENCOUNTER — Inpatient Hospital Stay (HOSPITAL_COMMUNITY): Payer: Commercial Managed Care - HMO

## 2022-03-07 ENCOUNTER — Inpatient Hospital Stay (HOSPITAL_COMMUNITY): Payer: Commercial Managed Care - HMO | Admitting: Vascular Surgery

## 2022-03-07 ENCOUNTER — Inpatient Hospital Stay (HOSPITAL_COMMUNITY)
Admission: RE | Disposition: A | Discharge status: Home / Self Care | Payer: Self-pay | Source: Home / Self Care | Attending: Neurological Surgery

## 2022-03-07 ENCOUNTER — Inpatient Hospital Stay (HOSPITAL_COMMUNITY): Payer: Commercial Managed Care - HMO | Admitting: Anesthesiology

## 2022-03-07 ENCOUNTER — Encounter (HOSPITAL_COMMUNITY): Payer: Self-pay | Admitting: Neurological Surgery

## 2022-03-07 DIAGNOSIS — Z809 Family history of malignant neoplasm, unspecified: Secondary | ICD-10-CM | POA: Diagnosis not present

## 2022-03-07 DIAGNOSIS — I4892 Unspecified atrial flutter: Secondary | ICD-10-CM | POA: Diagnosis present

## 2022-03-07 DIAGNOSIS — Z720 Tobacco use: Secondary | ICD-10-CM

## 2022-03-07 DIAGNOSIS — M48062 Spinal stenosis, lumbar region with neurogenic claudication: Secondary | ICD-10-CM | POA: Diagnosis present

## 2022-03-07 DIAGNOSIS — K219 Gastro-esophageal reflux disease without esophagitis: Secondary | ICD-10-CM | POA: Diagnosis present

## 2022-03-07 DIAGNOSIS — Z7982 Long term (current) use of aspirin: Secondary | ICD-10-CM

## 2022-03-07 DIAGNOSIS — M5416 Radiculopathy, lumbar region: Secondary | ICD-10-CM | POA: Diagnosis present

## 2022-03-07 DIAGNOSIS — M4316 Spondylolisthesis, lumbar region: Secondary | ICD-10-CM

## 2022-03-07 DIAGNOSIS — Z880 Allergy status to penicillin: Secondary | ICD-10-CM | POA: Diagnosis not present

## 2022-03-07 DIAGNOSIS — G4730 Sleep apnea, unspecified: Secondary | ICD-10-CM

## 2022-03-07 DIAGNOSIS — Z8249 Family history of ischemic heart disease and other diseases of the circulatory system: Secondary | ICD-10-CM | POA: Diagnosis not present

## 2022-03-07 DIAGNOSIS — I4891 Unspecified atrial fibrillation: Secondary | ICD-10-CM | POA: Diagnosis not present

## 2022-03-07 DIAGNOSIS — E669 Obesity, unspecified: Secondary | ICD-10-CM | POA: Diagnosis present

## 2022-03-07 DIAGNOSIS — I4819 Other persistent atrial fibrillation: Secondary | ICD-10-CM | POA: Diagnosis present

## 2022-03-07 DIAGNOSIS — Z79899 Other long term (current) drug therapy: Secondary | ICD-10-CM

## 2022-03-07 DIAGNOSIS — Z87891 Personal history of nicotine dependence: Secondary | ICD-10-CM | POA: Diagnosis not present

## 2022-03-07 DIAGNOSIS — Z6835 Body mass index (BMI) 35.0-35.9, adult: Secondary | ICD-10-CM | POA: Diagnosis not present

## 2022-03-07 DIAGNOSIS — Z825 Family history of asthma and other chronic lower respiratory diseases: Secondary | ICD-10-CM | POA: Diagnosis not present

## 2022-03-07 LAB — ABO/RH: ABO/RH(D): A POS

## 2022-03-07 SURGERY — POSTERIOR LUMBAR FUSION 2 LEVEL
Anesthesia: General | Site: Back

## 2022-03-07 MED ORDER — EPHEDRINE SULFATE-NACL 50-0.9 MG/10ML-% IV SOSY
PREFILLED_SYRINGE | INTRAVENOUS | Status: DC | PRN
  Administered 2022-03-07: 5 mg via INTRAVENOUS
  Administered 2022-03-07 (×2): 10 mg via INTRAVENOUS
  Administered 2022-03-07 (×2): 5 mg via INTRAVENOUS

## 2022-03-07 MED ORDER — DEXMEDETOMIDINE HCL IN NACL 80 MCG/20ML IV SOLN
INTRAVENOUS | Status: DC | PRN
  Administered 2022-03-07 (×3): 4 ug via BUCCAL
  Administered 2022-03-07: 8 ug via BUCCAL

## 2022-03-07 MED ORDER — VANCOMYCIN HCL IN DEXTROSE 1-5 GM/200ML-% IV SOLN: 1000.0000 mg | INTRAVENOUS | Status: DC

## 2022-03-07 MED ORDER — CHLORHEXIDINE GLUCONATE CLOTH 2 % EX PADS: 6.0000 | MEDICATED_PAD | Freq: Once | CUTANEOUS | Status: DC

## 2022-03-07 MED ORDER — PROMETHAZINE HCL 25 MG/ML IJ SOLN: 6.2500 mg | INTRAMUSCULAR | Status: DC | PRN

## 2022-03-07 MED ORDER — MIDAZOLAM HCL 2 MG/2ML IJ SOLN
INTRAMUSCULAR | Status: AC
  Filled 2022-03-07: qty 2

## 2022-03-07 MED ORDER — FENTANYL CITRATE (PF) 250 MCG/5ML IJ SOLN
INTRAMUSCULAR | Status: AC
  Filled 2022-03-07: qty 5

## 2022-03-07 MED ORDER — SUGAMMADEX SODIUM 200 MG/2ML IV SOLN
INTRAVENOUS | Status: DC | PRN
  Administered 2022-03-07: 400 mg via INTRAVENOUS

## 2022-03-07 MED ORDER — SODIUM CHLORIDE 0.9% FLUSH
3.0000 mL | Freq: Two times a day (BID) | INTRAVENOUS | Status: DC
  Administered 2022-03-08: 3 mL via INTRAVENOUS

## 2022-03-07 MED ORDER — MORPHINE SULFATE (PF) 2 MG/ML IV SOLN: 2.0000 mg | INTRAVENOUS | Status: DC | PRN

## 2022-03-07 MED ORDER — ROPINIROLE HCL 1 MG PO TABS
4.0000 mg | ORAL_TABLET | Freq: Three times a day (TID) | ORAL | Status: DC | PRN
  Administered 2022-03-08 (×2): 4 mg via ORAL
  Filled 2022-03-07 (×2): qty 4

## 2022-03-07 MED ORDER — DOCUSATE SODIUM 100 MG PO CAPS
100.0000 mg | ORAL_CAPSULE | Freq: Two times a day (BID) | ORAL | Status: DC
  Administered 2022-03-07 – 2022-03-08 (×3): 100 mg via ORAL
  Filled 2022-03-07 (×3): qty 1

## 2022-03-07 MED ORDER — PHENYLEPHRINE 80 MCG/ML (10ML) SYRINGE FOR IV PUSH (FOR BLOOD PRESSURE SUPPORT)
PREFILLED_SYRINGE | INTRAVENOUS | Status: DC | PRN
  Administered 2022-03-07 (×9): 80 ug via INTRAVENOUS
  Administered 2022-03-07: 160 ug via INTRAVENOUS
  Administered 2022-03-07: 80 ug via INTRAVENOUS
  Administered 2022-03-07: 160 ug via INTRAVENOUS

## 2022-03-07 MED ORDER — THROMBIN 5000 UNITS EX SOLR
CUTANEOUS | Status: AC
  Filled 2022-03-07: qty 5000

## 2022-03-07 MED ORDER — LIDOCAINE-EPINEPHRINE 1 %-1:100000 IJ SOLN
INTRAMUSCULAR | Status: AC
  Filled 2022-03-07: qty 1

## 2022-03-07 MED ORDER — SODIUM CHLORIDE 0.9 % IV SOLN: 250.0000 mL | INTRAVENOUS | Status: DC

## 2022-03-07 MED ORDER — POTASSIUM 99 MG PO TABS: ORAL_TABLET | Freq: Every morning | ORAL | Status: DC

## 2022-03-07 MED ORDER — LIDOCAINE 2% (20 MG/ML) 5 ML SYRINGE
INTRAMUSCULAR | Status: DC | PRN
  Administered 2022-03-07: 100 mg via INTRAVENOUS

## 2022-03-07 MED ORDER — SODIUM CHLORIDE 0.9 % IV SOLN: INTRAVENOUS | Status: DC | PRN

## 2022-03-07 MED ORDER — THROMBIN 5000 UNITS EX SOLR
OROMUCOSAL | Status: DC | PRN
  Administered 2022-03-07 (×2): 5 mL via TOPICAL

## 2022-03-07 MED ORDER — CHLORHEXIDINE GLUCONATE 0.12 % MT SOLN
15.0000 mL | Freq: Once | OROMUCOSAL | Status: AC
  Administered 2022-03-07: 15 mL via OROMUCOSAL
  Filled 2022-03-07: qty 15

## 2022-03-07 MED ORDER — THROMBIN 20000 UNITS EX SOLR
CUTANEOUS | Status: AC
  Filled 2022-03-07: qty 20000

## 2022-03-07 MED ORDER — EPHEDRINE 5 MG/ML INJ
INTRAVENOUS | Status: AC
  Filled 2022-03-07: qty 5

## 2022-03-07 MED ORDER — METHOCARBAMOL 500 MG PO TABS
500.0000 mg | ORAL_TABLET | Freq: Four times a day (QID) | ORAL | Status: DC | PRN
  Administered 2022-03-08 – 2022-03-09 (×4): 500 mg via ORAL
  Filled 2022-03-07 (×4): qty 1

## 2022-03-07 MED ORDER — OXYCODONE-ACETAMINOPHEN 5-325 MG PO TABS
1.0000 | ORAL_TABLET | ORAL | Status: DC | PRN
  Administered 2022-03-08 – 2022-03-09 (×7): 2 via ORAL
  Administered 2022-03-09: 1 via ORAL
  Filled 2022-03-07 (×8): qty 2

## 2022-03-07 MED ORDER — POLYETHYLENE GLYCOL 3350 17 G PO PACK: 17.0000 g | PACK | Freq: Every day | ORAL | Status: DC | PRN

## 2022-03-07 MED ORDER — ONDANSETRON HCL 4 MG/2ML IJ SOLN: 4.0000 mg | Freq: Four times a day (QID) | INTRAMUSCULAR | Status: DC | PRN

## 2022-03-07 MED ORDER — BUPIVACAINE HCL (PF) 0.5 % IJ SOLN
INTRAMUSCULAR | Status: DC | PRN
  Administered 2022-03-07: 5 mL

## 2022-03-07 MED ORDER — LACTATED RINGERS IV SOLN: INTRAVENOUS | Status: DC

## 2022-03-07 MED ORDER — PHENYLEPHRINE 80 MCG/ML (10ML) SYRINGE FOR IV PUSH (FOR BLOOD PRESSURE SUPPORT)
PREFILLED_SYRINGE | INTRAVENOUS | Status: AC
  Filled 2022-03-07: qty 10

## 2022-03-07 MED ORDER — MIDAZOLAM HCL 2 MG/2ML IJ SOLN
INTRAMUSCULAR | Status: DC | PRN
  Administered 2022-03-07: 2 mg via INTRAVENOUS

## 2022-03-07 MED ORDER — PHENYLEPHRINE HCL-NACL 20-0.9 MG/250ML-% IV SOLN
INTRAVENOUS | Status: DC | PRN
  Administered 2022-03-07: 30 ug/min via INTRAVENOUS

## 2022-03-07 MED ORDER — VANCOMYCIN HCL 2000 MG/400ML IV SOLN
2000.0000 mg | INTRAVENOUS | Status: AC
  Administered 2022-03-07: 2000 mg via INTRAVENOUS
  Filled 2022-03-07 (×2): qty 400

## 2022-03-07 MED ORDER — LIDOCAINE 2% (20 MG/ML) 5 ML SYRINGE
INTRAMUSCULAR | Status: AC
  Filled 2022-03-07: qty 5

## 2022-03-07 MED ORDER — ACETAMINOPHEN 325 MG PO TABS: 650.0000 mg | ORAL_TABLET | ORAL | Status: DC | PRN

## 2022-03-07 MED ORDER — 0.9 % SODIUM CHLORIDE (POUR BTL) OPTIME
TOPICAL | Status: DC | PRN
  Administered 2022-03-07: 1000 mL

## 2022-03-07 MED ORDER — ROCURONIUM BROMIDE 10 MG/ML (PF) SYRINGE
PREFILLED_SYRINGE | INTRAVENOUS | Status: AC
  Filled 2022-03-07: qty 10

## 2022-03-07 MED ORDER — ROCURONIUM BROMIDE 10 MG/ML (PF) SYRINGE
PREFILLED_SYRINGE | INTRAVENOUS | Status: DC | PRN
  Administered 2022-03-07: 20 mg via INTRAVENOUS
  Administered 2022-03-07: 60 mg via INTRAVENOUS
  Administered 2022-03-07 (×2): 20 mg via INTRAVENOUS
  Administered 2022-03-07: 30 mg via INTRAVENOUS

## 2022-03-07 MED ORDER — FENTANYL CITRATE (PF) 250 MCG/5ML IJ SOLN
INTRAMUSCULAR | Status: DC | PRN
  Administered 2022-03-07: 25 ug via INTRAVENOUS
  Administered 2022-03-07: 100 ug via INTRAVENOUS
  Administered 2022-03-07: 25 ug via INTRAVENOUS

## 2022-03-07 MED ORDER — VANCOMYCIN HCL 1500 MG/300ML IV SOLN
1500.0000 mg | Freq: Once | INTRAVENOUS | Status: AC
  Administered 2022-03-07: 1500 mg via INTRAVENOUS
  Filled 2022-03-07: qty 300

## 2022-03-07 MED ORDER — ALBUMIN HUMAN 5 % IV SOLN: INTRAVENOUS | Status: DC | PRN

## 2022-03-07 MED ORDER — FLEET ENEMA 7-19 GM/118ML RE ENEM: 1.0000 | ENEMA | Freq: Once | RECTAL | Status: DC | PRN

## 2022-03-07 MED ORDER — SUFENTANIL CITRATE 50 MCG/ML IV SOLN
0.2500 ug/kg/h | INTRAVENOUS | Status: DC
  Filled 2022-03-07: qty 1

## 2022-03-07 MED ORDER — ONDANSETRON HCL 4 MG/2ML IJ SOLN
INTRAMUSCULAR | Status: AC
  Filled 2022-03-07: qty 2

## 2022-03-07 MED ORDER — ACETAMINOPHEN 10 MG/ML IV SOLN
INTRAVENOUS | Status: AC
  Filled 2022-03-07: qty 100

## 2022-03-07 MED ORDER — KETAMINE HCL 50 MG/5ML IJ SOSY
PREFILLED_SYRINGE | INTRAMUSCULAR | Status: AC
  Filled 2022-03-07: qty 5

## 2022-03-07 MED ORDER — PHENYLEPHRINE HCL-NACL 20-0.9 MG/250ML-% IV SOLN
INTRAVENOUS | Status: AC
  Filled 2022-03-07: qty 250

## 2022-03-07 MED ORDER — MENTHOL 3 MG MT LOZG: 1.0000 | LOZENGE | OROMUCOSAL | Status: DC | PRN

## 2022-03-07 MED ORDER — ORAL CARE MOUTH RINSE: 15.0000 mL | Freq: Once | OROMUCOSAL | Status: AC

## 2022-03-07 MED ORDER — LIDOCAINE-EPINEPHRINE 1 %-1:100000 IJ SOLN
INTRAMUSCULAR | Status: DC | PRN
  Administered 2022-03-07: 5 mL

## 2022-03-07 MED ORDER — DEXAMETHASONE SODIUM PHOSPHATE 10 MG/ML IJ SOLN
INTRAMUSCULAR | Status: DC | PRN
  Administered 2022-03-07: 10 mg via INTRAVENOUS

## 2022-03-07 MED ORDER — SUFENTANIL CITRATE 50 MCG/ML IV SOLN
0.2500 ug/kg/h | INTRAVENOUS | Status: AC
  Administered 2022-03-07: .2 ug/kg/h via INTRAVENOUS
  Filled 2022-03-07: qty 1

## 2022-03-07 MED ORDER — ALUM & MAG HYDROXIDE-SIMETH 200-200-20 MG/5ML PO SUSP: 30.0000 mL | Freq: Four times a day (QID) | ORAL | Status: DC | PRN

## 2022-03-07 MED ORDER — GLYCOPYRROLATE PF 0.2 MG/ML IJ SOSY
PREFILLED_SYRINGE | INTRAMUSCULAR | Status: DC | PRN
  Administered 2022-03-07: .2 mg via INTRAVENOUS

## 2022-03-07 MED ORDER — HYDROMORPHONE HCL 1 MG/ML IJ SOLN
INTRAMUSCULAR | Status: AC
  Filled 2022-03-07: qty 1

## 2022-03-07 MED ORDER — OXYCODONE HCL 5 MG/5ML PO SOLN: 5.0000 mg | Freq: Once | ORAL | Status: AC | PRN

## 2022-03-07 MED ORDER — OXYCODONE HCL 5 MG PO TABS
5.0000 mg | ORAL_TABLET | Freq: Once | ORAL | Status: AC | PRN
  Administered 2022-03-07: 5 mg via ORAL

## 2022-03-07 MED ORDER — OXYCODONE HCL 5 MG PO TABS
ORAL_TABLET | ORAL | Status: AC
  Filled 2022-03-07: qty 1

## 2022-03-07 MED ORDER — OXYCODONE HCL 5 MG PO TABS: 5.0000 mg | ORAL_TABLET | ORAL | Status: DC | PRN

## 2022-03-07 MED ORDER — SODIUM CHLORIDE (PF) 0.9 % IJ SOLN
INTRAMUSCULAR | Status: AC
  Filled 2022-03-07: qty 10

## 2022-03-07 MED ORDER — KETAMINE HCL 10 MG/ML IJ SOLN
INTRAMUSCULAR | Status: DC | PRN
  Administered 2022-03-07: 25 mg via INTRAVENOUS

## 2022-03-07 MED ORDER — ACETAMINOPHEN 650 MG RE SUPP: 650.0000 mg | RECTAL | Status: DC | PRN

## 2022-03-07 MED ORDER — SENNA 8.6 MG PO TABS
1.0000 | ORAL_TABLET | Freq: Two times a day (BID) | ORAL | Status: DC
  Administered 2022-03-07 – 2022-03-08 (×3): 8.6 mg via ORAL
  Filled 2022-03-07 (×3): qty 1

## 2022-03-07 MED ORDER — BUPIVACAINE HCL (PF) 0.5 % IJ SOLN
INTRAMUSCULAR | Status: AC
  Filled 2022-03-07: qty 30

## 2022-03-07 MED ORDER — PROPOFOL 10 MG/ML IV BOLUS
INTRAVENOUS | Status: AC
  Filled 2022-03-07: qty 20

## 2022-03-07 MED ORDER — PHENOL 1.4 % MT LIQD: 1.0000 | OROMUCOSAL | Status: DC | PRN

## 2022-03-07 MED ORDER — ACETAMINOPHEN 500 MG PO TABS
1000.0000 mg | ORAL_TABLET | Freq: Once | ORAL | Status: AC
  Administered 2022-03-07: 1000 mg via ORAL
  Filled 2022-03-07: qty 2

## 2022-03-07 MED ORDER — BISACODYL 10 MG RE SUPP: 10.0000 mg | Freq: Every day | RECTAL | Status: DC | PRN

## 2022-03-07 MED ORDER — GLYCOPYRROLATE PF 0.2 MG/ML IJ SOSY
PREFILLED_SYRINGE | INTRAMUSCULAR | Status: AC
  Filled 2022-03-07: qty 1

## 2022-03-07 MED ORDER — HYDROMORPHONE HCL 1 MG/ML IJ SOLN
0.2500 mg | INTRAMUSCULAR | Status: DC | PRN
  Administered 2022-03-07 (×2): 0.5 mg via INTRAVENOUS

## 2022-03-07 MED ORDER — ONDANSETRON HCL 4 MG/2ML IJ SOLN
INTRAMUSCULAR | Status: DC | PRN
  Administered 2022-03-07: 4 mg via INTRAVENOUS

## 2022-03-07 MED ORDER — PROPOFOL 10 MG/ML IV BOLUS
INTRAVENOUS | Status: DC | PRN
  Administered 2022-03-07: 20 ug/kg/min via INTRAVENOUS
  Administered 2022-03-07: 200 mg via INTRAVENOUS

## 2022-03-07 MED ORDER — ONDANSETRON HCL 4 MG PO TABS: 4.0000 mg | ORAL_TABLET | Freq: Four times a day (QID) | ORAL | Status: DC | PRN

## 2022-03-07 MED ORDER — ACETAMINOPHEN 10 MG/ML IV SOLN
INTRAVENOUS | Status: DC | PRN
  Administered 2022-03-07: 1000 mg via INTRAVENOUS

## 2022-03-07 MED ORDER — SODIUM CHLORIDE 0.9% FLUSH: 3.0000 mL | INTRAVENOUS | Status: DC | PRN

## 2022-03-07 MED ORDER — METHOCARBAMOL 1000 MG/10ML IJ SOLN: 500.0000 mg | Freq: Four times a day (QID) | INTRAVENOUS | Status: DC | PRN

## 2022-03-07 MED ORDER — MAGNESIUM OXIDE -MG SUPPLEMENT 400 (240 MG) MG PO TABS
200.0000 mg | ORAL_TABLET | Freq: Every morning | ORAL | Status: DC
  Administered 2022-03-08: 200 mg via ORAL
  Filled 2022-03-07: qty 1

## 2022-03-07 SURGICAL SUPPLY — 76 items
ADH SKN CLS APL DERMABOND .7 (GAUZE/BANDAGES/DRESSINGS) ×1
APL SRG 60D 8 XTD TIP BNDBL (TIP)
BAG COUNTER SPONGE SURGICOUNT (BAG) ×2 IMPLANT
BAG SPNG CNTER NS LX DISP (BAG) ×1
BASKET BONE COLLECTION (BASKET) ×2 IMPLANT
BLADE BONE MILL FINE (MISCELLANEOUS) ×1
BLADE BONE MILL MEDIUM (MISCELLANEOUS) ×2 IMPLANT
BLADE CLIPPER SURG (BLADE) IMPLANT
BLADE MILL BN FN STRL DISP (MISCELLANEOUS) IMPLANT
BLADE SURG 10 STRL SS (BLADE) IMPLANT
BONE CANC CHIPS 20CC PCAN1/4 (Bone Implant) ×1 IMPLANT
BUR MATCHSTICK NEURO 3.0 LAGG (BURR) ×2 IMPLANT
CAGE COROENT LG 10X9X23-12 (Cage) IMPLANT
CANISTER SUCT 3000ML PPV (MISCELLANEOUS) ×2 IMPLANT
CHIPS CANC BONE 20CC PCAN1/4 (Bone Implant) ×1 IMPLANT
CNTNR URN SCR LID CUP LEK RST (MISCELLANEOUS) ×2 IMPLANT
CONT SPEC 4OZ STRL OR WHT (MISCELLANEOUS) ×1
COVER BACK TABLE 60X90IN (DRAPES) ×2 IMPLANT
DERMABOND ADVANCED .7 DNX12 (GAUZE/BANDAGES/DRESSINGS) ×2 IMPLANT
DEVICE DISSECT PLASMABLAD 3.0S (MISCELLANEOUS) ×2 IMPLANT
DRAPE C-ARM 42X72 X-RAY (DRAPES) ×4 IMPLANT
DRAPE C-ARMOR (DRAPES) IMPLANT
DRAPE HALF SHEET 40X57 (DRAPES) IMPLANT
DRAPE LAPAROTOMY 100X72X124 (DRAPES) ×2 IMPLANT
DURAPREP 26ML APPLICATOR (WOUND CARE) ×2 IMPLANT
DURASEAL APPLICATOR TIP (TIP) IMPLANT
DURASEAL SPINE SEALANT 3ML (MISCELLANEOUS) IMPLANT
ELECT REM PT RETURN 9FT ADLT (ELECTROSURGICAL) ×1
ELECTRODE REM PT RTRN 9FT ADLT (ELECTROSURGICAL) ×2 IMPLANT
GAUZE 4X4 16PLY ~~LOC~~+RFID DBL (SPONGE) IMPLANT
GAUZE SPONGE 4X4 12PLY STRL (GAUZE/BANDAGES/DRESSINGS) ×2 IMPLANT
GLOVE BIOGEL PI IND STRL 7.0 (GLOVE) IMPLANT
GLOVE BIOGEL PI IND STRL 7.5 (GLOVE) IMPLANT
GLOVE BIOGEL PI IND STRL 8.5 (GLOVE) ×4 IMPLANT
GLOVE ECLIPSE 7.5 STRL STRAW (GLOVE) IMPLANT
GLOVE ECLIPSE 8.5 STRL (GLOVE) ×4 IMPLANT
GLOVE SURG SS PI 6.5 STRL IVOR (GLOVE) IMPLANT
GOWN STRL REUS W/ TWL LRG LVL3 (GOWN DISPOSABLE) IMPLANT
GOWN STRL REUS W/ TWL XL LVL3 (GOWN DISPOSABLE) IMPLANT
GOWN STRL REUS W/TWL 2XL LVL3 (GOWN DISPOSABLE) ×4 IMPLANT
GOWN STRL REUS W/TWL LRG LVL3 (GOWN DISPOSABLE)
GOWN STRL REUS W/TWL XL LVL3 (GOWN DISPOSABLE)
GRAFT BNE CANC CHIPS 1-8 20CC (Bone Implant) IMPLANT
GRAFT BONE PROTEIOS XL 10CC (Orthopedic Implant) IMPLANT
HEMOSTAT POWDER KIT SURGIFOAM (HEMOSTASIS) ×2 IMPLANT
KIT BASIN OR (CUSTOM PROCEDURE TRAY) ×2 IMPLANT
KIT GRAFTMAG DEL NEURO DISP (NEUROSURGERY SUPPLIES) IMPLANT
KIT TURNOVER KIT B (KITS) ×2 IMPLANT
MILL BONE PREP (MISCELLANEOUS) ×2 IMPLANT
NDL SPNL 18GX3.5 QUINCKE PK (NEEDLE) IMPLANT
NEEDLE HYPO 22GX1.5 SAFETY (NEEDLE) ×2 IMPLANT
NEEDLE SPNL 18GX3.5 QUINCKE PK (NEEDLE) IMPLANT
NS IRRIG 1000ML POUR BTL (IV SOLUTION) ×2 IMPLANT
PACK LAMINECTOMY NEURO (CUSTOM PROCEDURE TRAY) ×2 IMPLANT
PAD ARMBOARD 7.5X6 YLW CONV (MISCELLANEOUS) ×6 IMPLANT
PATTIES SURGICAL .5 X1 (DISPOSABLE) ×2 IMPLANT
PLASMABLADE 3.0S (MISCELLANEOUS) ×1
ROD RELINE TI LORD 5.5X70 (Rod) IMPLANT
SCREW LOCK RELINE 5.5 TULIP (Screw) IMPLANT
SCREW RELINE-O POLY 6.5X45 (Screw) IMPLANT
SPIKE FLUID TRANSFER (MISCELLANEOUS) ×2 IMPLANT
SPONGE SURGIFOAM ABS GEL 100 (HEMOSTASIS) IMPLANT
SPONGE T-LAP 4X18 ~~LOC~~+RFID (SPONGE) IMPLANT
SUT PROLENE 6 0 BV (SUTURE) IMPLANT
SUT VIC AB 1 CT1 18XBRD ANBCTR (SUTURE) ×2 IMPLANT
SUT VIC AB 1 CT1 8-18 (SUTURE) ×1
SUT VIC AB 2-0 CP2 18 (SUTURE) ×2 IMPLANT
SUT VIC AB 3-0 SH 8-18 (SUTURE) ×2 IMPLANT
SUT VIC AB 4-0 RB1 18 (SUTURE) ×2 IMPLANT
SYR 3ML LL SCALE MARK (SYRINGE) ×8 IMPLANT
SYR 5ML LL (SYRINGE) IMPLANT
TIP CART INSTAFILL GL 5CC (ORTHOPEDIC DISPOSABLE SUPPLIES) ×1 IMPLANT
TOWEL GREEN STERILE (TOWEL DISPOSABLE) ×2 IMPLANT
TOWEL GREEN STERILE FF (TOWEL DISPOSABLE) ×2 IMPLANT
TRAY FOLEY MTR SLVR 16FR STAT (SET/KITS/TRAYS/PACK) ×2 IMPLANT
WATER STERILE IRR 1000ML POUR (IV SOLUTION) ×2 IMPLANT

## 2022-03-07 NOTE — H&P (Signed)
Craig Morgan is an 66 y.o. male.   Chief Complaint: Back pain. Leg weakness HPI: Jernard Reiber is a 66 year old individual whose had significant difficulty with walking over the years. He also complains of severe back pain.  An MRI demonstrates presence of spondylolisthesis at L3-L4 and L4-L5 with a high-grade stenosis at both these levels.  He has been advised regarding the need for surgical decompression and stabilization of his lumbar spine from L3-L5.  He is admitted for this now.      Past Medical History:  Diagnosis Date   Atrial flutter (HCC) 2012   Back pain    DDD (degenerative disc disease), lumbosacral    Back   Dysrhythmia    history of A FIb   Family history of adverse reaction to anesthesia    Mother - PONV   GERD (gastroesophageal reflux disease)    Numbness    RIGHT LEG - KNEE TO ANKLE - STATES HE WAS GIVEN INJECTION ONCE FOR SCIATIC PROBLEM AND THE NUMBNESS BEGAN AFTER THE INJECTION.   Persistent atrial fibrillation (HCC)    Sciatica    Sleep apnea    intolerant to CPAP (03/04/22)    Past Surgical History:  Procedure Laterality Date   ADENOIDECTOMY     APPENDECTOMY  1965   CARDIOVERSION  02/04/2012   Procedure: CARDIOVERSION;  Surgeon: Pamella Pert, MD;  Location: [facility] ENDOSCOPY;  Service: Cardiovascular;  Laterality: N/A;   CARDIOVERSION N/A 05/16/2015   Procedure: CARDIOVERSION;  Surgeon: Yates Decamp, MD;  Location: [facility] ENDOSCOPY;  Service: Cardiovascular;  Laterality: N/A;   CARDIOVERSION N/A 06/05/2017   Procedure: CARDIOVERSION;  Surgeon: Elder Negus, MD;  Location: [facility] ENDOSCOPY;  Service: Cardiovascular;  Laterality: N/A;   CARPAL TUNNEL RELEASE Left 09/24/2017   Procedure: CARPAL TUNNEL RELEASE ENDOSCOPIC;  Surgeon: Christena Flake, MD;  Location: [facility] SURGERY CNTR;  Service: Orthopedics;  Laterality: Left;  sleep apnea   CARPAL TUNNEL RELEASE Right 10/29/2017   Procedure: ENDOSCOPIC CARPAL TUNNEL RELEASE WRIST;  Surgeon: Christena Flake,  MD;  Location: [facility] SURGERY CNTR;  Service: Orthopedics;  Laterality: Right;  sleep apnea   EYE SURGERY     LASIK EYE SURG X 2 LEFT EYE AND ONCE ON RT EYE   HEMORROIDECTOMY     HERNIA REPAIR     LEFT INGUINAL HERNIA;  2ND SURGERY TO DO REVISION LEFT INGUINAL HERNIA AND REPAIR RT INGUINAL HERNIA   INGUINAL HERNIA REPAIR Left 12/08/2012   Procedure: open left inguinal  EXPLORATION;  Surgeon: Valarie Merino, MD;  Location: [facility] ORS;  Service: General;  Laterality: Left;   SHOULDER ARTHROSCOPY WITH OPEN ROTATOR CUFF REPAIR Left 09/14/2018   Procedure: LEFT SHOULDER ARTHROSCOPY WITH MINIE  OPEN ROTATOR CUFF REPAIR, BICEPS TENODESIS, DISTAL CLAVICLE EXCISION, SUBACROMIAL DECOMPRESSION, SLEEP APNEA;  Surgeon: Signa Kell, MD;  Location: [facility] ORS;  Service: Orthopedics;  Laterality: Left;   TONSILLECTOMY      Family History  Problem Relation Age of Onset   Heart disease Father    Heart attack Father    COPD Mother    Heart attack Mother    Cancer Maternal Grandfather        lung   Social History:  reports that he quit smoking about 25 years ago. His smoking use included cigarettes. He has a 5.00 pack-year smoking history. His smokeless tobacco use includes snuff. He reports current alcohol use of about 1.0 standard drink of alcohol per week. He reports that he does not use drugs.  Allergies:  Allergies  Allergen Reactions   Penicillins Hives    Did it involve swelling of the face/tongue/throat, SOB, or low BP? No Did it involve sudden or severe rash/hives, skin peeling, or any reaction on the inside of your mouth or nose? No Did you need to seek medical attention at a hospital or doctor's office? Unknown  When did it last happen?      childhood allergy If all above answers are "NO", may proceed with cephalosporin use.     Medications Prior to Admission  Medication Sig Dispense Refill   Ascorbic Acid (VITAMIN C PO) Take 1 tablet by mouth in the morning.     aspirin EC 81 MG tablet  Take 81 mg by mouth at bedtime. Swallow whole.     B Complex-C (B-COMPLEX WITH VITAMIN C) tablet Take 1 tablet by mouth in the morning.     Cholecalciferol (VITAMIN D-3 PO) Take 1 tablet by mouth in the morning.     MAGNESIUM PO Take 1 tablet by mouth in the morning.     Multiple Vitamins-Minerals (ZINC PO) Take 1 tablet by mouth in the morning.     POTASSIUM PO Take 1 tablet by mouth in the morning.     rOPINIRole (REQUIP) 4 MG tablet TAKE 1 TABLET BY MOUTH THREE TIMES A DAY AS NEEDED (Patient taking differently: Take 8 mg by mouth at bedtime.) 270 tablet 1    Results for orders placed or performed during the hospital encounter of 03/07/22 (from the past 48 hour(s))  ABO/Rh     Status: None   Collection Time: 03/07/22  7:45 AM  Result Value Ref Range   ABO/RH(D)      A POS Performed at [facility], [address] [place], [place] [address]    No results found.  Review of Systems  Constitutional:  Positive for activity change.  Musculoskeletal:  Positive for back pain, gait problem and myalgias.  Neurological:  Positive for weakness and numbness.  All other systems reviewed and are negative.   Blood pressure 136/76, pulse 69, temperature 98.1 F (36.7 C), temperature source Oral, resp. rate 17, height 6\' 2"  (1.88 m), weight 125.6 kg, SpO2 95 %. Physical Exam Constitutional:      Appearance: He is obese.  HENT:     Head: Normocephalic and atraumatic.     Right Ear: Tympanic membrane, ear canal and external ear normal.     Left Ear: Tympanic membrane, ear canal and external ear normal.     Nose: Nose normal.     Mouth/Throat:     Mouth: Mucous membranes are moist.     Pharynx: Oropharynx is clear.  Eyes:     Extraocular Movements: Extraocular movements intact.     Conjunctiva/sclera: Conjunctivae normal.     Pupils: Pupils are equal, round, and reactive to light.  Cardiovascular:     Rate and Rhythm: Normal rate and regular rhythm.     Pulses: Normal pulses.      Heart sounds: Normal heart sounds.  Pulmonary:     Effort: Pulmonary effort is normal.     Breath sounds: Normal breath sounds.  Abdominal:     General: Abdomen is flat. Bowel sounds are normal.     Palpations: Abdomen is soft.  Musculoskeletal:     Cervical back: Normal range of motion and neck supple.     Comments: Positive straight leg raising at 15 degrees in either lower extremity Patrick's maneuver is negative bilaterally.  Skin:    General: Skin is warm and dry.     Capillary Refill: Capillary refill takes less than 2 seconds.  Neurological:     Mental Status: He is alert.     Comments: Weakness on both tibialis anterior and 4- out of 5.  Iliopsoas is strong at 4+ out of 5.  Quadricep strength 4 out of 5.  Absent reflexes in the patella and the Achilles.  Cranial nerve examination is normal.  Upper extremity strength and reflexes are intact.  Psychiatric:        Mood and Affect: Mood normal.        Behavior: Behavior normal.        Thought Content: Thought content normal.        Judgment: Judgment normal.      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  Plan: Decompression fusion L3-4 and L4-5 posterior interbody arthrodesis L3-L5.  Stefani Dama, MD 03/07/2022, 9:29 AM

## 2022-03-07 NOTE — Anesthesia Procedure Notes (Signed)
Procedure Name: Intubation Date/Time: 03/07/2022 1:00 PM  Performed by: Ester Rink, CRNAPre-anesthesia Checklist: Patient identified, Emergency Drugs available, Suction available and Patient being monitored Patient Re-evaluated:Patient Re-evaluated prior to induction Oxygen Delivery Method: Circle system utilized Preoxygenation: Pre-oxygenation with 100% oxygen Induction Type: IV induction Ventilation: Mask ventilation without difficulty Laryngoscope Size: Mac and 4 Grade View: Grade II Tube type: Oral Tube size: 7.5 mm Number of attempts: 1 Airway Equipment and Method: Stylet and Oral airway Placement Confirmation: ETT inserted through vocal cords under direct vision, positive ETCO2 and breath sounds checked- equal and bilateral Secured at: 23 cm Tube secured with: Tape Dental Injury: Teeth and Oropharynx as per pre-operative assessment

## 2022-03-07 NOTE — Anesthesia Postprocedure Evaluation (Signed)
Anesthesia Post Note  Patient: Craig Morgan  Procedure(s) Performed: LUMBAR THREE-FOUR/FOUR-FIVE POSTERIOR LUMBAR INTERBODY FUSION (Back)     Patient location during evaluation: PACU Anesthesia Type: General Level of consciousness: sedated and patient cooperative Pain management: pain level controlled Vital Signs Assessment: post-procedure vital signs reviewed and stable Respiratory status: spontaneous breathing Cardiovascular status: stable Anesthetic complications: no   There were no known notable events for this encounter.  Last Vitals:  Vitals:   03/07/22 2046 03/07/22 2336  BP: 111/73 120/69  Pulse: 72 80  Resp: 18 18  Temp: 36.6 C 36.6 C  SpO2: 100% 99%    Last Pain:  Vitals:   03/07/22 2336  TempSrc: Oral  PainSc:                  Lewie Loron

## 2022-03-07 NOTE — Progress Notes (Signed)
S/p decompression/fusion. No drain in place.   Vanc 1.5g IV x1  Ulyses Southward, PharmD, [place], AAHIVP, CPP Infectious Disease Pharmacist 03/07/2022 8:47 PM

## 2022-03-07 NOTE — Transfer of Care (Signed)
Immediate Anesthesia Transfer of Care Note  Patient: Craig Morgan  Procedure(s) Performed: LUMBAR THREE-FOUR/FOUR-FIVE POSTERIOR LUMBAR INTERBODY FUSION (Back)  Patient Location: PACU  Anesthesia Type:General  Level of Consciousness: responds to stimulation  Airway & Oxygen Therapy: Patient Spontanous Breathing  Post-op Assessment: Report given to RN and Post -op Vital signs reviewed and stable  Post vital signs: Reviewed and stable  Last Vitals:  Vitals Value Taken Time  BP    Temp    Pulse 96 03/07/22 1902  Resp 15 03/07/22 1902  SpO2 95 % 03/07/22 1902  Vitals shown include unvalidated device data.  Last Pain:  Vitals:   03/07/22 0741  TempSrc:   PainSc: 4       Patients Stated Pain Goal: 2 (03/07/22 0741)  Complications: There were no known notable events for this encounter.

## 2022-03-08 MED ORDER — MAGNESIUM HYDROXIDE 400 MG/5ML PO SUSP
30.0000 mL | Freq: Every day | ORAL | Status: DC
  Filled 2022-03-08 (×2): qty 30

## 2022-03-08 MED ORDER — DEXAMETHASONE 4 MG PO TABS
4.0000 mg | ORAL_TABLET | Freq: Two times a day (BID) | ORAL | Status: DC
  Administered 2022-03-08 – 2022-03-09 (×2): 4 mg via ORAL
  Filled 2022-03-08 (×2): qty 1

## 2022-03-08 MED FILL — Thrombin For Soln 5000 Unit: CUTANEOUS | Qty: 5000 | Status: AC

## 2022-03-08 NOTE — TOC Transition Note (Signed)
Transition of Care [facility]) - CM/SW Discharge Note   Patient Details  Name: Craig Morgan MRN: [id number] Date of Birth: [birth year]  [facility] CM/SW Contact:  Kermit Balo, RN Phone Number: 03/08/2022, 11:10 AM   Clinical Narrative:    Pt lives with daughter. Recommendations are for [facility] therapies.. Pt without a preference. [facility] arranged with information on the AVS.  Pt has transportation home when discharged.   Final next level of care: Home w Home Health Services Barriers to Discharge: No Barriers Identified   Patient Goals and CMS Choice   CMS Medicare.gov Compare Post Acute Care list provided to:: Patient Choice offered to / list presented to : Patient  Discharge Placement                       Discharge Plan and Services                          HH Arranged: PT, OT [facility] Agency: [facility] Date [facility] Agency Contacted: 03/08/22   Representative spoke with at [facility] Agency: Kandee Keen  Social Determinants of Health (SDOH) Interventions     Readmission Risk Interventions     No data to display

## 2022-03-08 NOTE — Progress Notes (Signed)
Patient ID: Craig Morgan, male   DOB: [birth year], 66 y.o.   MRN: [id number] Vital signs are stable however patient is having considerable amount of back pain primarily.  His incision is clean and dry and is back.  Mobilization status is rather poor.  I will add some Decadron to help him decrease his pain.  Continue to mobilize him as tolerated.

## 2022-03-08 NOTE — Progress Notes (Signed)
Orthopedic Tech Progress Note Patient Details:  Craig Morgan Nov 15, 1955 [id number]  Ortho Devices Type of Ortho Device: Lumbar corsett Ortho Device/Splint Interventions: Ordered, Adjustment, Application   Post Interventions Patient Tolerated: Well Instructions Provided: Care of device, Adjustment of device  [name] Post 03/08/2022, 3:29 AM

## 2022-03-08 NOTE — Evaluation (Signed)
Physical Therapy Evaluation  Patient Details Name: Craig Morgan MRN: [id number] DOB: 1956-02-18 Today's Date: 03/08/2022  History of Present Illness  Pt is a 66 y/o male admitted for L3-4 and L4-5 fusion in setting of high grade spinal stenosis at these levels. PMH: atrial flutter, DDD, GERD, sciatica, neuropathy   Clinical Impression  Pt admitted with above diagnosis. At the time of PT eval, pt was able to demonstrate transfers and ambulation with min-mod assist and RW for support. Pt limited by pain throughout session. Pt was educated on precautions, brace application/wearing schedule, appropriate activity progression, and car transfer. Pt currently with functional limitations due to the deficits listed below (see PT Problem List). Pt will benefit from skilled PT to increase their independence and safety with mobility to allow discharge to the venue listed below.         Recommendations for follow up therapy are one component of a multi-disciplinary discharge planning process, led by the attending physician.  Recommendations may be updated based on patient status, additional functional criteria and insurance authorization.  Follow Up Recommendations Home health PT      Assistance Recommended at Discharge Frequent or constant Supervision/Assistance  Patient can return home with the following  A lot of help with walking and/or transfers;A little help with bathing/dressing/bathroom;Assistance with cooking/housework;Assist for transportation;Help with stairs or ramp for entrance    Equipment Recommendations None recommended by PT  Recommendations for Other Services       Functional Status Assessment Patient has had a recent decline in their functional status and demonstrates the ability to make significant improvements in function in a reasonable and predictable amount of time.     Precautions / Restrictions Precautions Precautions: Fall;Back Precaution Booklet Issued: Yes  (comment) Required Braces or Orthoses: Spinal Brace Spinal Brace: Lumbar corset;Applied in sitting position Restrictions Weight Bearing Restrictions: No      Mobility  Bed Mobility Overal bed mobility: Needs Assistance Bed Mobility: Rolling, Sidelying to Sit, Sit to Supine Rolling: Min guard Sidelying to sit: Min assist, HOB elevated   Sit to supine: Min assist   General bed mobility comments: VC's for log roll technique. Pt wanting to sit straight up in bed instead of log rolling. Upon return to bed at end of session pt states he is in too much pain to attempt log roll. HOB was elevated up and LE's were elevated back into bed with assist to minimize strain on low back.    Transfers Overall transfer level: Needs assistance Equipment used: Rolling walker (2 wheels) Transfers: Sit to/from Stand Sit to Stand: Min assist, Mod assist, From elevated surface           General transfer comment: Pt required significantly elevated bed height to attempt sit<>stand. VC's for hand placement on seated surface for safety. Pt utilizing increased effort to power up to full stand.    Ambulation/Gait Ambulation/Gait assistance: Min assist Gait Distance (Feet): 100 Feet Assistive device: Rolling walker (2 wheels) Gait Pattern/deviations: Step-through pattern, Decreased stride length, Trunk flexed Gait velocity: Decreased Gait velocity interpretation: <1.31 ft/sec, indicative of household ambulator   General Gait Details: Pt moving slow and guarded due to pain. Pt pushing walker far out in front of him so he can prop onto walker in a tripod position. VC's for maintenance of precautions however pt only able to maintain upright posture for brief periods.  Stairs            Wheelchair Mobility    Modified Rankin (Stroke Patients  Only)       Balance Overall balance assessment: Needs assistance Sitting-balance support: No upper extremity supported, Feet supported Sitting  balance-Leahy Scale: Fair     Standing balance support: Bilateral upper extremity supported, During functional activity, Reliant on assistive device for balance Standing balance-Leahy Scale: Poor                               Pertinent Vitals/Pain Pain Assessment Pain Assessment: Faces Faces Pain Scale: Hurts whole lot Pain Location: back, feet Pain Descriptors / Indicators: Grimacing, Guarding Pain Intervention(s): Limited activity within patient's tolerance, Monitored during session, Repositioned    Home Living Family/patient expects to be discharged to:: Private residence Living Arrangements: Children Available Help at Discharge: Family;Available 24 hours/day Type of Home: House Home Access: Stairs to enter   [employer] of Steps: 2   Home Layout: One level Home Equipment: Conservation officer, nature (2 wheels);Cane - single point      Prior Function Prior Level of Function : Independent/Modified Independent;Driving             Mobility Comments: using cane primarily for mobility ADLs Comments: reports daughter assists with showering tasks, but able to dress and toilet self. 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     Hand Dominance   Dominant Hand: Right    Extremity/Trunk Assessment   Upper Extremity Assessment Upper Extremity Assessment: Defer to OT evaluation    Lower Extremity Assessment Lower Extremity Assessment: Generalized weakness (Pt reports neuropathy pain in B feet L worse than R.)    Cervical / Trunk Assessment Cervical / Trunk Assessment: Back Surgery  Communication   Communication: No difficulties  Cognition Arousal/Alertness: Awake/alert Behavior During Therapy: WFL for tasks assessed/performed Overall Cognitive Status: Within Functional Limits for tasks assessed                                          General Comments      Exercises     Assessment/Plan    PT Assessment Patient needs  continued PT services  PT Problem List Decreased strength;Decreased activity tolerance;Decreased balance;Decreased mobility;Decreased knowledge of use of DME;Decreased safety awareness;Decreased knowledge of precautions;Pain       PT Treatment Interventions      PT Goals (Current goals can be found in the Care Plan section)  Acute Rehab PT Goals Patient Stated Goal: Decrease pain PT Goal Formulation: With patient Time For Goal Achievement: 03/15/22 Potential to Achieve Goals: Good    Frequency       Co-evaluation               AM-PAC PT "6 Clicks" Mobility  Outcome Measure                  End of Session Equipment Utilized During Treatment: Gait belt;Back brace Activity Tolerance: Patient limited by pain Patient left: in bed;with call bell/phone within reach Nurse Communication: Mobility status PT Visit Diagnosis: Unsteadiness on feet (R26.81);Pain Pain - part of body:  (back)    Time: [id number] PT Time Calculation (min) (ACUTE ONLY): 27 min   Charges:   PT Evaluation $PT Eval Low Complexity: 1 Low PT Treatments $Gait Training: 8-22 mins        Rolinda Roan, PT, DPT Acute Rehabilitation Services Secure Chat Preferred Office: [phone]   Clarene Duke  Johanan Skorupski 03/08/2022, 1:31 PM

## 2022-03-08 NOTE — Op Note (Signed)
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  Lumbar radiculopathy. Postoperative diagnosis: Same Procedure: Bilateral laminotomies and decompression L3-4 and L4-5 with more work than required for simple interbody technique.  Posterior lumbar interbody arthrodesis L3-4 L4-5 with peek spacers local autograft allograft and Proteios. Pedicle screw fixation L3-L5 with posterolateral arthrodesis.  Fluoroscopic guidance. Surgeon: Kristeen Miss First Assistant: Duffy Rhody, MD Anesthesia: General endotracheal Indications: Patient is a 66 year old individual has had a progressive deterioration of his gait with increasing back pain and leg weakness.  He has evidence of a spondylolisthesis that is involved at L3-4 and L4-5.  He has been advised regarding the need for surgery.  He is now to undergo that.  Procedure: The patient was brought to the operating room supine on the stretcher.  After the smooth induction of general endotracheal anesthesia, he was carefully turned prone.  The back was prepped with alcohol DuraPrep and draped in a sterile fashion.  Midline incision was created and carried down to the lumbodorsal fascia 2 radiographs were obtained to identify positively the L3-4 and L4-5 interspaces.  Then laminotomies were created removing the inferior margin lamina of L3 out to including the entirety of the facet at L3-4.  Then the inferior portion of laminar arch of L4 was dissected and removed using high-speed drill.  There is noted to be a fixed spondylolisthesis here with very heavy overgrowth of the facet joints.  These were opened on 1 side and then the other and gradually decompression of the common dural tube and the takeoff of the individual nerve roots was obtained at L3-4 and L4-5.  The L3 nerve roots were collated and decompressed superiorly than the L4 nerve roots bilaterally at the L3-4 space then the L5  nerve roots were individually dissected and decompressed.  The disc spaces were then isolated.  Discectomies were performed totally at the L3-4 and L4-5 space.  The interspace was then sized for appropriate sized spacer was felt that a 10 mm tall 12 degree lordotic spacer measuring 23 mm in length would fit into this interval.  2 spacers were prepared for L4-5 and a total of 15 cc of bone graft which was a combination of the autograft some cortical cancellous chips and Proteios was mixed together and placed into the interspace along with the 2 cages at L3-4 similar process was carried out and here 14 cc of bone graft was packed into the interspace.  This was along with the 2 cages.  Then pedicle entry sites were chosen at L3-L4 and L5.  6.5 x 45 mm screws were then placed using fluoroscopic guidance for each individual screw placement.  The lateral gutters at this time were also decorticated isolating the transverse process of L3-L4 and L5.  An additional 9 cc of bone graft was packed into each lateral gutter completing the posterolateral arthrodesis.  With the pedicle screws in place a 6 standard 70 mm precontoured 5.5 mm diameter rod was placed between the saddles and secured in a neutral construct with a slight bit of lordosis being added.  Final radiographs were then obtained in the AP and lateral projection identified good alignment of the vertebrae and acromial and sagittal planes.  Hemostasis was then carefully checked and once established 25 cc of half percent Marcaine was injected into the paraspinous fascia.  Lumbodorsal fascia was then closed with #1 Vicryl interrupted fashion 2-0 Vicryl was used in the subcutaneous tissues 3-0 and 4-0 Vicryl for the  subcuticular closure Dermabond was placed on the skin.  Patient tolerated procedure well was returned to recovery room in stable condition.  Blood loss was estimated at 500 cc and 180 cc of Cell Saver blood was returned to the patient.

## 2022-03-08 NOTE — Evaluation (Signed)
Occupational Therapy Evaluation Patient Details Name: Craig Morgan MRN: [id number] DOB: Nov 11, 1955 Today's Date: 03/08/2022   History of Present Illness Pt is a 66 y/o male admitted for L3-4 and L4-5 fusion in setting of high grade spinal stenosis at these levels. PMH: atrial flutter, DDD, GERD, sciatica, neuropathy   Clinical Impression   PTA, pt lives with daughter, typically Modified Independent for majority of ADLs and mobility using a cane. Pt presents now limited by post op pain and baseline neuropathy in feet. Educated re: spinal precautions for ADLs, bed mobility, LSO brace mgmt and wearing schedule, and helpful DME at DC (will need BSC to place over toilet). Overall,pt requires Min A for bed mobility, Min A for transfers with RW, and min guard for in-room mobility. Pt requiring increased assist for LB ADLs with up to ModA  needed to manage LB dressing today. Pt reports daughter will be available to assist at DC though due to current deficits, feel HHOT follow up would be beneficial to maximize safety and independence at home.     Recommendations for follow up therapy are one component of a multi-disciplinary discharge planning process, led by the attending physician.  Recommendations may be updated based on patient status, additional functional criteria and insurance authorization.   Follow Up Recommendations  Home health OT     Assistance Recommended at Discharge Intermittent Supervision/Assistance  Patient can return home with the following A little help with bathing/dressing/bathroom;Assistance with cooking/housework;Assist for transportation;Help with stairs or ramp for entrance    Functional Status Assessment  Patient has had a recent decline in their functional status and demonstrates the ability to make significant improvements in function in a reasonable and predictable amount of time.  Equipment Recommendations  BSC/3in1    Recommendations for Other Services        Precautions / Restrictions Precautions Precautions: Fall;Back Precaution Booklet Issued: Yes (comment) Required Braces or Orthoses: Spinal Brace Spinal Brace: Lumbar corset;Applied in sitting position Restrictions Weight Bearing Restrictions: No      Mobility Bed Mobility Overal bed mobility: Needs Assistance Bed Mobility: Rolling, Sidelying to Sit, Sit to Supine Rolling: Min guard Sidelying to sit: Min assist, HOB elevated   Sit to supine: Min assist   General bed mobility comments: assist to lift trunk, use of bedrails and assist to get BLE back to bed with cues for spinal precautions    Transfers Overall transfer level: Needs assistance Equipment used: Rolling walker (2 wheels) Transfers: Sit to/from Stand Sit to Stand: Min assist, From elevated surface           General transfer comment: Initially MinA for 2 standing trials with RW, cues for hand placement and elevated bed. On third attempt and increased effort, pt able to stand with min guard      Balance Overall balance assessment: Needs assistance Sitting-balance support: No upper extremity supported, Feet supported Sitting balance-Leahy Scale: Fair     Standing balance support: Bilateral upper extremity supported, During functional activity, Reliant on assistive device for balance Standing balance-Leahy Scale: Poor                             ADL either performed or assessed with clinical judgement   ADL Overall ADL's : Needs assistance/impaired Eating/Feeding: Independent   Grooming: Supervision/safety;Standing   Upper Body Bathing: Minimal assistance;Sitting   Lower Body Bathing: Minimal assistance;Sit to/from stand   Upper Body Dressing : Set up Upper Body Dressing Details (  indicate cue type and reason): able to manage LSO brace w/o issue Lower Body Dressing: Moderate assistance;Sit to/from stand Lower Body Dressing Details (indicate cue type and reason): assist for underwear around  feet, light assist to pull up over waist. assist for donning/doffing boots Toilet Transfer: Min guard;Ambulation;Rolling walker (2 wheels)   Toileting- Clothing Manipulation and Hygiene: Minimal assistance;Sitting/lateral lean;Sit to/from stand       Functional mobility during ADLs: Min guard;Rolling walker (2 wheels) General ADL Comments: limited by pain and difficulty managing LB ADLs. Increased effort and practice needed for standing with RW from elevated bed. Discussed use of BSC over toilet (pt unsure if he has access to one or not anymore), daughter assist for LB ADLs and use of RW for mobility at home     Vision Ability to See in Adequate Light: 0 Adequate Patient Visual Report: No change from baseline Vision Assessment?: No apparent visual deficits     Perception     Praxis      Pertinent Vitals/Pain Pain Assessment Pain Assessment: Faces Faces Pain Scale: Hurts even more Pain Location: back Pain Descriptors / Indicators: Grimacing, Guarding Pain Intervention(s): Monitored during session, Limited activity within patient's tolerance     Hand Dominance Right   Extremity/Trunk Assessment Upper Extremity Assessment Upper Extremity Assessment: Overall WFL for tasks assessed   Lower Extremity Assessment Lower Extremity Assessment: Defer to PT evaluation   Cervical / Trunk Assessment Cervical / Trunk Assessment: Back Surgery   Communication Communication Communication: No difficulties   Cognition Arousal/Alertness: Awake/alert Behavior During Therapy: WFL for tasks assessed/performed Overall Cognitive Status: Within Functional Limits for tasks assessed                                       General Comments       Exercises     Shoulder Instructions      Home Living Family/patient expects to be discharged to:: Private residence Living Arrangements: Children Available Help at Discharge: Family;Available 24 hours/day Type of Home: House Home  Access: Stairs to enter [employer] of Steps: 2   Home Layout: One level         Firefighter: Standard     Home Equipment: Agricultural consultant (2 wheels);Cane - single point          Prior Functioning/Environment Prior Level of Function : Independent/Modified Independent;Driving             Mobility Comments: using cane primarily for mobility ADLs Comments: reports daughter assists with showering tasks, but able to dress and toilet self. 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        OT Problem List: Pain;Decreased knowledge of use of DME or AE;Decreased knowledge of precautions;Impaired balance (sitting and/or standing);Decreased activity tolerance      OT Treatment/Interventions: Self-care/ADL training;Therapeutic exercise;Energy conservation;DME and/or AE instruction;Therapeutic activities;Patient/family education    OT Goals(Current goals can be found in the care plan section) Acute Rehab OT Goals Patient Stated Goal: pain control, do whatever the doctor's tell me OT Goal Formulation: With patient Time For Goal Achievement: 03/22/22 Potential to Achieve Goals: Good  OT Frequency: Min 2X/week    Co-evaluation              AM-PAC OT "6 Clicks" Daily Activity     Outcome Measure Help from another person eating meals?: None Help from another person taking care of  personal grooming?: A Little Help from another person toileting, which includes using toliet, bedpan, or urinal?: A Little Help from another person bathing (including washing, rinsing, drying)?: A Little Help from another person to put on and taking off regular upper body clothing?: A Little Help from another person to put on and taking off regular lower body clothing?: A Lot 6 Click Score: 18   End of Session Equipment Utilized During Treatment: Rolling walker (2 wheels);Back brace Nurse Communication: Mobility status  Activity Tolerance: Patient limited by pain Patient  left: in bed;with call bell/phone within reach  OT Visit Diagnosis: Unsteadiness on feet (R26.81);Pain Pain - part of body:  (back)                Time: 5277-8242 OT Time Calculation (min): 28 min Charges:  OT General Charges $OT Visit: 1 Visit OT Evaluation $OT Eval Low Complexity: 1 Low OT Treatments $Self Care/Home Management : 8-22 mins  Bradd Canary, OTR/L Acute Rehab Services Office: [phone]   Lorre Munroe 03/08/2022, 9:43 AM

## 2022-03-09 MED ORDER — METHOCARBAMOL 500 MG PO TABS: 500.0000 mg | ORAL_TABLET | Freq: Four times a day (QID) | ORAL | 3 refills | Status: DC | PRN

## 2022-03-09 MED ORDER — DEXAMETHASONE 1 MG PO TABS: ORAL_TABLET | ORAL | 0 refills | Status: DC

## 2022-03-09 MED ORDER — OXYCODONE-ACETAMINOPHEN 5-325 MG PO TABS: 1.0000 | ORAL_TABLET | ORAL | 0 refills | Status: DC | PRN

## 2022-03-09 NOTE — Progress Notes (Signed)
Occupational Therapy Treatment Patient Details Name: Craig Morgan MRN: [id number] DOB: 19-Sep-1955 Today's Date: 03/09/2022   History of present illness Pt is a 66 y/o male admitted for L3-4 and L4-5 fusion in setting of high grade spinal stenosis at these levels. PMH: atrial flutter, DDD, GERD, sciatica, neuropathy   OT comments  Pt progressing towards established OT goals. Using AE for donning pants this session with min A for rise from EOB prior to pulling up pants. Continues to require assist to don boots, but pt's daughter assists at baseline. Reviewing shower transfers with min guard A. Continue to recommend discharge home with HHOT to continue addressing functional independence during transfers.    Recommendations for follow up therapy are one component of a multi-disciplinary discharge planning process, led by the attending physician.  Recommendations may be updated based on patient status, additional functional criteria and insurance authorization.    Follow Up Recommendations  Home health OT     Assistance Recommended at Discharge Intermittent Supervision/Assistance  Patient can return home with the following  A little help with bathing/dressing/bathroom;Assistance with cooking/housework;Assist for transportation;Help with stairs or ramp for entrance   Equipment Recommendations  BSC/3in1    Recommendations for Other Services      Precautions / Restrictions Precautions Precautions: Fall;Back Precaution Booklet Issued: Yes (comment) Precaution Comments: reviewed 3/3 back precautions during ADL . Required Braces or Orthoses: Spinal Brace Spinal Brace: Lumbar corset;Applied in sitting position Restrictions Weight Bearing Restrictions: No Other Position/Activity Restrictions: reviewed brace wear schedule       Mobility Bed Mobility Overal bed mobility: Needs Assistance Bed Mobility: Rolling, Sidelying to Sit, Sit to Sidelying Rolling: Modified independent  (Device/Increase time) Sidelying to sit: HOB elevated, Supervision     Sit to sidelying: Min assist General bed mobility comments: +rail, increased time, assist with BLE back to bed    Transfers Overall transfer level: Needs assistance Equipment used: Rolling walker (2 wheels) Transfers: Sit to/from Stand Sit to Stand: Min assist, From elevated surface           General transfer comment: cues for hand placement and incrased time; assist for steadying during rise     Balance Overall balance assessment: Needs assistance Sitting-balance support: No upper extremity supported, Feet supported Sitting balance-Leahy Scale: Fair     Standing balance support: Bilateral upper extremity supported, Reliant on assistive device for balance, During functional activity Standing balance-Leahy Scale: Poor                             ADL either performed or assessed with clinical judgement   ADL Overall ADL's : Needs assistance/impaired                     Lower Body Dressing: Moderate assistance;Sit to/from stand Lower Body Dressing Details (indicate cue type and reason): Assist for donning/doffing boots/socks. Able to thread pants without A and pull up. Min A to stand Toilet Transfer: Minimal assistance;Ambulation;Rolling walker (2 wheels);BSC/3in1 Toilet Transfer Details (indicate cue type and reason): Min A for rise and min guard for ambulation     Tub/ Shower Transfer: Walk-in shower;Min guard;Ambulation;Rolling walker (2 wheels) Tub/Shower Transfer Details (indicate cue type and reason): simulated in room Functional mobility during ADLs: Min guard;Rolling walker (2 wheels)      Extremity/Trunk Assessment Upper Extremity Assessment Upper Extremity Assessment: Overall WFL for tasks assessed   Lower Extremity Assessment Lower Extremity Assessment: Generalized weakness (Pt reports neuropathy in  bil feet with L greater than R)        Vision   Vision  Assessment?: No apparent visual deficits   Perception     Praxis      Cognition Arousal/Alertness: Awake/alert Behavior During Therapy: WFL for tasks assessed/performed Overall Cognitive Status: Within Functional Limits for tasks assessed                                          Exercises      Shoulder Instructions       General Comments      Pertinent Vitals/ Pain       Pain Assessment Pain Assessment: Faces Faces Pain Scale: Hurts even more Pain Location: back, feet Pain Descriptors / Indicators: Grimacing, Guarding, Sore Pain Intervention(s): Limited activity within patient's tolerance, Monitored during session, Repositioned, Relaxation  Home Living                                          Prior Functioning/Environment              Frequency  Min 2X/week        Progress Toward Goals  OT Goals(current goals can now be found in the care plan section)  Progress towards OT goals: Progressing toward goals  Acute Rehab OT Goals Patient Stated Goal: pain control OT Goal Formulation: With patient Time For Goal Achievement: 03/22/22 Potential to Achieve Goals: Good ADL Goals Pt Will Perform Lower Body Dressing: with modified independence;sit to/from stand;with adaptive equipment Pt Will Transfer to Toilet: with modified independence;ambulating Pt Will Perform Toileting - Clothing Manipulation and hygiene: with modified independence;sit to/from stand  Plan Discharge plan remains appropriate;Frequency remains appropriate    Co-evaluation                 AM-PAC OT "6 Clicks" Daily Activity     Outcome Measure   Help from another person eating meals?: None Help from another person taking care of personal grooming?: A Little Help from another person toileting, which includes using toliet, bedpan, or urinal?: A Little Help from another person bathing (including washing, rinsing, drying)?: A Little Help from another  person to put on and taking off regular upper body clothing?: A Little Help from another person to put on and taking off regular lower body clothing?: A Lot 6 Click Score: 18    End of Session Equipment Utilized During Treatment: Gait belt;Rolling walker (2 wheels)  OT Visit Diagnosis: Unsteadiness on feet (R26.81);Pain Pain - part of body:  (back, BLE)   Activity Tolerance Patient limited by pain   Patient Left in bed;with call bell/phone within reach   Nurse Communication Mobility status        Time: 6767-2094 OT Time Calculation (min): 31 min  Charges: OT General Charges $OT Visit: 1 Visit OT Treatments $Self Care/Home Management : 23-37 mins  Tyler Deis, OTR/L [facility] Acute Rehabilitation Office: [phone]   Myrla Halsted 03/09/2022, 9:32 AM

## 2022-03-09 NOTE — Progress Notes (Signed)
Physical Therapy Treatment Patient Details Name: DENMAN MORTEN MRN: [id number] DOB: 1955-09-07 Today's Date: 03/09/2022   History of Present Illness Pt is a 66 y/o male admitted for L3-4 and L4-5 fusion in setting of high grade spinal stenosis at these levels. PMH: atrial flutter, DDD, GERD, sciatica, neuropathy    PT Comments    Pt required min assist bed mobility, min assist transfers, min guard assist amb 28' with RW, and min guard assist ascend/descend 2 steps with R rail. Reviewed 3/3 back precautions and brace wear schedule. Pt returned to bed at end of session.    Recommendations for follow up therapy are one component of a multi-disciplinary discharge planning process, led by the attending physician.  Recommendations may be updated based on patient status, additional functional criteria and insurance authorization.  Follow Up Recommendations  Home health PT     Assistance Recommended at Discharge Frequent or constant Supervision/Assistance  Patient can return home with the following A lot of help with walking and/or transfers;A little help with bathing/dressing/bathroom;Assistance with cooking/housework;Assist for transportation;Help with stairs or ramp for entrance   Equipment Recommendations  None recommended by PT    Recommendations for Other Services       Precautions / Restrictions Precautions Precautions: Fall;Back Precaution Comments: reviewed 3/3 back precautions. Required Braces or Orthoses: Spinal Brace Spinal Brace: Lumbar corset;Applied in sitting position Restrictions Other Position/Activity Restrictions: reviewed brace wear schedule     Mobility  Bed Mobility Overal bed mobility: Needs Assistance Bed Mobility: Rolling, Sidelying to Sit, Sit to Sidelying Rolling: Modified independent (Device/Increase time) Sidelying to sit: HOB elevated, Supervision     Sit to sidelying: Min assist General bed mobility comments: +rail, increased time, assist with  BLE back to bed    Transfers Overall transfer level: Needs assistance Equipment used: Rolling walker (2 wheels) Transfers: Sit to/from Stand Sit to Stand: Min assist, From elevated surface           General transfer comment: Pt pulling up on RW, increased time    Ambulation/Gait Ambulation/Gait assistance: Min guard Gait Distance (Feet): 65 Feet Assistive device: Rolling walker (2 wheels) Gait Pattern/deviations: Step-through pattern, Decreased stride length, Trunk flexed Gait velocity: decreased Gait velocity interpretation: <1.31 ft/sec, indicative of household ambulator   General Gait Details: cues to stay close to RW, mildly flexed trunk. Heavy reliance on RW   Stairs Stairs: Yes Stairs assistance: Min guard Stair Management: One rail Right, Step to pattern, Sideways, Backwards Number of Stairs: 2 General stair comments: sideways with ascent, backwards with descent. Cues for sequencing   Wheelchair Mobility    Modified Rankin (Stroke Patients Only)       Balance Overall balance assessment: Needs assistance Sitting-balance support: No upper extremity supported, Feet supported Sitting balance-Leahy Scale: Fair     Standing balance support: Bilateral upper extremity supported, Reliant on assistive device for balance, During functional activity Standing balance-Leahy Scale: Poor                              Cognition Arousal/Alertness: Awake/alert Behavior During Therapy: WFL for tasks assessed/performed Overall Cognitive Status: Within Functional Limits for tasks assessed                                          Exercises      General Comments  Pertinent Vitals/Pain Pain Assessment Pain Assessment: Faces Faces Pain Scale: Hurts even more Pain Location: back, feet Pain Descriptors / Indicators: Grimacing, Guarding, Sore Pain Intervention(s): Limited activity within patient's tolerance, Monitored during session,  Repositioned    Home Living                          Prior Function            PT Goals (current goals can now be found in the care plan section) Acute Rehab PT Goals Patient Stated Goal: home Progress towards PT goals: Progressing toward goals    Frequency    Min 5X/week      PT Plan Current plan remains appropriate    Co-evaluation              AM-PAC PT "6 Clicks" Mobility   Outcome Measure  Help needed turning from your back to your side while in a flat bed without using bedrails?: A Little Help needed moving from lying on your back to sitting on the side of a flat bed without using bedrails?: A Little Help needed moving to and from a bed to a chair (including a wheelchair)?: A Little Help needed standing up from a chair using your arms (e.g., wheelchair or bedside chair)?: A Little Help needed to walk in hospital room?: A Little Help needed climbing 3-5 steps with a railing? : A Little 6 Click Score: 18    End of Session Equipment Utilized During Treatment: Gait belt;Back brace Activity Tolerance: Patient tolerated treatment well Patient left: in bed;with call bell/phone within reach Nurse Communication: Mobility status PT Visit Diagnosis: Unsteadiness on feet (R26.81);Pain     Time: 0811-0827 PT Time Calculation (min) (ACUTE ONLY): 16 min  Charges:  $Gait Training: 8-22 mins                     Aida Raider, PT  Office # [phone] Pager [phone]    Ilda Foil 03/09/2022, 8:46 AM

## 2022-03-09 NOTE — Discharge Summary (Signed)
Physician Discharge Summary  Patient ID: Craig Morgan MRN: [id number] DOB/AGE: [birth year] 64 y.o.  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  Lumbar spinal stenosis with neurogenic claudication.  Lumbar radiculopathy. Principal Problem:   Spondylolisthesis at L3-L4 level   Discharged Condition: fair  Hospital Course: He was admitted to undergo surgical decompression L3-L5.  He tolerated surgery well.  He has been having a fair amount of back pain.  Has been medicated with pain medication and a tapering dose of Decadron.  Consults: None  Significant Diagnostic Studies: None  Treatments: surgery: See op note  Discharge Exam: Blood pressure 110/69, pulse 86, temperature 98.3 F (36.8 C), temperature source Oral, resp. rate 20, height 6\' 2"  (1.88 m), weight 125.6 kg, SpO2 98 %. Incision is clean and dry and is back motor function is intact in the lower extremities.  Station and gait is slightly improved  Disposition: Discharge disposition: 01-Home or Self Care       Discharge Instructions     Call MD for:  redness, tenderness, or signs of infection (pain, swelling, redness, odor or green/yellow discharge around incision site)   Complete by: As directed    Call MD for:  severe uncontrolled pain   Complete by: As directed    Call MD for:  temperature >100.4   Complete by: As directed    Diet - low sodium heart healthy   Complete by: As directed    Discharge wound care:   Complete by: As directed    Okay to shower. Do not apply salves or appointments to incision. No heavy lifting with the upper extremities greater than 15 pounds. May resume driving when not requiring pain medication and patient feels comfortable with doing so.   Incentive spirometry RT   Complete by: As directed    Increase activity slowly    Complete by: As directed       Allergies as of 03/09/2022       Reactions   Penicillins Hives   Did it involve swelling of the face/tongue/throat, SOB, or low BP? No Did it involve sudden or severe rash/hives, skin peeling, or any reaction on the inside of your mouth or nose? No Did you need to seek medical attention at a hospital or doctor's office? Unknown  When did it last happen?      childhood allergy If all above answers are "NO", may proceed with cephalosporin use.        Medication List     TAKE these medications    aspirin EC 81 MG tablet Take 81 mg by mouth at bedtime. Swallow whole.   B-complex with vitamin C tablet Take 1 tablet by mouth in the morning.   dexamethasone 1 MG tablet Commonly known as: DECADRON 2 tablets twice daily for 2 days, one tablet twice daily for 2 days, one tablet daily for 2 days. Start on Sunday 2/17   MAGNESIUM PO Take 1 tablet by mouth in the morning.   methocarbamol 500 MG tablet Commonly known as: ROBAXIN Take 1 tablet (500 mg total) by mouth every 6 (six) hours as needed for muscle spasms.   oxyCODONE-acetaminophen 5-325 MG tablet Commonly known as: PERCOCET/ROXICET Take 1-2 tablets by mouth every 4 (four) hours as needed for moderate pain or severe pain.   POTASSIUM PO Take 1 tablet by mouth in the morning.   rOPINIRole 4 MG tablet Commonly known  as: REQUIP TAKE 1 TABLET BY MOUTH THREE TIMES A DAY AS NEEDED What changed:  how much to take when to take this   VITAMIN C PO Take 1 tablet by mouth in the morning.   VITAMIN D-3 PO Take 1 tablet by mouth in the morning.   ZINC PO Take 1 tablet by mouth in the morning.               Discharge Care Instructions  (From admission, onward)           Start     Ordered   03/09/22 0000  Discharge wound care:       Comments: Okay to shower. Do not apply salves or appointments to incision. No heavy lifting with the upper extremities greater than 15 pounds. May  resume driving when not requiring pain medication and patient feels comfortable with doing so.   03/09/22 0812            Follow-up Information     [facility] Follow up.   Specialty: Home Health Services Why: The home health agency will contact you for the first home visit Contact information: [address] [place] [address] [phone]                 Signed: Stefani Dama 03/09/2022, 8:13 AM

## 2022-03-09 NOTE — Progress Notes (Signed)
Patient alert and oriented, ambulate, void, surgical site clean and dry. D/c instructions explain to the patient and daughter  all questions answered. Patient d/c home per order.

## 2022-03-09 NOTE — Plan of Care (Signed)
  Problem: Skin Integrity: Goal: Risk for impaired skin integrity will decrease Outcome: Completed/Met   Problem: Education: Goal: Ability to verbalize activity precautions or restrictions will improve Outcome: Completed/Met Goal: Knowledge of the prescribed therapeutic regimen will improve Outcome: Completed/Met Goal: Understanding of discharge needs will improve Outcome: Completed/Met   Problem: Activity: Goal: Ability to avoid complications of mobility impairment will improve Outcome: Completed/Met Goal: Ability to tolerate increased activity will improve Outcome: Completed/Met Goal: Will remain free from falls Outcome: Completed/Met

## 2022-03-19 ENCOUNTER — Encounter (HOSPITAL_COMMUNITY): Payer: Self-pay | Admitting: Neurological Surgery

## 2022-04-26 MED FILL — Sodium Chloride Irrigation Soln 0.9%: Qty: 3000 | Status: AC

## 2022-04-26 MED FILL — Heparin Sodium (Porcine) Inj 1000 Unit/ML: INTRAMUSCULAR | Qty: 30 | Status: AC

## 2022-04-26 MED FILL — Sodium Chloride IV Soln 0.9%: INTRAVENOUS | Qty: 1000 | Status: AC

## 2022-07-09 ENCOUNTER — Other Ambulatory Visit: Payer: Self-pay

## 2022-07-09 ENCOUNTER — Encounter (HOSPITAL_COMMUNITY): Payer: Self-pay

## 2022-07-09 ENCOUNTER — Emergency Department (HOSPITAL_COMMUNITY): Payer: Medicaid Other

## 2022-07-09 ENCOUNTER — Observation Stay (HOSPITAL_COMMUNITY)
Admission: EM | Admit: 2022-07-09 | Discharge: 2022-07-10 | Disposition: A | Discharge status: Home / Self Care | Payer: Medicaid Other | Attending: Family Medicine | Admitting: Family Medicine

## 2022-07-09 DIAGNOSIS — R55 Syncope and collapse: Secondary | ICD-10-CM | POA: Diagnosis present

## 2022-07-09 DIAGNOSIS — Z79899 Other long term (current) drug therapy: Secondary | ICD-10-CM | POA: Diagnosis not present

## 2022-07-09 DIAGNOSIS — Z6830 Body mass index (BMI) 30.0-30.9, adult: Secondary | ICD-10-CM | POA: Diagnosis not present

## 2022-07-09 DIAGNOSIS — I4819 Other persistent atrial fibrillation: Secondary | ICD-10-CM | POA: Diagnosis not present

## 2022-07-09 DIAGNOSIS — E669 Obesity, unspecified: Secondary | ICD-10-CM | POA: Insufficient documentation

## 2022-07-09 DIAGNOSIS — Z87891 Personal history of nicotine dependence: Secondary | ICD-10-CM | POA: Diagnosis not present

## 2022-07-09 DIAGNOSIS — E6609 Other obesity due to excess calories: Secondary | ICD-10-CM

## 2022-07-09 DIAGNOSIS — R392 Extrarenal uremia: Secondary | ICD-10-CM | POA: Diagnosis not present

## 2022-07-09 DIAGNOSIS — I4891 Unspecified atrial fibrillation: Secondary | ICD-10-CM

## 2022-07-09 DIAGNOSIS — R791 Abnormal coagulation profile: Secondary | ICD-10-CM | POA: Diagnosis not present

## 2022-07-09 DIAGNOSIS — R7989 Other specified abnormal findings of blood chemistry: Secondary | ICD-10-CM

## 2022-07-09 DIAGNOSIS — N179 Acute kidney failure, unspecified: Secondary | ICD-10-CM | POA: Insufficient documentation

## 2022-07-09 DIAGNOSIS — E66811 Obesity, class 1: Secondary | ICD-10-CM

## 2022-07-09 DIAGNOSIS — F1290 Cannabis use, unspecified, uncomplicated: Secondary | ICD-10-CM | POA: Diagnosis not present

## 2022-07-09 DIAGNOSIS — G4733 Obstructive sleep apnea (adult) (pediatric): Secondary | ICD-10-CM

## 2022-07-09 LAB — URINALYSIS, ROUTINE W REFLEX MICROSCOPIC
Bilirubin Urine: NEGATIVE
Glucose, UA: NEGATIVE mg/dL
Hgb urine dipstick: NEGATIVE
Ketones, ur: NEGATIVE mg/dL
Leukocytes,Ua: NEGATIVE
Nitrite: NEGATIVE
Protein, ur: NEGATIVE mg/dL
Specific Gravity, Urine: 1.04 — ABNORMAL HIGH (ref 1.005–1.030)
pH: 5 (ref 5.0–8.0)

## 2022-07-09 LAB — ETHANOL: Alcohol, Ethyl (B): <10 mg/dL (ref <10)

## 2022-07-09 LAB — CBC
HCT: 36.9 % — ABNORMAL LOW (ref 39.0–52.0)
Hemoglobin: 12.1 g/dL — ABNORMAL LOW (ref 13.0–17.0)
MCH: 32.6 pg (ref 26.0–34.0)
MCHC: 32.8 g/dL (ref 30.0–36.0)
MCV: 99.5 fL (ref 80.0–100.0)
Platelets: 220 10*3/uL (ref 150–400)
RBC: 3.71 MIL/uL — ABNORMAL LOW (ref 4.22–5.81)
RDW: 14.6 % (ref 11.5–15.5)
WBC: 10.4 10*3/uL (ref 4.0–10.5)
nRBC: 0 % (ref 0.0–0.2)

## 2022-07-09 LAB — COMPREHENSIVE METABOLIC PANEL
ALT: 31 U/L (ref 0–44)
AST: 38 U/L (ref 15–41)
Albumin: 3.7 g/dL (ref 3.5–5.0)
Alkaline Phosphatase: 71 U/L (ref 38–126)
Anion gap: 11 (ref 5–15)
BUN: 27 mg/dL — ABNORMAL HIGH (ref 8–23)
CO2: 24 mmol/L (ref 22–32)
Calcium: 8.9 mg/dL (ref 8.9–10.3)
Chloride: 108 mmol/L (ref 98–111)
Creatinine, Ser: 1.34 mg/dL — ABNORMAL HIGH (ref 0.61–1.24)
GFR, Estimated: 59 mL/min — ABNORMAL LOW (ref >60)
Glucose, Bld: 112 mg/dL — ABNORMAL HIGH (ref 70–99)
Potassium: 3.8 mmol/L (ref 3.5–5.1)
Sodium: 143 mmol/L (ref 135–145)
Total Bilirubin: 0.8 mg/dL (ref 0.3–1.2)
Total Protein: 6.3 g/dL — ABNORMAL LOW (ref 6.5–8.1)

## 2022-07-09 LAB — RAPID URINE DRUG SCREEN, HOSP PERFORMED
Amphetamines: NOT DETECTED
Barbiturates: NOT DETECTED
Benzodiazepines: NOT DETECTED
Cocaine: NOT DETECTED
Opiates: NOT DETECTED
Tetrahydrocannabinol: POSITIVE — AB

## 2022-07-09 LAB — D-DIMER, QUANTITATIVE: D-Dimer, Quant: 0.97 ug/mL-FEU — ABNORMAL HIGH (ref 0.00–0.50)

## 2022-07-09 LAB — TROPONIN I (HIGH SENSITIVITY)
Troponin I (High Sensitivity): 8 ng/L (ref <18)
Troponin I (High Sensitivity): 6 ng/L (ref <18)

## 2022-07-09 MED ORDER — BACLOFEN 10 MG PO TABS
10.0000 mg | ORAL_TABLET | Freq: Once | ORAL | Status: AC
  Administered 2022-07-09: 10 mg via ORAL
  Filled 2022-07-09: qty 1

## 2022-07-09 MED ORDER — IOHEXOL 350 MG/ML SOLN
75.0000 mL | Freq: Once | INTRAVENOUS | Status: AC | PRN
  Administered 2022-07-09: 75 mL via INTRAVENOUS

## 2022-07-09 MED ORDER — SODIUM CHLORIDE 0.9 % IV BOLUS
1000.0000 mL | Freq: Once | INTRAVENOUS | Status: AC
  Administered 2022-07-09: 1000 mL via INTRAVENOUS

## 2022-07-09 NOTE — ED Notes (Signed)
Lethargic. Oriented x 4. Falls asleep, aroused by voice. Daughter at bedside.

## 2022-07-09 NOTE — ED Notes (Signed)
Pt transported to CT ?

## 2022-07-09 NOTE — ED Triage Notes (Signed)
"  Was mowing the yard and when he stopped he was sitting on lawn mower with lawn mower stopped and had a syncopal episode, on our arrival he had another syncopal episode with [place]. BP was 60 systolic. He got a total of NS bolus in route" per EMS

## 2022-07-09 NOTE — ED Provider Notes (Signed)
[place] EMERGENCY DEPARTMENT AT [facility] Provider Note   CSN: [id number] Arrival date & time: 07/09/22  1742     History  Chief Complaint  Patient presents with   Loss of Consciousness    Craig Morgan is a 67 y.o. male with history of persistent A-fib not on anticoagulation, OSA, peripheral vascular disease, degenerative disc disease who presents the emergency department after syncopal episode.    EMS reported that patient was mowing his lawn, when he stopped and had a syncopal episode.  Upon EMS arrival he had an additional syncopal episode.  His initial systolic blood pressure on scene was 60, he received a total of 1500 mL IVF en route.  Patient states that he did not feel significantly different before passing out, prodromal symptoms.  Reports he has had this in the past when he believes his "heart rate drops too low".  His daughter thinks that he was dehydrated because he was working outside all day.     Loss of Consciousness Associated symptoms: no chest pain and no shortness of breath        Home Medications Prior to Admission medications   Medication Sig Start Date End Date Taking? Authorizing Provider  aspirin EC 81 MG tablet Take 81 mg by mouth at bedtime.   Yes [provider]  B Complex Vitamins (B COMPLEX PO) Take 1 capsule by mouth at bedtime.   Yes [provider]  Capsicum, Cayenne, (CAYENNE PO) Take 1 tablet by mouth daily.   Yes [provider]  MAGNESIUM PO Take 1 tablet by mouth daily.   Yes [provider]  Multiple Vitamins-Minerals (ZINC PO) Take 1 tablet by mouth daily.   Yes [provider]  naproxen sodium (ALEVE) 220 MG tablet Take 220 mg by mouth daily.   Yes [provider]  OVER THE COUNTER MEDICATION Take 1 tablet by mouth at bedtime. [place] supplement   Yes [provider]  OVER THE COUNTER MEDICATION Take 1 tablet by mouth at bedtime. Tumeric/Black  pepper/Ginger supplement   Yes [provider]  POTASSIUM PO Take 1 tablet by mouth at bedtime.   Yes [provider]  rOPINIRole (REQUIP) 4 MG tablet TAKE 1 TABLET BY MOUTH THREE TIMES A DAY AS NEEDED Patient taking differently: Take 12-16 mg by mouth at bedtime. 04/30/21  Yes Yates Decamp, MD  Vitamin D-Vitamin K (VITAMIN K2-VITAMIN D3 PO) Take 1 tablet by mouth daily.   Yes [provider]  dexamethasone (DECADRON) 1 MG tablet 2 tablets twice daily for 2 days, one tablet twice daily for 2 days, one tablet daily for 2 days. Start on Sunday 2/17 Patient not taking: Reported on 07/09/2022 03/09/22   Barnett Abu, MD  methocarbamol (ROBAXIN) 500 MG tablet Take 1 tablet (500 mg total) by mouth every 6 (six) hours as needed for muscle spasms. Patient not taking: Reported on 07/09/2022 03/09/22   Barnett Abu, MD  oxyCODONE-acetaminophen (PERCOCET/ROXICET) 5-325 MG tablet Take 1-2 tablets by mouth every 4 (four) hours as needed for moderate pain or severe pain. Patient not taking: Reported on 07/09/2022 03/09/22   Barnett Abu, MD      Allergies    Penicillins and Aleve [naproxen]    Review of Systems   Review of Systems  Respiratory:  Negative for shortness of breath.   Cardiovascular:  Positive for syncope. Negative for chest pain.  Neurological:  Positive for syncope.  All other systems reviewed and are negative.  Physical Exam Updated Vital Signs BP 118/81   Pulse 77   Temp 97.9 F (36.6 C) (Oral)   Resp 15   Ht  (1.88 m)   Wt 120.2 kg   SpO2 97%   BMI 34.02 kg/m  Physical Exam Vitals and nursing note reviewed.  Constitutional:      Appearance: Normal appearance.  HENT:     Head: Normocephalic and atraumatic.  Eyes:     Conjunctiva/sclera: Conjunctivae normal.  Cardiovascular:     Rate and Rhythm: Normal rate and regular rhythm.     Comments: Mild 1+ nonpitting edema to the bilateral legs Pulmonary:     Effort: Pulmonary effort is normal.  No respiratory distress.     Breath sounds: Normal breath sounds.  Abdominal:     General: There is no distension.     Palpations: Abdomen is soft.     Tenderness: There is no abdominal tenderness.  Skin:    General: Skin is warm and dry.     Comments: Some erythema to the left lower leg, no definitive border  Neurological:     General: No focal deficit present.     Mental Status: He is oriented to person, place, and time. He is lethargic.     Comments: Neuro: Speech is clear, able to follow commands. CN III-XII intact grossly intact. PERRLA. EOMI. Sensation intact throughout. Str 5/5 all extremities.     ED Results / Procedures / Treatments   Labs (all labs ordered are listed, but only abnormal results are displayed) Labs Reviewed  CBC - Abnormal; Notable for the following components:      Result Value   RBC 3.71 (*)    Hemoglobin 12.1 (*)    HCT 36.9 (*)    All other components within normal limits  COMPREHENSIVE METABOLIC PANEL - Abnormal; Notable for the following components:   Glucose, Bld 112 (*)    BUN 27 (*)    Creatinine, Ser 1.34 (*)    Total Protein 6.3 (*)    GFR, Estimated 59 (*)    All other components within normal limits  D-DIMER, QUANTITATIVE - Abnormal; Notable for the following components:   D-Dimer, Quant 0.97 (*)    All other components within normal limits  ETHANOL  URINALYSIS, ROUTINE W REFLEX MICROSCOPIC  RAPID URINE DRUG SCREEN, HOSP PERFORMED  CBG MONITORING, ED  TROPONIN I (HIGH SENSITIVITY)  TROPONIN I (HIGH SENSITIVITY)    EKG EKG Interpretation  Date/Time:  Tuesday July 09 2022 17:56:57 EDT Ventricular Rate:  75 PR Interval:    QRS Duration: 85 QT Interval:  392 QTC Calculation: 438 R Axis:   20 Text Interpretation: Atrial fibrillation Nonspecific T abnormalities, lateral leads No acute changes No significant change since last tracing Confirmed by Derwood Kaplan [phone]) on 07/09/2022 7:18:14 PM  Radiology CT Angio Chest PE W  and/or Wo Contrast  Result Date: 07/09/2022 CLINICAL DATA:  Syncope, hypotension, evaluate for PE EXAM: CT ANGIOGRAPHY CHEST WITH CONTRAST TECHNIQUE: Multidetector CT imaging of the chest was performed using the standard protocol during bolus administration of intravenous contrast. Multiplanar CT image reconstructions and MIPs were obtained to evaluate the vascular anatomy. RADIATION DOSE REDUCTION: This exam was performed according to the departmental dose-optimization program which includes automated exposure control, adjustment of the mA and/or kV according to patient size and/or use of iterative reconstruction technique. CONTRAST:  75mL OMNIPAQUE IOHEXOL 350 MG/ML SOLN COMPARISON:  None Available. FINDINGS: Cardiovascular: Satisfactory opacification of the bilateral pulmonary arteries to the  segmental level. No evidence of pulmonary embolism. Although not tailored for evaluation of the thoracic aorta, there is no evidence of thoracic aortic aneurysm or dissection. The heart is top-normal in size.  No pericardial effusion. Moderate coronary atherosclerosis of the LAD and left circumflex. Mediastinum/Nodes: No suspicious mediastinal lymphadenopathy. Visualized thyroid is unremarkable. Lungs/Pleura: Mild dependent atelectasis in the bilateral upper and lower lobes. No focal consolidation. No suspicious pulmonary nodules. No pleural effusion or pneumothorax. Upper Abdomen: Visualized upper abdomen is notable for an 18 mm cyst in the medial right liver (series 5/image 158), benign. Musculoskeletal: Mild degenerative changes of the visualized thoracolumbar spine. Review of the MIP images confirms the above findings. IMPRESSION: No evidence of pulmonary embolism. Negative CT chest. Electronically Signed   By: Charline Bills M.D.   On: 07/09/2022 21:26    Procedures Procedures    Medications Ordered in ED Medications  baclofen (LIORESAL) tablet 10 mg (has no administration in time range)  sodium chloride  0.9 % bolus 1,000 mL (1,000 mLs Intravenous New Bag/Given 07/09/22 2010)  iohexol (OMNIPAQUE) 350 MG/ML injection 75 mL (75 mLs Intravenous Contrast Given 07/09/22 2110)    ED Course/ Medical Decision Making/ A&P                             Medical Decision Making Amount and/or Complexity of Data Reviewed Labs: ordered. Radiology: ordered.  Risk Prescription drug management. Decision regarding hospitalization.   This patient is a 67 y.o. male  who presents to the ED for concern of syncope.   Differential diagnoses prior to evaluation: The emergent differential diagnosis includes, but is not limited to,  CVA, ACS, arrhythmia, vasovagal syncope, orthostatic hypotension, sepsis, hypoglycemia, electrolyte disturbance, respiratory failure, symptomatic anemia, dehydration, heat injury, polypharmacy, malignancy, anxiety/panic attack. This is not an exhaustive differential.   Past Medical History / Co-morbidities: persistent A-fib not on anticoagulation, OSA, peripheral vascular disease, degenerative disc disease  Additional history: Chart reviewed. Pertinent results include: Follows with Dr Jacinto Halim with [facility], most recent echocardiogram in Nov 2023 with normal LVEF.   Physical Exam: Physical exam performed. The pertinent findings include: Hypotensive to 90/62.  Heart regular rate and rhythm.  Normal respiratory effort, lung sounds clear.  Lab Tests/Imaging studies: I personally interpreted labs/imaging and the pertinent results include: No leukocytosis, stable hemoglobin.  CMP with mild AKI, creatinine 1.34.  Otherwise gross unremarkable.  Initial troponin 8, delta troponin pending.  D-dimer 0.97.  Negative ethanol.  CT PE without acute findings. I agree with the radiologist interpretation.  Cardiac monitoring: EKG obtained and interpreted by my attending physician which shows: atrial fibrillation with rate in the 70s   Medications: I ordered medication including IV  fluids, baclofen for muscle cramps.  I have reviewed the patients home medicines and have made adjustments as needed.  Consultations obtained: I consulted with hospitalist Dr Imogene Burn who will admit.    Disposition: After consideration of the diagnostic results and the patients response to treatment, I feel that patient would benefit from medical admission to the hospital for syncopal workup.  No definitive etiology found for symptoms today.  Increasing concern due to syncope without prodrome, patient's age, history of atrial fibrillation, and hypertension upon ER arrival.  I discussed this case with my attending physician Dr. Rhunette Croft who cosigned this note including patient's presenting symptoms, physical exam, and planned diagnostics and interventions. Attending physician stated agreement with plan or made changes to plan which were implemented.  Final Clinical Impression(s) / ED Diagnoses Final diagnoses:  Syncope, unspecified syncope type  Acute kidney injury    Rx / DC Orders ED Discharge Orders     None      Portions of this report may have been transcribed using voice recognition software. Every effort was made to ensure accuracy; however, inadvertent computerized transcription errors may be present.    Jeanella Flattery 07/09/22 2210    Derwood Kaplan, MD 07/09/22 [phone]

## 2022-07-09 NOTE — ED Notes (Signed)
Daughter at the bedside providing pt w/bed bath

## 2022-07-10 ENCOUNTER — Telehealth: Payer: Self-pay

## 2022-07-10 ENCOUNTER — Observation Stay (HOSPITAL_COMMUNITY): Payer: Medicaid Other

## 2022-07-10 DIAGNOSIS — N179 Acute kidney failure, unspecified: Secondary | ICD-10-CM

## 2022-07-10 DIAGNOSIS — G4733 Obstructive sleep apnea (adult) (pediatric): Secondary | ICD-10-CM | POA: Diagnosis not present

## 2022-07-10 DIAGNOSIS — E6609 Other obesity due to excess calories: Secondary | ICD-10-CM

## 2022-07-10 DIAGNOSIS — I4819 Other persistent atrial fibrillation: Secondary | ICD-10-CM | POA: Diagnosis not present

## 2022-07-10 DIAGNOSIS — I4891 Unspecified atrial fibrillation: Secondary | ICD-10-CM

## 2022-07-10 DIAGNOSIS — E669 Obesity, unspecified: Secondary | ICD-10-CM | POA: Insufficient documentation

## 2022-07-10 DIAGNOSIS — R7989 Other specified abnormal findings of blood chemistry: Secondary | ICD-10-CM

## 2022-07-10 DIAGNOSIS — R55 Syncope and collapse: Secondary | ICD-10-CM | POA: Diagnosis not present

## 2022-07-10 DIAGNOSIS — F1290 Cannabis use, unspecified, uncomplicated: Secondary | ICD-10-CM

## 2022-07-10 LAB — COMPREHENSIVE METABOLIC PANEL
ALT: 33 U/L (ref 0–44)
AST: 39 U/L (ref 15–41)
Albumin: 3.7 g/dL (ref 3.5–5.0)
Alkaline Phosphatase: 72 U/L (ref 38–126)
Anion gap: 10 (ref 5–15)
BUN: 24 mg/dL — ABNORMAL HIGH (ref 8–23)
CO2: 23 mmol/L (ref 22–32)
Calcium: 8.7 mg/dL — ABNORMAL LOW (ref 8.9–10.3)
Chloride: 108 mmol/L (ref 98–111)
Creatinine, Ser: 0.99 mg/dL (ref 0.61–1.24)
GFR, Estimated: >60 mL/min (ref >60)
Glucose, Bld: 102 mg/dL — ABNORMAL HIGH (ref 70–99)
Potassium: 4 mmol/L (ref 3.5–5.1)
Sodium: 141 mmol/L (ref 135–145)
Total Bilirubin: 1.1 mg/dL (ref 0.3–1.2)
Total Protein: 6.4 g/dL — ABNORMAL LOW (ref 6.5–8.1)

## 2022-07-10 LAB — ECHOCARDIOGRAM COMPLETE
AR max vel: 3.66 cm2
AV Area VTI: 3.78 cm2
AV Area mean vel: 3.55 cm2
AV Mean grad: 3 mmHg
AV Peak grad: 4.6 mmHg
Ao pk vel: 1.07 m/s
Area-P 1/2: 4.21 cm2
Calc EF: 55.8 %
Height: 74 in
MV VTI: 3.83 cm2
S' Lateral: 3.9 cm
Single Plane A2C EF: 58.9 %
Single Plane A4C EF: 55.6 %
Weight: 4240 oz

## 2022-07-10 LAB — MAGNESIUM: Magnesium: 2 mg/dL (ref 1.7–2.4)

## 2022-07-10 LAB — TSH: TSH: 1.628 u[IU]/mL (ref 0.350–4.500)

## 2022-07-10 MED ORDER — HEPARIN SODIUM (PORCINE) 5000 UNIT/ML IJ SOLN
5000.0000 [IU] | Freq: Three times a day (TID) | INTRAMUSCULAR | Status: DC
  Administered 2022-07-10 (×2): 5000 [IU] via SUBCUTANEOUS
  Filled 2022-07-10 (×2): qty 1

## 2022-07-10 MED ORDER — ACETAMINOPHEN 325 MG PO TABS: 650.0000 mg | ORAL_TABLET | Freq: Four times a day (QID) | ORAL | Status: DC | PRN

## 2022-07-10 MED ORDER — MAGNESIUM SULFATE 2 GM/50ML IV SOLN
2.0000 g | Freq: Once | INTRAVENOUS | Status: AC
  Administered 2022-07-10: 2 g via INTRAVENOUS
  Filled 2022-07-10: qty 50

## 2022-07-10 MED ORDER — LACTATED RINGERS IV SOLN: INTRAVENOUS | Status: AC

## 2022-07-10 MED ORDER — ASPIRIN 81 MG PO TBEC: 81.0000 mg | DELAYED_RELEASE_TABLET | Freq: Every day | ORAL | Status: DC

## 2022-07-10 MED ORDER — POTASSIUM CHLORIDE CRYS ER 20 MEQ PO TBCR
40.0000 meq | EXTENDED_RELEASE_TABLET | Freq: Once | ORAL | Status: AC
  Administered 2022-07-10: 40 meq via ORAL
  Filled 2022-07-10: qty 2

## 2022-07-10 MED ORDER — ONDANSETRON HCL 4 MG/2ML IJ SOLN: 4.0000 mg | Freq: Four times a day (QID) | INTRAMUSCULAR | Status: DC | PRN

## 2022-07-10 MED ORDER — ONDANSETRON HCL 4 MG PO TABS: 4.0000 mg | ORAL_TABLET | Freq: Four times a day (QID) | ORAL | Status: DC | PRN

## 2022-07-10 MED ORDER — ACETAMINOPHEN 650 MG RE SUPP: 650.0000 mg | Freq: Four times a day (QID) | RECTAL | Status: DC | PRN

## 2022-07-10 NOTE — Assessment & Plan Note (Signed)
Consistent with dehydration.  Continue IV fluids.  Could explain his syncopal episode.  However given his history of atrial fibrillation and prior history of bradycardia, he will need cardiac evaluation.

## 2022-07-10 NOTE — Assessment & Plan Note (Signed)
Pt refused to wear cpap in hospital. On prn O2.

## 2022-07-10 NOTE — Consult Note (Addendum)
CARDIOLOGY CONSULT NOTE  Patient ID: MACLEAN FOISTER MRN: [id number] DOB/AGE: [birth year] 65 y.o.  Admit date: 07/09/2022 Attending physician: Hughie Closs, MD Primary Physician:  Patient, No Pcp Per Outpatient Cardiology Provider: Dr. Jacinto Halim  Inpatient Cardiologist: Tessa Lerner, DO, [facility]  Reason of consultation: Syncope Referring physician: Dr. Carollee Herter   Chief complaint: passing out.   HPI:  Craig Morgan is a 67 y.o. Caucasian male who presents with a chief complaint of "passing out." His past medical history and cardiovascular risk factors include: Atrial flutter/fibrillation, sleep apnea not on device therapy, chronic venous insufficiency, obesity.  Patient is accompanied by his daughter at bedside who also provides collateral history.  Yesterday he was riding a mower was talking to her daughter over the phone.  He started noticing vision changes in his periphery which was progressing more centrally, he was thinking he was having one of the spells, and claims to have passed out.  His daughter comes to evaluate him and he had regained consciousness but he was experiencing nausea and vomiting.  EMS was called and patient was noted to be hypotensive and brought to the ED for further evaluation and management.  He had a similar episode like this about a 1 month ago with similar prodromal symptoms.  Patient states that he may have been dehydrated.  Daughter states that his urine is concentrated.  He also consumes 1 bowl of marijuana for his symptoms of leg jerking.  Denies use of alcohol.  Not on oral anticoagulation for thromboembolic prophylaxis given his low CHA2DS2-VASc SCORE. Marland Kitchen  ALLERGIES: Allergies  Allergen Reactions   Penicillins Hives   Aleve [Naproxen] Other (See Comments)    Tarry stools when taking OTC Aleve twice daily. Pt currently taking once daily with no side effects.    PAST MEDICAL HISTORY: Past Medical History:  Diagnosis Date   Atrial flutter 2012   Back  pain    DDD (degenerative disc disease), lumbosacral    Back   Dysrhythmia    history of A FIb   Family history of adverse reaction to anesthesia    Mother - PONV   GERD (gastroesophageal reflux disease)    Numbness    RIGHT LEG - KNEE TO ANKLE - STATES HE WAS GIVEN INJECTION ONCE FOR SCIATIC PROBLEM AND THE NUMBNESS BEGAN AFTER THE INJECTION.   Persistent atrial fibrillation    Sciatica    Sleep apnea    intolerant to CPAP (03/04/22)    PAST SURGICAL HISTORY: Past Surgical History:  Procedure Laterality Date   ADENOIDECTOMY     APPENDECTOMY  1965   CARDIOVERSION  02/04/2012   Procedure: CARDIOVERSION;  Surgeon: Pamella Pert, MD;  Location: [facility] ENDOSCOPY;  Service: Cardiovascular;  Laterality: N/A;   CARDIOVERSION N/A 05/16/2015   Procedure: CARDIOVERSION;  Surgeon: Yates Decamp, MD;  Location: [facility] ENDOSCOPY;  Service: Cardiovascular;  Laterality: N/A;   CARDIOVERSION N/A 06/05/2017   Procedure: CARDIOVERSION;  Surgeon: Elder Negus, MD;  Location: [facility] ENDOSCOPY;  Service: Cardiovascular;  Laterality: N/A;   CARPAL TUNNEL RELEASE Left 09/24/2017   Procedure: CARPAL TUNNEL RELEASE ENDOSCOPIC;  Surgeon: Christena Flake, MD;  Location: [facility]ERY [facility];  Service: Orthopedics;  Laterality: Left;  sleep apnea   CARPAL TUNNEL RELEASE Right 10/29/2017   Procedure: ENDOSCOPIC CARPAL TUNNEL RELEASE WRIST;  Surgeon: Christena Flake, MD;  Location: [facility] SURGERY CNTR;  Service: Orthopedics;  Laterality: Right;  sleep apnea   EYE SURGERY     LASIK EYE SURG X 2  LEFT EYE AND ONCE ON RT EYE   HEMORROIDECTOMY     HERNIA REPAIR     LEFT INGUINAL HERNIA;  2ND SURGERY TO DO REVISION LEFT INGUINAL HERNIA AND REPAIR RT INGUINAL HERNIA   INGUINAL HERNIA REPAIR Left 12/08/2012   Procedure: open left inguinal  EXPLORATION;  Surgeon: Valarie Merino, MD;  Location: [facility] ORS;  Service: General;  Laterality: Left;   SHOULDER ARTHROSCOPY WITH OPEN ROTATOR CUFF REPAIR Left 09/14/2018   Procedure:  LEFT SHOULDER ARTHROSCOPY WITH MINIE  OPEN ROTATOR CUFF REPAIR, BICEPS TENODESIS, DISTAL CLAVICLE EXCISION, SUBACROMIAL DECOMPRESSION, SLEEP APNEA;  Surgeon: Signa Kell, MD;  Location: [facility] ORS;  Service: Orthopedics;  Laterality: Left;   TONSILLECTOMY      FAMILY HISTORY: The patient's family history includes COPD in his mother; Cancer in his maternal grandfather; Heart attack in his father and mother; Heart disease in his father.   SOCIAL HISTORY:  The patient  reports that he quit smoking about 26 years ago. His smoking use included cigarettes. He has a 5.00 pack-year smoking history. His smokeless tobacco use includes snuff. He reports current alcohol use of about 1.0 standard drink of alcohol per week. He reports that he does not use drugs.  MEDICATIONS: Current Outpatient Medications  Medication Instructions   aspirin EC 81 mg, Oral, Daily at bedtime   B Complex Vitamins (B COMPLEX PO) 1 capsule, Oral, Daily at bedtime   Capsicum, Cayenne, (CAYENNE PO) 1 tablet, Oral, Daily   MAGNESIUM PO 1 tablet, Oral, Daily   Multiple Vitamins-Minerals (ZINC PO) 1 tablet, Oral, Daily   naproxen sodium (ALEVE) 220 mg, Oral, Daily   OVER THE COUNTER MEDICATION 1 tablet, Oral, Daily at bedtime, [place] supplement   OVER THE COUNTER MEDICATION 1 tablet, Oral, Daily at bedtime, Tumeric/Black pepper/Ginger supplement   POTASSIUM PO 1 tablet, Oral, Daily at bedtime   rOPINIRole (REQUIP) 4 MG tablet TAKE 1 TABLET BY MOUTH THREE TIMES A DAY AS NEEDED   Vitamin D-Vitamin K (VITAMIN K2-VITAMIN D3 PO) 1 tablet, Oral, Daily    REVIEW OF SYSTEMS: Review of Systems  Cardiovascular:  Positive for leg swelling and syncope (on arrival). Negative for chest pain, claudication, dyspnea on exertion, irregular heartbeat, near-syncope, orthopnea, palpitations and paroxysmal nocturnal dyspnea.  Respiratory:  Negative for shortness of breath.   Hematologic/Lymphatic: Negative for bleeding problem.   Musculoskeletal:  Negative for muscle cramps and myalgias.  Neurological:  Negative for dizziness and light-headedness.    PHYSICAL EXAMINATION: PHYSICAL EXAM: Temp:  [97.9 F (36.6 C)-98.9 F (37.2 C)] 98.7 F (37.1 C) (04/17 1120) Pulse Rate:  [39-129] 80 (04/17 1345) Resp:  [8-23] 20 (04/17 1345) BP: (90-152)/(57-106) 115/65 (04/17 1345) SpO2:  [80 %-100 %] 100 % (04/17 1345) Weight:  [120.2 kg] 120.2 kg (04/16 1749)  Intake/Output:  Intake/Output Summary (Last 24 hours) at 07/10/2022 1421 Last data filed at 07/10/2022 0441 Gross per 24 hour  Intake 1050 ml  Output --  Net 1050 ml     Net IO Since Admission: 1,050 mL [07/10/22 1421]  Weights:     07/09/2022    5:49 PM 03/07/2022    7:23 AM 03/04/2022   12:52 PM  Last 3 Weights  Weight (lbs) 265 lb 277 lb 277 lb 12.8 oz  Weight (kg) 120.203 kg 125.646 kg 126.009 kg     Physical Exam  Constitutional: No distress.  Age appropriate, appears older than stated age, hemodynamically stable.   HENT:  Frank sign  Neck: No JVD present.  Cardiovascular: Normal rate, S1 normal, S2 normal, intact distal pulses and normal pulses. An irregularly irregular rhythm present. Exam reveals no gallop, no S3 and no S4.  No murmur heard. Pulmonary/Chest: Effort normal and breath sounds normal. No stridor. He has no wheezes. He has no rales.  Abdominal: Soft. Bowel sounds are normal. He exhibits no distension. There is no abdominal tenderness.  Obese.  Musculoskeletal:        General: Edema (Left lower extremity mid shin) present.     Cervical back: Neck supple.  Neurological: He is alert and oriented to person, place, and time. He has intact cranial nerves (2-12).  Skin: Skin is warm and moist.    LAB RESULTS: Chemistry Recent Labs  Lab 07/09/22 1810 07/10/22 0342  NA 143 141  K 3.8 4.0  CL 108 108  CO2 24 23  GLUCOSE 112* 102*  BUN 27* 24*  CREATININE 1.34* 0.99  CALCIUM 8.9 8.7*  PROT 6.3* 6.4*  ALBUMIN 3.7 3.7   AST 38 39  ALT 31 33  ALKPHOS 71 72  BILITOT 0.8 1.1  GFRNONAA 59* >60  ANIONGAP 11 10    Hematology Recent Labs  Lab 07/09/22 1810  WBC 10.4  RBC 3.71*  HGB 12.1*  HCT 36.9*  MCV 99.5  MCH 32.6  MCHC 32.8  RDW 14.6  PLT 220   High Sensitivity Troponin:   Recent Labs  Lab 07/09/22 1810 07/09/22 2213  TROPONINIHS 8 6     Cardiac EnzymesNo results for input(s): "TROPONINI" in the last 168 hours. No results for input(s): "TROPIPOC" in the last 168 hours.  BNPNo results for input(s): "BNP", "PROBNP" in the last 168 hours.  DDimer  Recent Labs  Lab 07/09/22 1941  DDIMER 0.97*    Hemoglobin A1c:  Lab Results  Component Value Date   HGBA1C 5.5 11/05/2018   TSH  Recent Labs    07/10/22 0342  TSH 1.628   Lipid Panel  Lab Results  Component Value Date   CHOL 126 11/05/2018   HDL 34 (L) 11/05/2018   LDLCALC 72 11/05/2018   TRIG 98 11/05/2018   CHOLHDL 4.3 06/17/2011   Drugs of Abuse     Component Value Date/Time   LABOPIA NONE DETECTED 07/09/2022 2213   COCAINSCRNUR NONE DETECTED 07/09/2022 2213   LABBENZ NONE DETECTED 07/09/2022 2213   AMPHETMU NONE DETECTED 07/09/2022 2213   THCU POSITIVE (A) 07/09/2022 2213   LABBARB NONE DETECTED 07/09/2022 2213      CARDIAC DATABASE: RISK CALCULATORS Click Here to Calculate/Change CHADS2VASc Score The patient's CHADS2-VASc score is 0, indicating a 0.2% annual risk of stroke.  Therefore, anticoagulation is not recommended.   CHF History: No HTN History: No Diabetes History: No Stroke History: No Vascular Disease History: No  EKG: July 09, 2022: Atrial fibrillation, 75 bpm, nonspecific T wave abnormality  Echocardiogram:  05/22/2017: Left ventricle cavity is normal in size. Mild concentric hypertrophy of the left ventricle. Normal global wall motion. Unable to evaluate diastolic function due to A. Fibrillation. Calculated EF 55%. Left atrial cavity is mildly dilated. Inadequate tricuspid regurgitation jet  to estimate pulmonary artery pressure. Normal right atrial pressure. Mildly dilated aortic root measuring 3.9 cm. No significant change compared to previous study dated 05/03/2015.  Stress Testing:  Lexiscan myoview 5.31.12 Diaphragmatic attenuation withour ischemia. Normal LVEF.   Scheduled Meds:  aspirin EC  81 mg Oral QHS   heparin  5,000 Units Subcutaneous Q8H    Continuous Infusions:    PRN Meds:  acetaminophen **OR** acetaminophen, ondansetron **OR** ondansetron (ZOFRAN) IV  IMPRESSION & RECOMMENDATIONS: LORIMER TIBERIO is a 67 y.o. Caucasian male whose past medical history and cardiovascular risk factors include: Atrial flutter/fibrillation, sleep apnea not on device therapy, chronic venous insufficiency, obesity.  Impression:  Syncope-likely secondary to orthostasis/dehydration/TCH use Atrial fibrillation controlled ventricular rate Acute kidney injury-resolved Prerenal azotemia-improving Elevated D-dimers-CT PE protocol negative for pulmonary embolism Marijuana use Sleep apnea not on device therapy  Plan:  Syncope: Given his prodromal symptoms likely secondary to orthostasis/vasovagal Dehydration-labs illustrate prerenal azotemia, AKI, specific gravity suggestive of concentrated urine EKG: Rate controlled atrial fibrillation without acute injury pattern. High sensitive troponins negative x 2 D-dimer elevated, CT PE protocol negative for pulmonary embolism. I suspect that his prerenal azotemia/dehydration is due to poor hydration along with nausea and vomiting that may be due to hyperemesis likely secondary to the amount of marijuana consumption.  Educated the patient and daughter with regards to reducing marijuana consumption (states he consumes a bowl of it). But they found this funny (daughter was laughing).  I explained to them that given his dehydration along w/ nausea and vomiting he may be precipitating episodes of orthostasis leading to syncope.  Echocardiogram  pending.  As long as the LVEF is within acceptable limits, no significant valvular heart disease, and no regional wall motion normalities patient can be discharged home with close follow-up and continued outpatient workup. Patient and daughter agreed and want to be discharged home as soon as possible as he is back to baseline.  Follow-up visit with Dr. Jacinto Halim has been created and discharge paperwork updated. At follow-up consider either discussing Holter monitor versus loop recorder implant given his syncopal events. Given the number of syncopal events I discussed with him and he is aware of [place] driving laws to stop driving or operate machinery after an episode of loss of consciousness until 6 months event-free.  Atrial fibrillation controlled ventricular rate: Based on EMR he has undergone cardioversion as well as antiarrhythmics in the past.   Given his low CHA2DS2-VASc score he is currently on aspirin for thromboembolic prophylaxis. Will discuss A-fib management with his primary cardiologist at the upcoming visit. Educated him on the importance of improving his modifiable cardiovascular risk factors such as addressing sleep apnea, weight loss, etc.  Acute kidney injury: Resolved.  Marijuana use: Patient states that he consumes a bowl of marijuana for management of his leg jerking. Educated him on the importance of complete cessation as discussed above  Sleep apnea not on device therapy: Educated him on the importance of being reevaluated and considering device therapy.   Total encounter time 76 minutes. *Total Encounter Time as defined by the Centers for Medicare and Medicaid Services includes, in addition to the face-to-face time of a patient visit (documented in the note above) non-face-to-face time: obtaining and reviewing outside history, ordering and reviewing medications, tests or procedures, care coordination (communications with other health care professionals or caregivers)  and documentation in the medical record.  Patient's questions and concerns were addressed to his satisfaction. He voices understanding of the instructions provided during this encounter.   This note was created using a voice recognition software as a result there may be grammatical errors inadvertently enclosed that do not reflect the nature of this encounter. Every attempt is made to correct such errors.  Delilah Shan [facility]  Pager:  [phone] Office: [phone] 07/10/2022, 2:21 PM  ADDENDUM: Patient already left the ED prior to conveying him the echo results.  I  called him on his cell phone and results reviewed with him in detail.  His questions and concerns were addressed.  Patient voiced understanding and follow-up instructions with Dr. Jacinto Halim reiterated.  Delilah Shan [facility]  Pager:  [phone] Office: [phone] 3:59 PM 07/10/22

## 2022-07-10 NOTE — Subjective & Objective (Addendum)
CC: syncope/collapse HPI: 67 year old male history obesity, chronic A-fib, OSA not using CPAP, presents to the ER today with 2 syncopal episodes while at home.  Reportedly the patient was on his lawnmower and had a syncopal episode.  When paramedics arrived, he had another syncopal episode.  Blood pressure was noted to be about 60 systolic.  Given 1.5 L of normal saline.  Reportedly the patient's syncopal episodes were sudden without any prodrome.  Patient's family reports patient had been working outside all day and thinks that he was dehydrated.  Patient is currently asleep and snoring very loudly.  He resisted waking up for interview.  Family has since left the ER.  Vital signs on arrival temp 97.9 heart rate 71 blood pressure 90/62 satting 90% on room air.  Weight 120.2 kg.  Labs: UDS positive for THC  UA positive for specific gravity 1.040 otherwise negative. D-dimer slightly elevated at 0.97  Sodium 143, potassium 3.8, bicarb 24, BUN 27, creatinine 1.34  White count 10.4, hemoglobin 12.1, platelets of 220  CT angio was negative for PE.  EKG my interpretation shows rate controlled A-fib heart rate 75.  [facility] hospitalist contacted for admission.

## 2022-07-10 NOTE — Assessment & Plan Note (Addendum)
Chronic. Rate controlled without any medications. CHAD-vasc score of 0. Not on systemic anticoagulation. Keep serum K >4 and Mag >2.0.

## 2022-07-10 NOTE — ED Notes (Addendum)
This RN came to pts room; found pt standing all monitors off, and fully dressed. Pt is determined to leave the facility and go home. Pt encouraged to stay and receive full treatment. Pt states "I need a few mins to think." Pt sitting on side of the bed at this time, still refusing all interventions and monitoring.     Dr.Chen made aware

## 2022-07-10 NOTE — ED Notes (Signed)
This RN came to y

## 2022-07-10 NOTE — Discharge Summary (Signed)
Physician Discharge Summary  Craig Morgan:[id number] DOB: [birth year] DOA: 07/09/2022  PCP: Patient, No Pcp Per  Admit date: 07/09/2022 Discharge date: 07/10/2022    Admitted From: Home Disposition: Home  Recommendations for Outpatient Follow-up:  Follow up with PCP in 1-2 weeks Please obtain BMP/CBC in one week Please follow up with your PCP on the following pending results: Unresulted Labs (From admission, onward)     Start     Ordered   07/10/22 1252  Brain natriuretic peptide  Add-on,   AD        07/10/22 1251   07/10/22 0220  HIV Antibody (routine testing w rflx)  (HIV Antibody (Routine testing w reflex) panel)  Add-on,   AD        07/10/22 0219              Home Health: None Equipment/Devices: None  Discharge Condition: Stable CODE STATUS: Full code Diet recommendation: Cardiac  Subjective: Seen and examined this morning.  He had no complaints.  He was fully alert and oriented.  No dizziness.  Eager to go home.  Brief/Interim Summary: 67 year old male history obesity, chronic A-fib, OSA not using CPAP, presented to the ER with 2 syncopal episodes while at home.  Reportedly the patient was on his lawnmower and had a syncopal episode.  When paramedics arrived, he had another syncopal episode.  Blood pressure was noted to be about 60 systolic.  Given 1.5 L of normal saline.  Reportedly the patient's syncopal episodes were sudden without any prodrome.  Patient's family reports patient had been working outside all day and thinks that he was dehydrated. Vital signs on arrival temp 97.9 heart rate 71 blood pressure 90/62 satting 90% on room air.  UDS positive for THC.  UA negative for infection.  CT angio negative for PE.  EKG showed atrial fibrillation.  Patient was admitted under hospital service for further workup of syncope.  Based on his history, it was thought that the source of his syncope was likely orthostatic hypotension in the face of dehydration due to working  excessively in the hot day outside.  He also had mild AKI.  Patient was started on IV fluids.  Monitor on telemetry without any arrhythmia noted other than atrial fibrillation which is known.  AKI resolved with IV fluids, patient remained stable.  He was seen by cardiology.  Repeat echo was done.  It did not show any acute pathology.  Cardiology cleared patient for discharge.  Cardiology will follow-up as outpatient.  Discharge plan was discussed with patient and/or family member and they verbalized understanding and agreed with it.  Discharge Diagnoses:  Principal Problem:   Syncope and collapse Active Problems:   OSA on CPAP   Persistent atrial fibrillation (HCC)   Obesity (BMI 30-39.9)   Acute kidney injury   Prerenal azotemia   Elevated d-dimer   Marijuana use   Class 1 obesity due to excess calories with serious comorbidity and body mass index (BMI) of 34.0 to 34.9 in adult   Atrial fibrillation with controlled ventricular rate   Syncope    Discharge Instructions   Allergies as of 07/10/2022       Reactions   Penicillins Hives   Aleve [naproxen] Other (See Comments)   Tarry stools when taking OTC Aleve twice daily. Pt currently taking once daily with no side effects.        Medication List     TAKE these medications    aspirin EC 81 MG  tablet Take 81 mg by mouth at bedtime.   B COMPLEX PO Take 1 capsule by mouth at bedtime.   CAYENNE PO Take 1 tablet by mouth daily.   MAGNESIUM PO Take 1 tablet by mouth daily.   naproxen sodium 220 MG tablet Commonly known as: ALEVE Take 220 mg by mouth daily.   OVER THE COUNTER MEDICATION Take 1 tablet by mouth at bedtime. [place] supplement   OVER THE COUNTER MEDICATION Take 1 tablet by mouth at bedtime. Tumeric/Black pepper/Ginger supplement   POTASSIUM PO Take 1 tablet by mouth at bedtime.   rOPINIRole 4 MG tablet Commonly known as: REQUIP TAKE 1 TABLET BY MOUTH THREE TIMES A DAY AS NEEDED What changed:   how much to take when to take this   VITAMIN K2-VITAMIN D3 PO Take 1 tablet by mouth daily.   ZINC PO Take 1 tablet by mouth daily.        Follow-up Information     pcp Follow up in 1 week(s).          Yates Decamp, MD Follow up on 07/18/2022.   Specialty: Cardiology Why: 945am  TOC visit for Afib and syncope follow up Contact information: [address] [place] [address] [phone]                Allergies  Allergen Reactions   Penicillins Hives   Aleve [Naproxen] Other (See Comments)    Tarry stools when taking OTC Aleve twice daily. Pt currently taking once daily with no side effects.    Consultations: Cardiology   Procedures/Studies: ECHOCARDIOGRAM COMPLETE  Result Date: 07/10/2022    ECHOCARDIOGRAM REPORT   Patient Name:   SANDOR ARBOLEDA Date of Exam: 07/10/2022 Medical Rec #:  [id number]       Height:       74.0 in Accession #:    [id number]      Weight:       265.0 lb Date of Birth:  October 26, 1955       BSA:          2.450 m Patient Age:    66 years        BP:           140/89 mmHg Patient Gender: M               HR:           80 bpm. Exam Location:  Inpatient Procedure: 2D Echo, Cardiac Doppler and Color Doppler Indications:    Syncope                 Atrial Fibrillation  History:        Patient has no prior history of Echocardiogram examinations.                 Arrythmias:Atrial Fibrillation; Signs/Symptoms:Syncope.  Sonographer:    Wallie Char Referring Phys: [phone] ERIC CHEN  Sonographer Comments: Patient is obese and suboptimal subcostal window. IMPRESSIONS  1. Left ventricular ejection fraction, by estimation, is 55 to 60%. The left ventricle has normal function. The left ventricle has no regional wall motion abnormalities. There is mild left ventricular hypertrophy. Left ventricular diastolic function could not be evaluated.  2. Right ventricular systolic function is normal. The right ventricular size is normal.  3. Left atrial size was  mildly dilated.  4. The mitral valve is degenerative. Mild mitral valve regurgitation. No evidence of mitral stenosis.  5. The aortic valve was not  well visualized. Aortic valve regurgitation is not visualized. No aortic stenosis is present.  6. There is upper limit of normal dilatation of the aortic root, measuring 37 mm. There is mild dilatation of the ascending aorta, measuring 39 mm. Comparison(s): Prior study 05/22/2017: LVEF 55%, LAE mild, aortic root 3.9cm, see report for additional details. FINDINGS  Left Ventricle: Left ventricular ejection fraction, by estimation, is 55 to 60%. The left ventricle has normal function. The left ventricle has no regional wall motion abnormalities. The left ventricular internal cavity size was normal in size. There is  mild left ventricular hypertrophy. Left ventricular diastolic function could not be evaluated due to atrial fibrillation. Left ventricular diastolic function could not be evaluated. Right Ventricle: The right ventricular size is normal. No increase in right ventricular wall thickness. Right ventricular systolic function is normal. Left Atrium: Left atrial size was mildly dilated. Right Atrium: Right atrial size was normal in size. Pericardium: There is no evidence of pericardial effusion. Mitral Valve: The mitral valve is degenerative in appearance. Mild mitral annular calcification. Mild mitral valve regurgitation. No evidence of mitral valve stenosis. MV peak gradient, 4.2 mmHg. The mean mitral valve gradient is 1.0 mmHg. Tricuspid Valve: The tricuspid valve is grossly normal. Tricuspid valve regurgitation is trivial. No evidence of tricuspid stenosis. Aortic Valve: The aortic valve was not well visualized. Aortic valve regurgitation is not visualized. No aortic stenosis is present. Aortic valve mean gradient measures 3.0 mmHg. Aortic valve peak gradient measures 4.6 mmHg. Aortic valve area, by VTI measures 3.78 cm. Pulmonic Valve: The pulmonic valve was  normal in structure. Pulmonic valve regurgitation is not visualized. No evidence of pulmonic stenosis. Aorta: There is upper limit of normal dilatation of the aortic root, measuring 37 mm. There is mild dilatation of the ascending aorta, measuring 39 mm. IAS/Shunts: The interatrial septum was not well visualized.  LEFT VENTRICLE PLAX 2D LVIDd:         5.40 cm      Diastology LVIDs:         3.90 cm      LV e' medial:    14.50 cm/s LV PW:         1.10 cm      LV E/e' medial:  6.8 LV IVS:        1.26 cm      LV e' lateral:   14.50 cm/s LVOT diam:     2.30 cm      LV E/e' lateral: 6.8 LV SV:         76 LV SV Index:   31 LVOT Area:     4.15 cm  LV Volumes (MOD) LV vol d, MOD A2C: 85.0 ml LV vol d, MOD A4C: 125.0 ml LV vol s, MOD A2C: 34.9 ml LV vol s, MOD A4C: 55.5 ml LV SV MOD A2C:     50.1 ml LV SV MOD A4C:     125.0 ml LV SV MOD BP:      60.7 ml RIGHT VENTRICLE RV S prime:     18.60 cm/s TAPSE (M-mode): 2.3 cm LEFT ATRIUM             Index        RIGHT ATRIUM           Index LA diam:        4.50 cm 1.84 cm/m   RA Area:     25.60 cm LA Vol (A2C):   95.3 ml 38.90 ml/m  RA Volume:  75.60 ml  30.86 ml/m LA Vol (A4C):   98.4 ml 40.17 ml/m LA Biplane Vol: 97.2 ml 39.68 ml/m  AORTIC VALVE AV Area (Vmax):    3.66 cm AV Area (Vmean):   3.55 cm AV Area (VTI):     3.78 cm AV Vmax:           106.85 cm/s AV Vmean:          81.150 cm/s AV VTI:            0.200 m AV Peak Grad:      4.6 mmHg AV Mean Grad:      3.0 mmHg LVOT Vmax:         94.20 cm/s LVOT Vmean:        69.400 cm/s LVOT VTI:          0.182 m LVOT/AV VTI ratio: 0.91  AORTA Ao Root diam: 3.70 cm Ao Asc diam:  3.90 cm MITRAL VALVE               TRICUSPID VALVE MV Area (PHT): 4.21 cm    TR Peak grad:   12.4 mmHg MV Area VTI:   3.83 cm    TR Vmax:        176.00 cm/s MV Peak grad:  4.2 mmHg MV Mean grad:  1.0 mmHg    SHUNTS MV Vmax:       1.02 m/s    Systemic VTI:  0.18 m MV Vmean:      50.0 cm/s   Systemic Diam: 2.30 cm MV Decel Time: 180 msec MV E velocity:  98.70 cm/s MV A velocity: 41.10 cm/s MV E/A ratio:  2.40 Sunit Tolia DO Electronically signed by Tessa Lerner DO Signature Date/Time: 07/10/2022/3:55:41 PM    Final    CT Angio Chest PE W and/or Wo Contrast  Result Date: 07/09/2022 CLINICAL DATA:  Syncope, hypotension, evaluate for PE EXAM: CT ANGIOGRAPHY CHEST WITH CONTRAST TECHNIQUE: Multidetector CT imaging of the chest was performed using the standard protocol during bolus administration of intravenous contrast. Multiplanar CT image reconstructions and MIPs were obtained to evaluate the vascular anatomy. RADIATION DOSE REDUCTION: This exam was performed according to the departmental dose-optimization program which includes automated exposure control, adjustment of the mA and/or kV according to patient size and/or use of iterative reconstruction technique. CONTRAST:  75mL OMNIPAQUE IOHEXOL 350 MG/ML SOLN COMPARISON:  None Available. FINDINGS: Cardiovascular: Satisfactory opacification of the bilateral pulmonary arteries to the segmental level. No evidence of pulmonary embolism. Although not tailored for evaluation of the thoracic aorta, there is no evidence of thoracic aortic aneurysm or dissection. The heart is top-normal in size.  No pericardial effusion. Moderate coronary atherosclerosis of the LAD and left circumflex. Mediastinum/Nodes: No suspicious mediastinal lymphadenopathy. Visualized thyroid is unremarkable. Lungs/Pleura: Mild dependent atelectasis in the bilateral upper and lower lobes. No focal consolidation. No suspicious pulmonary nodules. No pleural effusion or pneumothorax. Upper Abdomen: Visualized upper abdomen is notable for an 18 mm cyst in the medial right liver (series 5/image 158), benign. Musculoskeletal: Mild degenerative changes of the visualized thoracolumbar spine. Review of the MIP images confirms the above findings. IMPRESSION: No evidence of pulmonary embolism. Negative CT chest. Electronically Signed   By: Charline Bills M.D.    On: 07/09/2022 21:26     Discharge Exam: Vitals:   07/10/22 1345 07/10/22 1430  BP: 115/65 (!) 140/89  Pulse: 80 85  Resp: 20 14  Temp:    SpO2: 100% 100%   Vitals:  07/10/22 1315 07/10/22 1330 07/10/22 1345 07/10/22 1430  BP: 128/83 (!) 144/99 115/65 (!) 140/89  Pulse: 82 77 80 85  Resp: Temp:      TempSrc:      SpO2: 98% 100% 100% 100%  Weight:      Height:        General: Pt is alert, awake, not in acute distress Cardiovascular: Irregularly irregular RR, S1/S2 +, no rubs, no gallops Respiratory: CTA bilaterally, no wheezing, no rhonchi Abdominal: Soft, NT, ND, bowel sounds + Extremities: no edema, no cyanosis    The results of significant diagnostics from this hospitalization (including imaging, microbiology, ancillary and laboratory) are listed below for reference.     Microbiology: No results found for this or any previous visit (from the past 240 hour(s)).   Labs: BNP (last 3 results) No results for input(s): "BNP" in the last 8760 hours. Basic Metabolic Panel: Recent Labs  Lab 07/09/22 1810 07/10/22 0342  NA 143 141  K 3.8 4.0  CL 108 108  CO2 24 23  GLUCOSE 112* 102*  BUN 27* 24*  CREATININE 1.34* 0.99  CALCIUM 8.9 8.7*  MG  --  2.0   Liver Function Tests: Recent Labs  Lab 07/09/22 1810 07/10/22 0342  AST 38 39  ALT 31 33  ALKPHOS 71 72  BILITOT 0.8 1.1  PROT 6.3* 6.4*  ALBUMIN 3.7 3.7   No results for input(s): "LIPASE", "AMYLASE" in the last 168 hours. No results for input(s): "AMMONIA" in the last 168 hours. CBC: Recent Labs  Lab 07/09/22 1810  WBC 10.4  HGB 12.1*  HCT 36.9*  MCV 99.5  PLT 220   Cardiac Enzymes: No results for input(s): "CKTOTAL", "CKMB", "CKMBINDEX", "TROPONINI" in the last 168 hours. BNP: Invalid input(s): "POCBNP" CBG: No results for input(s): "GLUCAP" in the last 168 hours. D-Dimer Recent Labs    07/09/22 1941  DDIMER 0.97*   Hgb A1c No results for input(s): "HGBA1C" in the last  72 hours. Lipid Profile No results for input(s): "CHOL", "HDL", "LDLCALC", "TRIG", "CHOLHDL", "LDLDIRECT" in the last 72 hours. Thyroid function studies Recent Labs    07/10/22 0342  TSH 1.628   Anemia work up No results for input(s): "VITAMINB12", "FOLATE", "FERRITIN", "TIBC", "IRON", "RETICCTPCT" in the last 72 hours. Urinalysis    Component Value Date/Time   COLORURINE YELLOW 07/09/2022 2213   APPEARANCEUR HAZY (A) 07/09/2022 2213   LABSPEC 1.040 (H) 07/09/2022 2213   PHURINE 5.0 07/09/2022 2213   GLUCOSEU NEGATIVE 07/09/2022 2213   HGBUR NEGATIVE 07/09/2022 2213   BILIRUBINUR NEGATIVE 07/09/2022 2213   KETONESUR NEGATIVE 07/09/2022 2213   PROTEINUR NEGATIVE 07/09/2022 2213   NITRITE NEGATIVE 07/09/2022 2213   LEUKOCYTESUR NEGATIVE 07/09/2022 2213   Sepsis Labs Recent Labs  Lab 07/09/22 1810  WBC 10.4   Microbiology No results found for this or any previous visit (from the past 240 hour(s)).   Time coordinating discharge: Over 30 minutes  SIGNED:   Hughie Closs, MD  [facility] 07/10/2022, 4:08 PM *Please note that this is a verbal dictation therefore any spelling or grammatical errors are due to the "[employer] One" system interpretation. If 7PM-7AM, please contact night-coverage [email or web address]

## 2022-07-10 NOTE — H&P (Signed)
History and Physical    Craig Morgan ZOX:[id number] DOB: [birth year] DOA: 07/09/2022  DOS: the patient was seen and examined on 07/09/2022  PCP: Patient, No Pcp Per   Patient coming from: Home  I have personally briefly reviewed patient's old medical records in [place] [facility]  CC: syncope/collapse HPI: 67 year old male history obesity, chronic A-fib, OSA not using CPAP, presents to the ER today with 2 syncopal episodes while at home.  Reportedly the patient was on his lawnmower and had a syncopal episode.  When paramedics arrived, he had another syncopal episode.  Blood pressure was noted to be about 60 systolic.  Given 1.5 L of normal saline.  Reportedly the patient's syncopal episodes were sudden without any prodrome.  Patient's family reports patient had been working outside all day and thinks that he was dehydrated.  Patient is currently asleep and snoring very loudly.  He resisted waking up for interview.  Family has since left the ER.  Vital signs on arrival temp 97.9 heart rate 71 blood pressure 90/62 satting 90% on room air.  Weight 120.2 kg.  Labs: UDS positive for THC  UA positive for specific gravity 1.040 otherwise negative. D-dimer slightly elevated at 0.97  Sodium 143, potassium 3.8, bicarb 24, BUN 27, creatinine 1.34  White count 10.4, hemoglobin 12.1, platelets of 220  CT angio was negative for PE.  EKG my interpretation shows rate controlled A-fib heart rate 75.  [facility] hospital[facility] contacted for admission.   ED Course: EKG afib. Scr 1.34  Review of Systems:  Review of Systems  Unable to perform ROS: Other  Pt declined to speak to me, would rather sleep  Past Medical History:  Diagnosis Date   Atrial flutter 2012   Back pain    DDD (degenerative disc disease), lumbosacral    Back   Dysrhythmia    history of A FIb   Family history of adverse reaction to anesthesia    Mother - PONV   GERD (gastroesophageal reflux disease)    Numbness    RIGHT  LEG - KNEE TO ANKLE - STATES HE WAS GIVEN INJECTION ONCE FOR SCIATIC PROBLEM AND THE NUMBNESS BEGAN AFTER THE INJECTION.   Persistent atrial fibrillation    Sciatica    Sleep apnea    intolerant to CPAP (03/04/22)    Past Surgical History:  Procedure Laterality Date   ADENOIDECTOMY     APPENDECTOMY  1965   CARDIOVERSION  02/04/2012   Procedure: CARDIOVERSION;  Surgeon: Pamella Pert, MD;  Location: [facility] ENDOSCOPY;  Service: Cardiovascular;  Laterality: N/A;   CARDIOVERSION N/A 05/16/2015   Procedure: CARDIOVERSION;  Surgeon: Yates Decamp, MD;  Location: [facility] ENDOSCOPY;  Service: Cardiovascular;  Laterality: N/A;   CARDIOVERSION N/A 06/05/2017   Procedure: CARDIOVERSION;  Surgeon: Elder Negus, MD;  Location: [facility] ENDOSCOPY;  Service: Cardiovascular;  Laterality: N/A;   CARPAL TUNNEL RELEASE Left 09/24/2017   Procedure: CARPAL TUNNEL RELEASE ENDOSCOPIC;  Surgeon: Christena Flake, MD;  Location: [facility] SURGERY [facility];  Service: Orthopedics;  Laterality: Left;  sleep apnea   CARPAL TUNNEL RELEASE Right 10/29/2017   Procedure: ENDOSCOPIC CARPAL TUNNEL RELEASE WRIST;  Surgeon: Christena Flake, MD;  Location: [facility] SURGERY [facility];  Service: Orthopedics;  Laterality: Right;  sleep apnea   EYE SURGERY     LASIK EYE SURG X 2 LEFT EYE AND ONCE ON RT EYE   HEMORROIDECTOMY     HERNIA REPAIR     LEFT INGUINAL HERNIA;  2ND SURGERY TO DO REVISION  LEFT INGUINAL HERNIA AND REPAIR RT INGUINAL HERNIA   INGUINAL HERNIA REPAIR Left 12/08/2012   Procedure: open left inguinal  EXPLORATION;  Surgeon: Valarie Merino, MD;  Location: [facility] ORS;  Service: General;  Laterality: Left;   SHOULDER ARTHROSCOPY WITH OPEN ROTATOR CUFF REPAIR Left 09/14/2018   Procedure: LEFT SHOULDER ARTHROSCOPY WITH MINIE  OPEN ROTATOR CUFF REPAIR, BICEPS TENODESIS, DISTAL CLAVICLE EXCISION, SUBACROMIAL DECOMPRESSION, SLEEP APNEA;  Surgeon: Signa Kell, MD;  Location: [facility] ORS;  Service: Orthopedics;  Laterality: Left;   TONSILLECTOMY        reports that he quit smoking about 26 years ago. His smoking use included cigarettes. He has a 5.00 pack-year smoking history. His smokeless tobacco use includes snuff. He reports current alcohol use of about 1.0 standard drink of alcohol per week. He reports that he does not use drugs.  Allergies  Allergen Reactions   Penicillins Hives   Aleve [Naproxen] Other (See Comments)    Tarry stools when taking OTC Aleve twice daily. Pt currently taking once daily with no side effects.    Family History  Problem Relation Age of Onset   Heart disease Father    Heart attack Father    COPD Mother    Heart attack Mother    Cancer Maternal Grandfather        lung    Prior to Admission medications   Medication Sig Start Date End Date Taking? Authorizing Provider  aspirin EC 81 MG tablet Take 81 mg by mouth at bedtime.   Yes [provider]  B Complex Vitamins (B COMPLEX PO) Take 1 capsule by mouth at bedtime.   Yes [provider]  Capsicum, Cayenne, (CAYENNE PO) Take 1 tablet by mouth daily.   Yes [provider]  MAGNESIUM PO Take 1 tablet by mouth daily.   Yes [provider]  Multiple Vitamins-Minerals (ZINC PO) Take 1 tablet by mouth daily.   Yes [provider]  naproxen sodium (ALEVE) 220 MG tablet Take 220 mg by mouth daily.   Yes [provider]  OVER THE COUNTER MEDICATION Take 1 tablet by mouth at bedtime. Lion's Mane supplement   Yes [provider]  OVER THE COUNTER MEDICATION Take 1 tablet by mouth at bedtime. Tumeric/Black pepper/Ginger supplement   Yes [provider]  POTASSIUM PO Take 1 tablet by mouth at bedtime.   Yes [provider]  rOPINIRole (REQUIP) 4 MG tablet TAKE 1 TABLET BY MOUTH THREE TIMES A DAY AS NEEDED Patient taking differently: Take 12-16 mg by mouth at bedtime. 04/30/21  Yes Yates Decamp, MD  Vitamin D-Vitamin K (VITAMIN K2-VITAMIN D3 PO) Take 1 tablet by mouth daily.   Yes  [provider]  dexamethasone (DECADRON) 1 MG tablet 2 tablets twice daily for 2 days, one tablet twice daily for 2 days, one tablet daily for 2 days. Start on Sunday 2/17 Patient not taking: Reported on 07/09/2022 03/09/22   Barnett Abu, MD  methocarbamol (ROBAXIN) 500 MG tablet Take 1 tablet (500 mg total) by mouth every 6 (six) hours as needed for muscle spasms. Patient not taking: Reported on 07/09/2022 03/09/22   Barnett Abu, MD  oxyCODONE-acetaminophen (PERCOCET/ROXICET) 5-325 MG tablet Take 1-2 tablets by mouth every 4 (four) hours as needed for moderate pain or severe pain. Patient not taking: Reported on 07/09/2022 03/09/22   Barnett Abu, MD    Physical Exam: Vitals:   07/10/22 0108 07/10/22 0109 07/10/22 0110 07/10/22 0111  BP:  Pulse: 71 70 79 68  Resp: Temp:      TempSrc:      SpO2: 97% 98% 97% 92%  Weight:      Height:        Physical Exam Vitals and nursing note reviewed.  Constitutional:      General: He is not in acute distress.    Appearance: He is obese. He is not toxic-appearing.  HENT:     Head: Normocephalic and atraumatic.     Nose: Nose normal.  Cardiovascular:     Rate and Rhythm: Normal rate. Rhythm irregular.     Pulses: Normal pulses.  Pulmonary:     Effort: Pulmonary effort is normal.     Breath sounds: Normal breath sounds.  Abdominal:     General: Bowel sounds are normal. There is no distension.     Palpations: Abdomen is soft.  Musculoskeletal:     Comments: Mild +1 pitting LE edema bilaterally  Skin:    General: Skin is warm and dry.     Capillary Refill: Capillary refill takes less than 2 seconds.      Labs on Admission: I have personally reviewed following labs and imaging studies  CBC: Recent Labs  Lab 07/09/22 1810  WBC 10.4  HGB 12.1*  HCT 36.9*  MCV 99.5  PLT 220   Basic Metabolic Panel: Recent Labs  Lab 07/09/22 1810  NA 143  K 3.8  CL 108  CO2 24  GLUCOSE 112*  BUN 27*   CREATININE 1.34*  CALCIUM 8.9   GFR: Estimated Creatinine Clearance: 76.7 mL/min (A) (by C-G formula based on SCr of 1.34 mg/dL (H)). Liver Function Tests: Recent Labs  Lab 07/09/22 1810  AST 38  ALT 31  ALKPHOS 71  BILITOT 0.8  PROT 6.3*  ALBUMIN 3.7   Cardiac Enzymes: Recent Labs  Lab 07/09/22 1810 07/09/22 2213  TROPONINIHS 8 6   Urine analysis:    Component Value Date/Time   COLORURINE YELLOW 07/09/2022 2213   APPEARANCEUR HAZY (A) 07/09/2022 2213   LABSPEC 1.040 (H) 07/09/2022 2213   PHURINE 5.0 07/09/2022 2213   GLUCOSEU NEGATIVE 07/09/2022 2213   HGBUR NEGATIVE 07/09/2022 2213   BILIRUBINUR NEGATIVE 07/09/2022 2213   KETONESUR NEGATIVE 07/09/2022 2213   PROTEINUR NEGATIVE 07/09/2022 2213   NITRITE NEGATIVE 07/09/2022 2213   LEUKOCYTESUR NEGATIVE 07/09/2022 2213    Radiological Exams on Admission: I have personally reviewed images CT Angio Chest PE W and/or Wo Contrast  Result Date: 07/09/2022 CLINICAL DATA:  Syncope, hypotension, evaluate for PE EXAM: CT ANGIOGRAPHY CHEST WITH CONTRAST TECHNIQUE: Multidetector CT imaging of the chest was performed using the standard protocol during bolus administration of intravenous contrast. Multiplanar CT image reconstructions and MIPs were obtained to evaluate the vascular anatomy. RADIATION DOSE REDUCTION: This exam was performed according to the departmental dose-optimization program which includes automated exposure control, adjustment of the mA and/or kV according to patient size and/or use of iterative reconstruction technique. CONTRAST:  75mL OMNIPAQUE IOHEXOL 350 MG/ML SOLN COMPARISON:  None Available. FINDINGS: Cardiovascular: Satisfactory opacification of the bilateral pulmonary arteries to the segmental level. No evidence of pulmonary embolism. Although not tailored for evaluation of the thoracic aorta, there is no evidence of thoracic aortic aneurysm or dissection. The heart is top-normal in size.  No pericardial  effusion. Moderate coronary atherosclerosis of the LAD and left circumflex. Mediastinum/Nodes: No suspicious mediastinal lymphadenopathy. Visualized thyroid is unremarkable. Lungs/Pleura: Mild dependent atelectasis in the bilateral upper and  lower lobes. No focal consolidation. No suspicious pulmonary nodules. No pleural effusion or pneumothorax. Upper Abdomen: Visualized upper abdomen is notable for an 18 mm cyst in the medial right liver (series 5/image 158), benign. Musculoskeletal: Mild degenerative changes of the visualized thoracolumbar spine. Review of the MIP images confirms the above findings. IMPRESSION: No evidence of pulmonary embolism. Negative CT chest. Electronically Signed   By: Charline Bills M.D.   On: 07/09/2022 21:26    EKG: My personal interpretation of EKG shows: afib, HR 75    Assessment/Plan Principal Problem:   Syncope and collapse Active Problems:   OSA on CPAP   Persistent atrial fibrillation (HCC)   Obesity (BMI 30-39.9)   AKI (acute kidney injury)    Assessment and Plan: * Syncope and collapse Observation telemetry bed. Check echo. Cards consult(pt of Dr. Jacinto Halim. Sent secure chat to Dr. Odis Hollingshead) to consider ziopatch vs loop recorder. Per cardiology note from 01-2022, pt not on systemic AC due to low CHAD-VASC score.  Persistent atrial fibrillation (HCC) Chronic. Rate controlled without any medications. CHAD-vasc score of 0. Not on systemic anticoagulation. Keep serum K >4 and Mag >2.0.  OSA on CPAP Pt refused to wear cpap in hospital. On prn O2.  AKI (acute kidney injury) Consistent with dehydration.  Continue IV fluids.  Could explain his syncopal episode.  However given his history of atrial fibrillation and prior history of bradycardia, he will need cardiac evaluation.  Obesity (BMI 30-39.9) Bmi 34   DVT prophylaxis: SQ Heparin Code Status: Full Code by default Family Communication: no family at bedside  Disposition Plan: return home  Consults  called: secure chat sent to Dr. Odis Hollingshead for consult.  Admission status: Observation, Telemetry bed   Carollee Herter, DO [facility] 07/10/2022, 2:10 AM

## 2022-07-10 NOTE — Assessment & Plan Note (Signed)
Bmi 34

## 2022-07-10 NOTE — ED Notes (Signed)
RT attempted to put pt on CPAP. Pt refused.

## 2022-07-10 NOTE — Assessment & Plan Note (Signed)
Observation telemetry bed. Check echo. Cards consult(pt of Dr. Jacinto Halim. Sent secure chat to Dr. Odis Hollingshead) to consider ziopatch vs loop recorder. Per cardiology note from 01-2022, pt not on systemic AC due to low CHAD-VASC score.

## 2022-07-11 NOTE — Telephone Encounter (Signed)
Location of hospitalization: [facility] Reason for hospitalization: Syncope Date of discharge: 07/10/2022 Date of first communication with patient: today Person contacting patient: Me Current symptoms: None Do you understand why you were in the Hospital: Yes Questions regarding discharge instructions: None Where were you discharged to: Home Medications reviewed: Yes Allergies reviewed: Yes Dietary changes reviewed: Yes. Discussed low fat and low salt diet.  Referals reviewed: NA Activities of Daily Living: Able to with mild limitations Any transportation issues/concerns: None Any patient concerns: None Confirmed importance & date/time of Follow up appt: Yes Confirmed with patient if condition begins to worsen call. Pt was given the office number and encouraged to call back with questions or concerns: Yes

## 2022-07-18 ENCOUNTER — Ambulatory Visit: Payer: Medicaid Other | Admitting: Cardiology

## 2022-07-18 NOTE — Progress Notes (Deleted)
Primary Physician/Referring:  Patient, No Pcp Per  Patient ID: Craig Morgan, male    DOB: [birth year], 67 y.o.   MRN: [id number]  No chief complaint on file.  HPI:    HPI: Craig Morgan  is a 67 y.o. Caucasian male on failed tikosyn for asymptomatic Afib, now atrial fibrillation deemed permanent, OSA not on therapy, chronic leg edema due to venous insufficiency and obesity presents here for preop evaluation, I had not seen him for last 3 years.  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 Now scheduled for back surgery.   Past Medical History:  Diagnosis Date   Atrial flutter 2012   Back pain    DDD (degenerative disc disease), lumbosacral    Back   Dysrhythmia    history of A FIb   Family history of adverse reaction to anesthesia    Mother - PONV   GERD (gastroesophageal reflux disease)    Numbness    RIGHT LEG - KNEE TO ANKLE - STATES HE WAS GIVEN INJECTION ONCE FOR SCIATIC PROBLEM AND THE NUMBNESS BEGAN AFTER THE INJECTION.   Persistent atrial fibrillation    Sciatica    Sleep apnea    intolerant to CPAP (03/04/22)   Past Surgical History:  Procedure Laterality Date   ADENOIDECTOMY     APPENDECTOMY  1965   CARDIOVERSION  02/04/2012   Procedure: CARDIOVERSION;  Surgeon: Pamella Pert, MD;  Location: [facility] ENDOSCOPY;  Service: Cardiovascular;  Laterality: N/A;   CARDIOVERSION N/A 05/16/2015   Procedure: CARDIOVERSION;  Surgeon: Yates Decamp, MD;  Location: [facility] ENDOSCOPY;  Service: Cardiovascular;  Laterality: N/A;   CARDIOVERSION N/A 06/05/2017   Procedure: CARDIOVERSION;  Surgeon: Elder Negus, MD;  Location: [facility] ENDOSCOPY;  Service: Cardiovascular;  Laterality: N/A;   CARPAL TUNNEL RELEASE Left 09/24/2017   Procedure: CARPAL TUNNEL RELEASE ENDOSCOPIC;  Surgeon: Christena Flake, MD;  Location: [facility] SURGERY CNTR;  Service: Orthopedics;  Laterality: Left;  sleep apnea   CARPAL TUNNEL RELEASE Right 10/29/2017    Procedure: ENDOSCOPIC CARPAL TUNNEL RELEASE WRIST;  Surgeon: Christena Flake, MD;  Location: [facility] SURGERY CNTR;  Service: Orthopedics;  Laterality: Right;  sleep apnea   EYE SURGERY     LASIK EYE SURG X 2 LEFT EYE AND ONCE ON RT EYE   HEMORROIDECTOMY     HERNIA REPAIR     LEFT INGUINAL HERNIA;  2ND SURGERY TO DO REVISION LEFT INGUINAL HERNIA AND REPAIR RT INGUINAL HERNIA   INGUINAL HERNIA REPAIR Left 12/08/2012   Procedure: open left inguinal  EXPLORATION;  Surgeon: Valarie Merino, MD;  Location: [facility] ORS;  Service: General;  Laterality: Left;   SHOULDER ARTHROSCOPY WITH OPEN ROTATOR CUFF REPAIR Left 09/14/2018   Procedure: LEFT SHOULDER ARTHROSCOPY WITH MINIE  OPEN ROTATOR CUFF REPAIR, BICEPS TENODESIS, DISTAL CLAVICLE EXCISION, SUBACROMIAL DECOMPRESSION, SLEEP APNEA;  Surgeon: Signa Kell, MD;  Location: [facility] ORS;  Service: Orthopedics;  Laterality: Left;   TONSILLECTOMY      Social History   Tobacco Use   Smoking status: Former    Packs/day: 1.00    Years: 5.00    Additional pack years: 0.00    Total pack years: 5.00    Types: Cigarettes    Quit date: 1998    Years since quitting: 26.3   Smokeless tobacco: Current    Types: Snuff   Tobacco comments:    Uses dips occasionally.  Substance Use Topics   Alcohol use: Yes  Alcohol/week: 1.0 standard drink of alcohol    Types: 1 Cans of beer per week    Marital Status: Single   ROS  Review of Systems  Cardiovascular:  Negative for chest pain, dyspnea on exertion and leg swelling.  Respiratory:  Positive for snoring.    Objective  There were no vitals taken for this visit. There is no height or weight on file to calculate BMI.     07/10/2022    2:30 PM 07/10/2022    1:45 PM 07/10/2022    1:30 PM  Vitals with BMI  Systolic 140 115 540  Diastolic 89 65 99  Pulse 85 80 77    Physical Exam Constitutional:      Appearance: He is well-developed. He is obese.     Comments: Moderately obese  Neck:     Thyroid: No  thyromegaly.     Vascular: No JVD.  Cardiovascular:     Rate and Rhythm: Normal rate. Rhythm irregularly irregular.     Pulses: Intact distal pulses.          Carotid pulses are 2+ on the right side and 2+ on the left side.      Popliteal pulses are 2+ on the right side and 1+ on the left side.       Dorsalis pedis pulses are 0 on the right side and 2+ on the left side.       Posterior tibial pulses are 2+ on the right side and 0 on the left side.     Heart sounds: No murmur heard.    No gallop.     Comments: S1 is variable, S2 is normal. 2-3+ pitting leg edema below knee. Pulmonary:     Effort: Pulmonary effort is normal.     Breath sounds: Normal breath sounds.  Abdominal:     General: Bowel sounds are normal.     Palpations: Abdomen is soft.    Radiology:   Laboratory examination:      Latest Ref Rng & Units 07/10/2022    3:42 AM 07/09/2022    6:10 PM 03/04/2022    1:12 PM  CMP  Glucose 70 - 99 mg/dL 981  191  478   BUN 8 - 23 mg/dL Creatinine 0.61 - 1.24 mg/dL 2.95  6.21  3.08   Sodium 135 - 145 mmol/L 141  143  140   Potassium 3.5 - 5.1 mmol/L 4.0  3.8  3.8   Chloride 98 - 111 mmol/L 108  108  107   CO2 22 - 32 mmol/L Calcium 8.9 - 10.3 mg/dL 8.7  8.9  9.1   Total Protein 6.5 - 8.1 g/dL 6.4  6.3    Total Bilirubin 0.3 - 1.2 mg/dL 1.1  0.8    Alkaline Phos 38 - 126 U/L 72  71    AST 15 - 41 U/L 39  38    ALT 0 - 44 U/L 33  31        Latest Ref Rng & Units 07/09/2022    6:10 PM 03/04/2022    1:12 PM 06/04/2017    8:26 AM  CBC  WBC 4.0 - 10.5 K/uL 10.4  6.8  5.7   Hemoglobin 13.0 - 17.0 g/dL 65.7  84.6  96.2   Hematocrit 39.0 - 52.0 % 36.9  39.8  42.3   Platelets 150 - 400 K/uL 220  236  209  Lipid Panel     Component Value Date/Time   CHOL 126 11/05/2018 0934   TRIG 98 11/05/2018 0934   HDL 34 (L) 11/05/2018 0934   CHOLHDL 4.3 06/17/2011 1000   VLDL 21 06/17/2011 1000   LDLCALC 72 11/05/2018 0934   HEMOGLOBIN A1C Lab  Results  Component Value Date   HGBA1C 5.5 11/05/2018   TSH Recent Labs    07/10/22 0342  TSH 1.628   Cardiac Studies:   Lexiscan myoview 5.31.12 Diaphragmatic attenuation withour ischemia. Normal LVEF.    Echocardiogram 07/10/2022:   1. Left ventricular ejection fraction, by estimation, is 55 to 60%. The left ventricle has normal function. The left ventricle has no regional wall motion abnormalities. There is mild left ventricular hypertrophy. Left ventricular diastolic function  could not be evaluated.  2. Right ventricular systolic function is normal. The right ventricular size is normal.  3. Left atrial size was mildly dilated.  4. The mitral valve is degenerative. Mild mitral valve regurgitation. No evidence of mitral stenosis.  5. The aortic valve was not well visualized. Aortic valve regurgitation is not visualized. No aortic stenosis is present.  6. There is upper limit of normal dilatation of the aortic root, measuring 37 mm. There is mild dilatation of the ascending aorta, measuring 39 mm.   Comparison(s): Prior study 05/22/2017: LVEF 55%, LAE mild, aortic root 3.9cm, see report for additional details.  *** EKG 01/25/2022: Atrial fibrillation with controlled ventricular response at the rate of 81 bpm, leftward axis.  No evidence of ischemia.  Compared to 09/09/2019, no change.   Allergy & Medications   Allergies  Allergen Reactions   Penicillins Hives   Aleve [Naproxen] Other (See Comments)    Tarry stools when taking OTC Aleve twice daily. Pt currently taking once daily with no side effects.    Current Outpatient Medications:    aspirin EC 81 MG tablet, Take 81 mg by mouth at bedtime., Disp: , Rfl:    B Complex Vitamins (B COMPLEX PO), Take 1 capsule by mouth at bedtime., Disp: , Rfl:    Capsicum, Cayenne, (CAYENNE PO), Take 1 tablet by mouth daily., Disp: , Rfl:    MAGNESIUM PO, Take 1 tablet by mouth daily., Disp: , Rfl:    Multiple Vitamins-Minerals (ZINC PO),  Take 1 tablet by mouth daily., Disp: , Rfl:    naproxen sodium (ALEVE) 220 MG tablet, Take 220 mg by mouth daily., Disp: , Rfl:    OVER THE COUNTER MEDICATION, Take 1 tablet by mouth at bedtime. Lion's Mane supplement, Disp: , Rfl:    OVER THE COUNTER MEDICATION, Take 1 tablet by mouth at bedtime. Tumeric/Black pepper/Ginger supplement, Disp: , Rfl:    POTASSIUM PO, Take 1 tablet by mouth at bedtime., Disp: , Rfl:    rOPINIRole (REQUIP) 4 MG tablet, TAKE 1 TABLET BY MOUTH THREE TIMES A DAY AS NEEDED (Patient taking differently: Take 12-16 mg by mouth at bedtime.), Disp: 270 tablet, Rfl: 1   Vitamin D-Vitamin K (VITAMIN K2-VITAMIN D3 PO), Take 1 tablet by mouth daily., Disp: , Rfl:    Assessment     ICD-10-CM   1. Heat syncope, subsequent encounter  T67.1XXD     2. Permanent atrial fibrillation  I48.21      CHA2DS2-VASc Score is <1.  Yearly risk of stroke: <1.3% (None).  Score of 1=1.3; 2=2.2; 3=3.2; 4=4; 5=6.7; 6=9.8; 7=>9.8) -(CHF; HTN; vasc disease DM,  Male = 1; Age <65 =0; 65-74 = 1,  >75 =2; stroke =  2).    Recommendations:   Craig Morgan  is a 67 y.o.  Caucasian male on failed tikosyn for asymptomatic Afib, now atrial fibrillation deemed permanent, OSA not on therapy, chronic leg edema due to venous insufficiency and obesity presents here for post hospital/ED visi\t follow up.  *** 1. Pre-op evaluation Patient has severe back pain, sciatica and left near foot drop and needs relatively urgent back surgery.  From cardiac standpoint in the absence of tobacco use, diabetes mellitus, hypertension, can be taken up for the upcoming surgery with low risk.  However would like to obtain an echocardiogram to evaluate his LVEF prior to the surgery.  2. Aortic root dilatation (HCC) Echocardiogram ordered and I will follow-up with mild aortic root dilatation.  3. Permanent atrial fibrillation (HCC) No indication for anticoagulation as his CHA2DS2-VASc score is low.  Patient does have  severe sleep apnea but does not want to use CPAP as he could not tolerate this.  Again reinforced weight loss and consideration for reevaluation of sleep apnea.  4. 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  I will see him back in 6 months for follow-up unless echocardiogram is abnormal.  I spent 40 minutes with the patient in reviewing his old records again, try to obtain his labs, he has not seen a physician in quite a while.  Previously he is renal function, CBC and lipids have been normal.  He is scheduled for preop lab soon and I will continue to follow-up on this.    Yates Decamp, MD, [facility] 07/18/2022, 7:24 AM Office: [phone] Fax: [phone] Pager: [phone]

## 2022-07-21 NOTE — Progress Notes (Unsigned)
Primary Physician/Referring:  Patient, No Pcp Per  Patient ID: Craig Morgan, male    DOB: [birth year], 67 y.o.   MRN: [id number]  No chief complaint on file.  HPI:    HPI: Craig Morgan  is a 67 y.o. Caucasian male on failed tikosyn for asymptomatic Afib, now atrial fibrillation deemed permanent, OSA not on therapy, chronic leg edema due to venous insufficiency and obesity presents here for preop evaluation, I had not seen him for last 3 years.  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 Now scheduled for back surgery.   Past Medical History:  Diagnosis Date   Atrial flutter (HCC) 2012   Back pain    DDD (degenerative disc disease), lumbosacral    Back   Dysrhythmia    history of A FIb   Family history of adverse reaction to anesthesia    Mother - PONV   GERD (gastroesophageal reflux disease)    Numbness    RIGHT LEG - KNEE TO ANKLE - STATES HE WAS GIVEN INJECTION ONCE FOR SCIATIC PROBLEM AND THE NUMBNESS BEGAN AFTER THE INJECTION.   Persistent atrial fibrillation (HCC)    Sciatica    Sleep apnea    intolerant to CPAP (03/04/22)   Past Surgical History:  Procedure Laterality Date   ADENOIDECTOMY     APPENDECTOMY  1965   CARDIOVERSION  02/04/2012   Procedure: CARDIOVERSION;  Surgeon: Pamella Pert, MD;  Location: [facility] ENDOSCOPY;  Service: Cardiovascular;  Laterality: N/A;   CARDIOVERSION N/A 05/16/2015   Procedure: CARDIOVERSION;  Surgeon: Yates Decamp, MD;  Location: [facility] ENDOSCOPY;  Service: Cardiovascular;  Laterality: N/A;   CARDIOVERSION N/A 06/05/2017   Procedure: CARDIOVERSION;  Surgeon: Elder Negus, MD;  Location: [facility] ENDOSCOPY;  Service: Cardiovascular;  Laterality: N/A;   CARPAL TUNNEL RELEASE Left 09/24/2017   Procedure: CARPAL TUNNEL RELEASE ENDOSCOPIC;  Surgeon: Christena Flake, MD;  Location: [facility];  Service: Orthopedics;  Laterality: Left;  sleep apnea   CARPAL TUNNEL RELEASE Right 10/29/2017    Procedure: ENDOSCOPIC CARPAL TUNNEL RELEASE WRIST;  Surgeon: Christena Flake, MD;  Location: [facility];  Service: Orthopedics;  Laterality: Right;  sleep apnea   EYE SURGERY     LASIK EYE SURG X 2 LEFT EYE AND ONCE ON RT EYE   HEMORROIDECTOMY     HERNIA REPAIR     LEFT INGUINAL HERNIA;  2ND SURGERY TO DO REVISION LEFT INGUINAL HERNIA AND REPAIR RT INGUINAL HERNIA   INGUINAL HERNIA REPAIR Left 12/08/2012   Procedure: open left inguinal  EXPLORATION;  Surgeon: Valarie Merino, MD;  Location: [facility] ORS;  Service: General;  Laterality: Left;   SHOULDER ARTHROSCOPY WITH OPEN ROTATOR CUFF REPAIR Left 09/14/2018   Procedure: LEFT SHOULDER ARTHROSCOPY WITH MINIE  OPEN ROTATOR CUFF REPAIR, BICEPS TENODESIS, DISTAL CLAVICLE EXCISION, SUBACROMIAL DECOMPRESSION, SLEEP APNEA;  Surgeon: Signa Kell, MD;  Location: [facility] ORS;  Service: Orthopedics;  Laterality: Left;   TONSILLECTOMY      Social History   Tobacco Use   Smoking status: Former    Packs/day: 1.00    Years: 5.00    Additional pack years: 0.00    Total pack years: 5.00    Types: Cigarettes    Quit date: 1998    Years since quitting: 26.3   Smokeless tobacco: Current    Types: Snuff   Tobacco comments:    Uses dips occasionally.  Substance Use Topics   Alcohol  use: Yes    Alcohol/week: 1.0 standard drink of alcohol    Types: 1 Cans of beer per week    Marital Status: Single   ROS  Review of Systems  Cardiovascular:  Negative for chest pain, dyspnea on exertion and leg swelling.  Respiratory:  Positive for snoring.    Objective  There were no vitals taken for this visit. There is no height or weight on file to calculate BMI.     07/10/2022    2:30 PM 07/10/2022    1:45 PM 07/10/2022    1:30 PM  Vitals with BMI  Systolic 140 115 161  Diastolic 89 65 99  Pulse 85 80 77    Physical Exam Constitutional:      Appearance: He is well-developed. He is obese.     Comments: Moderately obese  Neck:     Thyroid: No  thyromegaly.     Vascular: No JVD.  Cardiovascular:     Rate and Rhythm: Normal rate. Rhythm irregularly irregular.     Pulses: Intact distal pulses.          Carotid pulses are 2+ on the right side and 2+ on the left side.      Popliteal pulses are 2+ on the right side and 1+ on the left side.       Dorsalis pedis pulses are 0 on the right side and 2+ on the left side.       Posterior tibial pulses are 2+ on the right side and 0 on the left side.     Heart sounds: No murmur heard.    No gallop.     Comments: S1 is variable, S2 is normal. 2-3+ pitting leg edema below knee. Pulmonary:     Effort: Pulmonary effort is normal.     Breath sounds: Normal breath sounds.  Abdominal:     General: Bowel sounds are normal.     Palpations: Abdomen is soft.    Radiology:   Laboratory examination:      Latest Ref Rng & Units 07/10/2022    3:42 AM 07/09/2022    6:10 PM 03/04/2022    1:12 PM  CMP  Glucose 70 - 99 mg/dL 096  045  409   BUN 8 - 23 mg/dL 24  27  16    Creatinine 0.61 - 1.24 mg/dL 8.11  9.14  7.82   Sodium 135 - 145 mmol/L 141  143  140   Potassium 3.5 - 5.1 mmol/L 4.0  3.8  3.8   Chloride 98 - 111 mmol/L 108  108  107   CO2 22 - 32 mmol/L 23  24  25    Calcium 8.9 - 10.3 mg/dL 8.7  8.9  9.1   Total Protein 6.5 - 8.1 g/dL 6.4  6.3    Total Bilirubin 0.3 - 1.2 mg/dL 1.1  0.8    Alkaline Phos 38 - 126 U/L 72  71    AST 15 - 41 U/L 39  38    ALT 0 - 44 U/L 33  31        Latest Ref Rng & Units 07/09/2022    6:10 PM 03/04/2022    1:12 PM 06/04/2017    8:26 AM  CBC  WBC 4.0 - 10.5 K/uL 10.4  6.8  5.7   Hemoglobin 13.0 - 17.0 g/dL 95.6  21.3  08.6   Hematocrit 39.0 - 52.0 % 36.9  39.8  42.3   Platelets 150 - 400 K/uL 220  236  209    Lipid Panel     Component Value Date/Time   CHOL 126 11/05/2018 0934   TRIG 98 11/05/2018 0934   HDL 34 (L) 11/05/2018 0934   CHOLHDL 4.3 06/17/2011 1000   VLDL 21 06/17/2011 1000   LDLCALC 72 11/05/2018 0934   HEMOGLOBIN A1C Lab  Results  Component Value Date   HGBA1C 5.5 11/05/2018   TSH Recent Labs    07/10/22 0342  TSH 1.628    Medications   Current Outpatient Medications  Medication Instructions   aspirin EC 81 mg, Oral, Daily at bedtime   B Complex Vitamins (B COMPLEX PO) 1 capsule, Oral, Daily at bedtime   Capsicum, Cayenne, (CAYENNE PO) 1 tablet, Oral, Daily   MAGNESIUM PO 1 tablet, Oral, Daily   Multiple Vitamins-Minerals (ZINC PO) 1 tablet, Oral, Daily   naproxen sodium (ALEVE) 220 mg, Oral, Daily   OVER THE COUNTER MEDICATION 1 tablet, Oral, Daily at bedtime, [place] supplement   OVER THE COUNTER MEDICATION 1 tablet, Oral, Daily at bedtime, Tumeric/Black pepper/Ginger supplement   POTASSIUM PO 1 tablet, Oral, Daily at bedtime   rOPINIRole (REQUIP) 4 MG tablet TAKE 1 TABLET BY MOUTH THREE TIMES A DAY AS NEEDED   Vitamin D-Vitamin K (VITAMIN K2-VITAMIN D3 PO) 1 tablet, Oral, Daily    Cardiac Studies:   Lexiscan myoview 5.31.12 Diaphragmatic attenuation withour ischemia. Normal LVEF.    Echocardiogram 05/22/2017: Left ventricle cavity is normal in size. Mild concentric hypertrophy of the left ventricle. Normal global wall motion. Unable to evaluate diastolic function due to A. Fibrillation. Calculated EF 55%. Left atrial cavity is mildly dilated. Inadequate tricuspid regurgitation jet to estimate pulmonary artery pressure. Normal right atrial pressure. Mildly dilated aortic root measuring 3.9 cm. No significant change compared to previous study dated 05/03/2015.  EKG 01/25/2022: Atrial fibrillation with controlled ventricular response at the rate of 81 bpm, leftward axis.  No evidence of ischemia.  Compared to 09/09/2019, no change.  Assessment   No diagnosis found.  CHA2DS2-VASc Score is <1.  Yearly risk of stroke: <1.3% (None).  Score of 1=1.3; 2=2.2; 3=3.2; 4=4; 5=6.7; 6=9.8; 7=>9.8) -(CHF; HTN; vasc disease DM,  Male = 1; Age <65 =0; 65-74 = 1,  >75 =2; stroke = 2).     Recommendations:   Craig Morgan  is a 67 y.o.  Caucasian male on failed tikosyn for asymptomatic Afib, now atrial fibrillation deemed permanent, OSA not on therapy, chronic leg edema due to venous insufficiency and obesity presents here for preop evaluation, I had not seen him for last 3 years.  1. Pre-op evaluation Patient has severe back pain, sciatica and left near foot drop and needs relatively urgent back surgery.  From cardiac standpoint in the absence of tobacco use, diabetes mellitus, hypertension, can be taken up for the upcoming surgery with low risk.  However would like to obtain an echocardiogram to evaluate his LVEF prior to the surgery.  2. Aortic root dilatation (HCC) Echocardiogram ordered and I will follow-up with mild aortic root dilatation.  3. Permanent atrial fibrillation (HCC) No indication for anticoagulation as his CHA2DS2-VASc score is low.  Patient does have severe sleep apnea but does not want to use CPAP as he could not tolerate this.  Again reinforced weight loss and consideration for reevaluation of sleep apnea.  4. 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  I will see him back in 6 months for  follow-up unless echocardiogram is abnormal.  I spent 40 minutes with the patient in reviewing his old records again, try to obtain his labs, he has not seen a physician in quite a while.  Previously he is renal function, CBC and lipids have been normal.  He is scheduled for preop lab soon and I will continue to follow-up on this.    Yates Decamp, MD, [facility] 07/21/2022, 11:48 PM Office: [phone] Fax: [phone] Pager: [phone]

## 2022-07-22 ENCOUNTER — Ambulatory Visit: Payer: Medicaid Other | Admitting: Cardiology

## 2022-07-22 ENCOUNTER — Encounter: Payer: Self-pay | Admitting: Cardiology

## 2022-07-22 VITALS — BP 124/79 | HR 95 | Resp 16 | Ht 74.0 in | Wt 282.6 lb

## 2022-07-22 DIAGNOSIS — I77810 Thoracic aortic ectasia: Secondary | ICD-10-CM

## 2022-07-22 DIAGNOSIS — G4733 Obstructive sleep apnea (adult) (pediatric): Secondary | ICD-10-CM

## 2022-07-22 DIAGNOSIS — I4821 Permanent atrial fibrillation: Secondary | ICD-10-CM

## 2022-07-22 DIAGNOSIS — T671XXD Heat syncope, subsequent encounter: Secondary | ICD-10-CM

## 2022-07-31 ENCOUNTER — Ambulatory Visit: Payer: Medicare Other | Admitting: Cardiology

## 2022-08-05 ENCOUNTER — Other Ambulatory Visit: Payer: Self-pay | Admitting: Neurological Surgery

## 2022-08-05 DIAGNOSIS — I739 Peripheral vascular disease, unspecified: Secondary | ICD-10-CM

## 2022-09-05 ENCOUNTER — Other Ambulatory Visit: Payer: Medicare Other

## 2022-10-16 ENCOUNTER — Other Ambulatory Visit: Payer: Medicare Other

## 2023-02-13 NOTE — Progress Notes (Signed)
Cardiology Office Note:  .   Date:  02/14/2023  ID:  CALIB WEGLEY, DOB 1955-07-02, MRN [id number] PCP: Patient, No Pcp Per  [facility] Providers Cardiologist:  Dr. Jacinto Halim   History of Present Illness: .   Craig Morgan is a 67 y.o. male with a past medical history of permanent atrial fibrillation. OSA not on therapy, chronic leg edema due to venous insufficiency, obseity. Patient followed by Dr. Jacinto Halim and presents today for evaluation of fatigue.   Per chart review, patient was first diagnosed with atrial fibrillation in 2013 and underwent DCCV in 01/2012. Again had symptomatic atrial fibrillation in 04/2015. Underwent echocardiogram in 04/2015 that showed EF 50%. Underwent DCCV 05/16/15, but did not convert to NSR. Was referred to EP and saw Dr. Johney Frame 06/12/15. Was admitted for tikosyn loading in 05/2017 and successfully converted to sinus rhythm.   He was seen in clinic by Dr. Jacinto Halim in 10/2018 and was back in atrial fibrillation. He continued to be in atrial fibrillation 3 months later, and tikosyn was discontinued. He had chronic fatigue, but was otherwise asymptomatic when in afib. Later seen by Dr. Jacinto Halim in 01/2022. Patient was fairly disabled due to chronic back pain and spinal stenosis. Underwent echocardiogram on 01/30/22 that showed EF 55-60%, moderate LVH, severely dilated LA, mild MR.   Patient was admitted in 06/2022 after a syncopal episode. He reported that he might have been dehydrated at the time. Underwent echocardiogram 07/10/22 that showed EF 55-60%, no regional wall motion abnormalities, mild LVH, normal RV function, mild MR. It was suspected that his syncope was due to orthostasis, dehydration. EKG showed rate controlled afib. hsTn negative x2.   Today, patient presents concerned that he has been feeling more tired than usual. Denies specific symptoms, but feels more "run down" than usual. Denies cough, nasal congestion. He has significant neuropathy, but denies worsening  pain in his lower extremities. Denies chest pain, shortness of breath. He eats home cooked meals, which his daughter prepares for him. Feels like he follows a pretty healthy and low sodium diet. In 06/2022, he was admitted to the hospital and was told he was dehydrated. Since then, he has been doing a good job of drinking water and occasionally adds electrolytes to his water. Denies syncope, near syncope. Sometimes his feet feel cold. Denies claudication or muscle cramps. Patient has permanent atrial fibrillation and occasionally can feel fluttering in his chest   ROS: as per HPI  Studies Reviewed: .    Cardiac Studies & Procedures     STRESS TESTS  MYOCARDIAL PERFUSION IMAGING 08/23/2010   ECHOCARDIOGRAM  ECHOCARDIOGRAM COMPLETE 07/10/2022  Narrative ECHOCARDIOGRAM REPORT    Patient Name:   Craig Morgan Date of Exam: 07/10/2022 Medical Rec #:  [id number]       Height:       74.0 in Accession #:    [id number]      Weight:       265.0 lb Date of Birth:  15-Feb-1956       BSA:          2.450 m Patient Age:    66 years        BP:           140/89 mmHg Patient Gender: M               HR:           80 bpm. Exam Location:  Inpatient  Procedure: 2D Echo, Cardiac Doppler and  Color Doppler  Indications:    Syncope Atrial Fibrillation  History:        Patient has no prior history of Echocardiogram examinations. Arrythmias:Atrial Fibrillation; Signs/Symptoms:Syncope.  Sonographer:    Wallie Char Referring Phys: [phone] ERIC CHEN   Sonographer Comments: Patient is obese and suboptimal subcostal window. IMPRESSIONS   1. Left ventricular ejection fraction, by estimation, is 55 to 60%. The left ventricle has normal function. The left ventricle has no regional wall motion abnormalities. There is mild left ventricular hypertrophy. Left ventricular diastolic function could not be evaluated. 2. Right ventricular systolic function is normal. The right ventricular size is normal. 3. Left  atrial size was mildly dilated. 4. The mitral valve is degenerative. Mild mitral valve regurgitation. No evidence of mitral stenosis. 5. The aortic valve was not well visualized. Aortic valve regurgitation is not visualized. No aortic stenosis is present. 6. There is upper limit of normal dilatation of the aortic root, measuring 37 mm. There is mild dilatation of the ascending aorta, measuring 39 mm.  Comparison(s): Prior study 05/22/2017: LVEF 55%, LAE mild, aortic root 3.9cm, see report for additional details.  FINDINGS Left Ventricle: Left ventricular ejection fraction, by estimation, is 55 to 60%. The left ventricle has normal function. The left ventricle has no regional wall motion abnormalities. The left ventricular internal cavity size was normal in size. There is mild left ventricular hypertrophy. Left ventricular diastolic function could not be evaluated due to atrial fibrillation. Left ventricular diastolic function could not be evaluated.  Right Ventricle: The right ventricular size is normal. No increase in right ventricular wall thickness. Right ventricular systolic function is normal.  Left Atrium: Left atrial size was mildly dilated.  Right Atrium: Right atrial size was normal in size.  Pericardium: There is no evidence of pericardial effusion.  Mitral Valve: The mitral valve is degenerative in appearance. Mild mitral annular calcification. Mild mitral valve regurgitation. No evidence of mitral valve stenosis. MV peak gradient, 4.2 mmHg. The mean mitral valve gradient is 1.0 mmHg.  Tricuspid Valve: The tricuspid valve is grossly normal. Tricuspid valve regurgitation is trivial. No evidence of tricuspid stenosis.  Aortic Valve: The aortic valve was not well visualized. Aortic valve regurgitation is not visualized. No aortic stenosis is present. Aortic valve mean gradient measures 3.0 mmHg. Aortic valve peak gradient measures 4.6 mmHg. Aortic valve area, by VTI measures 3.78  cm.  Pulmonic Valve: The pulmonic valve was normal in structure. Pulmonic valve regurgitation is not visualized. No evidence of pulmonic stenosis.  Aorta: There is upper limit of normal dilatation of the aortic root, measuring 37 mm. There is mild dilatation of the ascending aorta, measuring 39 mm.  IAS/Shunts: The interatrial septum was not well visualized.   LEFT VENTRICLE PLAX 2D LVIDd:         5.40 cm      Diastology LVIDs:         3.90 cm      LV e' medial:    14.50 cm/s LV PW:         1.10 cm      LV E/e' medial:  6.8 LV IVS:        1.26 cm      LV e' lateral:   14.50 cm/s LVOT diam:     2.30 cm      LV E/e' lateral: 6.8 LV SV:         76 LV SV Index:   31 LVOT Area:     4.15 cm  LV Volumes (MOD) LV vol d, MOD A2C: 85.0 ml LV vol d, MOD A4C: 125.0 ml LV vol s, MOD A2C: 34.9 ml LV vol s, MOD A4C: 55.5 ml LV SV MOD A2C:     50.1 ml LV SV MOD A4C:     125.0 ml LV SV MOD BP:      60.7 ml  RIGHT VENTRICLE RV S prime:     18.60 cm/s TAPSE (M-mode): 2.3 cm  LEFT ATRIUM             Index        RIGHT ATRIUM           Index LA diam:        4.50 cm 1.84 cm/m   RA Area:     25.60 cm LA Vol (A2C):   95.3 ml 38.90 ml/m  RA Volume:   75.60 ml  30.86 ml/m LA Vol (A4C):   98.4 ml 40.17 ml/m LA Biplane Vol: 97.2 ml 39.68 ml/m AORTIC VALVE AV Area (Vmax):    3.66 cm AV Area (Vmean):   3.55 cm AV Area (VTI):     3.78 cm AV Vmax:           106.85 cm/s AV Vmean:          81.150 cm/s AV VTI:            0.200 m AV Peak Grad:      4.6 mmHg AV Mean Grad:      3.0 mmHg LVOT Vmax:         94.20 cm/s LVOT Vmean:        69.400 cm/s LVOT VTI:          0.182 m LVOT/AV VTI ratio: 0.91  AORTA Ao Root diam: 3.70 cm Ao Asc diam:  3.90 cm  MITRAL VALVE               TRICUSPID VALVE MV Area (PHT): 4.21 cm    TR Peak grad:   12.4 mmHg MV Area VTI:   3.83 cm    TR Vmax:        176.00 cm/s MV Peak grad:  4.2 mmHg MV Mean grad:  1.0 mmHg    SHUNTS MV Vmax:       1.02 m/s     Systemic VTI:  0.18 m MV Vmean:      50.0 cm/s   Systemic Diam: 2.30 cm MV Decel Time: 180 msec MV E velocity: 98.70 cm/s MV A velocity: 41.10 cm/s MV E/A ratio:  2.40  Sunit Tolia DO Electronically signed by Tessa Lerner DO Signature Date/Time: 07/10/2022/3:55:41 PM    Final              Risk Assessment/Calculations:    CHA2DS2-VASc Score = 1   This indicates a 0.6% annual risk of stroke. The patient's score is based upon: CHF History: 0 HTN History: 0 Diabetes History: 0 Stroke History: 0 Vascular Disease History: 0 Age Score: 1 Gender Score: 0             Physical Exam:   VS:  BP 126/62 (BP Location: Left Arm, Patient Position: Sitting, Cuff Size: Normal)   Pulse 74   Ht 6\' 1"  (1.854 m)   Wt 280 lb (127 kg)   SpO2 96%   BMI 36.94 kg/m    Wt Readings from Last 3 Encounters:  02/14/23 280 lb (127 kg)  07/22/22 282 lb 9.6 oz (128.2 kg)  07/09/22 265 lb (  120.2 kg)    GEN: Well nourished, well developed in no acute distress. Sitting comfortably on the exam table  NECK: No JVD CARDIAC: Irregular rate and rhythm.  no murmurs, rubs, gallops. Radial pulses 2+ bilaterally  RESPIRATORY:  Clear to auscultation without rales, wheezing or rhonchi. Normal work of breathing on room air  ABDOMEN: Soft, non-tender, non-distended EXTREMITIES:  No edema in BLE; No deformity   ASSESSMENT AND PLAN: .    Fatigue  - Patient reports feeling more "run down" than usual. Denies signs of infection such as cough, nasal congestion, fever, chills, body aches, nausea, vomiting  - Does not follow with a PCP  - Patient has atrial fibrillation- likely permanent at this point. Was taken off tikosyn in 2020 and has likely been in afib since that time. HR is well controlled. Possible that afib is contributing to his chronic fatigue - Ordered TSH, free T4, BMP, CBC - Recommended establishing care with a PCP for further work up  - Patient was diagnosed with OSA over 10 years ago. Does not wear  CPAP. Discussed importance of CPAP compliance and placed referral to Dr. Tresa Endo for OSA management   Permanent Atrial Fibrillation  - Patient first diagnosed with afib in 2013. Tried tikosyn in 05/2017, maintained NSR until 10/2018. He has chronic fatigue, but has otherwise been asymptomatic in afib - Rate controlled without AV nodal medications. HR in the 70s today  - CHADS-VASc 1. Currently on ASA with low CHADS-VASC  - Suspect that patient has been in afib since 10/2018. Unlikely he would maintain NSR if started on antiarrhythmics or cardioverted. With his worsening fatigue, I will reach out to Dr. Jacinto Halim to see if patient would get any benefit from an EP referral   Aortic Rood Dilatation  - Most recent echocardiogram from 06/2022 showed upper limit of normal dilatation of the aortic root, measuring 37 mm and mild dilatation of the ascending aorta,  measuring 39 mm. - No indication for intervention at this time  - Anticipate echocardiogram for monitoring in 06/2023   OSA  - Patient was diagnosed with OSA over 10 years ago. Does not follow with a sleep doctor. Does not wear his CPAP because he does not notice improvement in fatigue when he wears it - Discussed importance of CPAP compliance.  - Referred to Dr. Tresa Endo for OSA management   Dispo: Follow up with Dr. Jacinto Halim in 04/2023 as scheduled   Signed, Jonita Albee, PA-C

## 2023-02-14 ENCOUNTER — Encounter: Payer: Self-pay | Admitting: Cardiology

## 2023-02-14 ENCOUNTER — Ambulatory Visit: Payer: Medicare HMO | Attending: Cardiology | Admitting: Cardiology

## 2023-02-14 VITALS — BP 126/62 | HR 74 | Ht 73.0 in | Wt 280.0 lb

## 2023-02-14 DIAGNOSIS — R5383 Other fatigue: Secondary | ICD-10-CM | POA: Diagnosis not present

## 2023-02-14 DIAGNOSIS — I77810 Thoracic aortic ectasia: Secondary | ICD-10-CM | POA: Diagnosis not present

## 2023-02-14 DIAGNOSIS — G4733 Obstructive sleep apnea (adult) (pediatric): Secondary | ICD-10-CM

## 2023-02-14 DIAGNOSIS — I4821 Permanent atrial fibrillation: Secondary | ICD-10-CM

## 2023-02-14 NOTE — Patient Instructions (Signed)
Medication Instructions:  No changes *If you need a refill on your cardiac medications before your next appointment, please call your pharmacy*   Lab Work: We will draw a CBC, Free T4, BMP, TSH If you have labs (blood work) drawn today and your tests are completely normal, you will receive your results only by: MyChart Message (if you have MyChart) OR A paper copy in the mail If you have any lab test that is abnormal or we need to change your treatment, we will call you to review the results.   Testing/Procedures: No testing   Follow-Up: At [facility], you and your health needs are our priority.  As part of our continuing mission to provide you with exceptional heart care, we have created designated Provider Care Teams.  These Care Teams include your primary Cardiologist (physician) and Advanced Practice Providers (APPs -  Physician Assistants and Nurse Practitioners) who all work together to provide you with the care you need, when you need it.  We recommend signing up for the patient portal called "MyChart".  Sign up information is provided on this After Visit Summary.  MyChart is used to connect with patients for Virtual Visits (Telemedicine).  Patients are able to view lab/test results, encounter notes, upcoming appointments, etc.  Non-urgent messages can be sent to your provider as well.   To learn more about what you can do with MyChart, go to [email or web address].    Your next appointment:   February 26 at 10:20 am with Yates Decamp, MD

## 2023-02-15 LAB — BASIC METABOLIC PANEL
BUN/Creatinine Ratio: 16 (ref 10–24)
BUN: 15 mg/dL (ref 8–27)
CO2: 25 mmol/L (ref 20–29)
Calcium: 9.5 mg/dL (ref 8.6–10.2)
Chloride: 105 mmol/L (ref 96–106)
Creatinine, Ser: 0.96 mg/dL (ref 0.76–1.27)
Glucose: 107 mg/dL — ABNORMAL HIGH (ref 70–99)
Potassium: 4.7 mmol/L (ref 3.5–5.2)
Sodium: 143 mmol/L (ref 134–144)
eGFR: 88 mL/min/{1.73_m2} (ref >59)

## 2023-02-15 LAB — CBC
Hematocrit: 37.4 % — ABNORMAL LOW (ref 37.5–51.0)
Hemoglobin: 12.9 g/dL — ABNORMAL LOW (ref 13.0–17.7)
MCH: 33.8 pg — ABNORMAL HIGH (ref 26.6–33.0)
MCHC: 34.5 g/dL (ref 31.5–35.7)
MCV: 98 fL — ABNORMAL HIGH (ref 79–97)
Platelets: 222 10*3/uL (ref 150–450)
RBC: 3.82 x10E6/uL — ABNORMAL LOW (ref 4.14–5.80)
RDW: 12.8 % (ref 11.6–15.4)
WBC: 5.9 10*3/uL (ref 3.4–10.8)

## 2023-02-15 LAB — T4, FREE: Free T4: 1.15 ng/dL (ref 0.82–1.77)

## 2023-02-15 LAB — TSH: TSH: 2.17 u[IU]/mL (ref 0.450–4.500)

## 2023-02-24 ENCOUNTER — Encounter (HOSPITAL_COMMUNITY): Payer: Self-pay

## 2023-02-24 ENCOUNTER — Emergency Department (HOSPITAL_COMMUNITY): Payer: Medicare HMO

## 2023-02-24 ENCOUNTER — Other Ambulatory Visit: Payer: Self-pay

## 2023-02-24 ENCOUNTER — Emergency Department (HOSPITAL_COMMUNITY)
Admission: EM | Admit: 2023-02-24 | Discharge: 2023-02-24 | Disposition: A | Discharge status: Home / Self Care | Payer: Medicare HMO | Attending: Emergency Medicine | Admitting: Emergency Medicine

## 2023-02-24 ENCOUNTER — Telehealth: Payer: Self-pay | Admitting: General Practice

## 2023-02-24 DIAGNOSIS — Z7982 Long term (current) use of aspirin: Secondary | ICD-10-CM | POA: Insufficient documentation

## 2023-02-24 DIAGNOSIS — N400 Enlarged prostate without lower urinary tract symptoms: Secondary | ICD-10-CM | POA: Insufficient documentation

## 2023-02-24 DIAGNOSIS — K5900 Constipation, unspecified: Secondary | ICD-10-CM | POA: Insufficient documentation

## 2023-02-24 DIAGNOSIS — M549 Dorsalgia, unspecified: Secondary | ICD-10-CM | POA: Diagnosis not present

## 2023-02-24 DIAGNOSIS — N401 Enlarged prostate with lower urinary tract symptoms: Secondary | ICD-10-CM | POA: Diagnosis not present

## 2023-02-24 DIAGNOSIS — I10 Essential (primary) hypertension: Secondary | ICD-10-CM | POA: Diagnosis not present

## 2023-02-24 DIAGNOSIS — Z981 Arthrodesis status: Secondary | ICD-10-CM | POA: Diagnosis not present

## 2023-02-24 DIAGNOSIS — M5126 Other intervertebral disc displacement, lumbar region: Secondary | ICD-10-CM | POA: Diagnosis not present

## 2023-02-24 DIAGNOSIS — M4310 Spondylolisthesis, site unspecified: Secondary | ICD-10-CM | POA: Diagnosis not present

## 2023-02-24 DIAGNOSIS — M545 Low back pain, unspecified: Secondary | ICD-10-CM | POA: Diagnosis not present

## 2023-02-24 DIAGNOSIS — R9431 Abnormal electrocardiogram [ECG] [EKG]: Secondary | ICD-10-CM | POA: Diagnosis not present

## 2023-02-24 DIAGNOSIS — K5730 Diverticulosis of large intestine without perforation or abscess without bleeding: Secondary | ICD-10-CM | POA: Diagnosis not present

## 2023-02-24 LAB — CBC WITH DIFFERENTIAL/PLATELET
Abs Immature Granulocytes: 0.01 10*3/uL (ref 0.00–0.07)
Basophils Absolute: 0 10*3/uL (ref 0.0–0.1)
Basophils Relative: 1 %
Eosinophils Absolute: 0.1 10*3/uL (ref 0.0–0.5)
Eosinophils Relative: 1 %
HCT: 47.6 % (ref 39.0–52.0)
Hemoglobin: 16 g/dL (ref 13.0–17.0)
Immature Granulocytes: 0 %
Lymphocytes Relative: 13 %
Lymphs Abs: 0.7 10*3/uL (ref 0.7–4.0)
MCH: 33.8 pg (ref 26.0–34.0)
MCHC: 33.6 g/dL (ref 30.0–36.0)
MCV: 100.6 fL — ABNORMAL HIGH (ref 80.0–100.0)
Monocytes Absolute: 0.4 10*3/uL (ref 0.1–1.0)
Monocytes Relative: 8 %
Neutro Abs: 3.9 10*3/uL (ref 1.7–7.7)
Neutrophils Relative %: 77 %
Platelets: 195 10*3/uL (ref 150–400)
RBC: 4.73 MIL/uL (ref 4.22–5.81)
RDW: 13.7 % (ref 11.5–15.5)
WBC: 5 10*3/uL (ref 4.0–10.5)
nRBC: 0 % (ref 0.0–0.2)

## 2023-02-24 LAB — COMPREHENSIVE METABOLIC PANEL
ALT: 26 U/L (ref 0–44)
AST: 27 U/L (ref 15–41)
Albumin: 4.3 g/dL (ref 3.5–5.0)
Alkaline Phosphatase: 80 U/L (ref 38–126)
Anion gap: 4 — ABNORMAL LOW (ref 5–15)
BUN: 20 mg/dL (ref 8–23)
CO2: 25 mmol/L (ref 22–32)
Calcium: 9.2 mg/dL (ref 8.9–10.3)
Chloride: 108 mmol/L (ref 98–111)
Creatinine, Ser: 0.92 mg/dL (ref 0.61–1.24)
GFR, Estimated: >60 mL/min (ref >60)
Glucose, Bld: 107 mg/dL — ABNORMAL HIGH (ref 70–99)
Potassium: 4.2 mmol/L (ref 3.5–5.1)
Sodium: 137 mmol/L (ref 135–145)
Total Bilirubin: 0.8 mg/dL (ref <1.2)
Total Protein: 7.4 g/dL (ref 6.5–8.1)

## 2023-02-24 LAB — URINALYSIS, ROUTINE W REFLEX MICROSCOPIC
Bacteria, UA: NONE SEEN
Bilirubin Urine: NEGATIVE
Glucose, UA: NEGATIVE mg/dL
Hgb urine dipstick: NEGATIVE
Ketones, ur: NEGATIVE mg/dL
Leukocytes,Ua: NEGATIVE
Nitrite: NEGATIVE
Protein, ur: NEGATIVE mg/dL
Specific Gravity, Urine: 1.012 (ref 1.005–1.030)
pH: 5 (ref 5.0–8.0)

## 2023-02-24 LAB — LIPASE, BLOOD: Lipase: 40 U/L (ref 11–51)

## 2023-02-24 MED ORDER — LACTULOSE 20 GM/30ML PO SOLN: 10.0000 g | Freq: Two times a day (BID) | ORAL | 0 refills | Status: DC | PRN

## 2023-02-24 MED ORDER — KETOROLAC TROMETHAMINE 30 MG/ML IJ SOLN
30.0000 mg | Freq: Once | INTRAMUSCULAR | Status: AC
  Administered 2023-02-24: 30 mg via INTRAVENOUS
  Filled 2023-02-24: qty 1

## 2023-02-24 MED ORDER — MORPHINE SULFATE (PF) 4 MG/ML IV SOLN
4.0000 mg | Freq: Once | INTRAVENOUS | Status: AC
  Administered 2023-02-24: 4 mg via INTRAVENOUS
  Filled 2023-02-24: qty 1

## 2023-02-24 MED ORDER — ONDANSETRON HCL 4 MG/2ML IJ SOLN
4.0000 mg | Freq: Once | INTRAMUSCULAR | Status: AC
  Administered 2023-02-24: 4 mg via INTRAVENOUS
  Filled 2023-02-24: qty 2

## 2023-02-24 MED ORDER — TAMSULOSIN HCL 0.4 MG PO CAPS: 0.4000 mg | ORAL_CAPSULE | Freq: Every day | ORAL | 0 refills | Status: AC

## 2023-02-24 MED ORDER — HYDROCODONE-ACETAMINOPHEN 5-325 MG PO TABS: 1.0000 | ORAL_TABLET | ORAL | 0 refills | Status: DC | PRN

## 2023-02-24 MED ORDER — IOHEXOL 300 MG/ML  SOLN
100.0000 mL | Freq: Once | INTRAMUSCULAR | Status: AC | PRN
  Administered 2023-02-24: 100 mL via INTRAVENOUS

## 2023-02-24 MED ORDER — DOCUSATE SODIUM 100 MG PO CAPS: 100.0000 mg | ORAL_CAPSULE | Freq: Two times a day (BID) | ORAL | 0 refills | Status: DC | PRN

## 2023-02-24 NOTE — Telephone Encounter (Signed)
Please set up when possible.  Thanks.

## 2023-02-24 NOTE — Telephone Encounter (Signed)
Patient daughter Craig Morgan called in and wanted to know if Dr. Para March would take on her dad as a new patient. She stated that she would like for him to see Dr. Para March as she is currently his patient as well. Please advise. Thank you!

## 2023-02-24 NOTE — ED Triage Notes (Signed)
Pt complaining of back pain that began last night, that increases with movement. Pt also endorsing constipation, bloating, and urinary frequency.

## 2023-02-24 NOTE — ED Provider Notes (Signed)
[place] EMERGENCY DEPARTMENT AT [facility] Provider Note   CSN: [id number] Arrival date & time: 02/24/23  1123     History  Chief Complaint  Patient presents with   Back Pain    Craig Morgan is a 67 y.o. male.  Pt is a 67 yo male with pmhx significant for persistent afib (not on thinners), GERD, DDD, and chronic back pain.  Pt presents to the ED today with worsening back pain.  He also has been having problems with constipation and urinary frequency.  He denies any trauma.       Home Medications Prior to Admission medications   Medication Sig Start Date End Date Taking? Authorizing Provider  aspirin EC 81 MG tablet Take 81 mg by mouth at bedtime.   Yes [provider]  B Complex Vitamins (B COMPLEX PO) Take 1 capsule by mouth at bedtime.   Yes [provider]  Capsicum, Cayenne, (CAYENNE PO) Take 1 tablet by mouth daily.   Yes [provider]  docusate sodium (COLACE) 100 MG capsule Take 1 capsule (100 mg total) by mouth 2 (two) times daily as needed for mild constipation. 02/24/23  Yes Jacalyn Lefevre, MD  HYDROcodone-acetaminophen (NORCO/VICODIN) 5-325 MG tablet Take 1 tablet by mouth every 4 (four) hours as needed. 02/24/23  Yes Jacalyn Lefevre, MD  Lactulose 20 GM/30ML SOLN Take 15 mLs (10 g total) by mouth 2 (two) times daily as needed (constipation). 02/24/23  Yes Jacalyn Lefevre, MD  MAGNESIUM PO Take 1 tablet by mouth daily.   Yes [provider]  Multiple Vitamins-Minerals (ZINC PO) Take 1 tablet by mouth daily.   Yes [provider]  naproxen sodium (ALEVE) 220 MG tablet Take 220 mg by mouth as needed.   Yes [provider]  POTASSIUM PO Take 1 tablet by mouth at bedtime.   Yes [provider]  rOPINIRole (REQUIP) 4 MG tablet TAKE 1 TABLET BY MOUTH THREE TIMES A DAY AS NEEDED Patient taking differently: Take 4-8 mg by mouth at bedtime as needed (leg spasms). 04/30/21  Yes Yates Decamp, MD   tamsulosin (FLOMAX) 0.4 MG CAPS capsule Take 1 capsule (0.4 mg total) by mouth daily after breakfast. 02/24/23  Yes Jacalyn Lefevre, MD  Vitamin D-Vitamin K (VITAMIN K2-VITAMIN D3 PO) Take 1 tablet by mouth daily.   Yes [provider]  OVER THE COUNTER MEDICATION Take 1 tablet by mouth at bedtime. [place] supplement Patient not taking: Reported on 02/14/2023    [provider]  OVER THE COUNTER MEDICATION Take 1 tablet by mouth at bedtime. Tumeric/Black pepper/Ginger supplement Patient not taking: Reported on 02/14/2023    [provider]      Allergies    Penicillins    Review of Systems   Review of Systems  Gastrointestinal:  Positive for constipation.       Urinary frequency  Musculoskeletal:  Positive for back pain.  All other systems reviewed and are negative.   Physical Exam Updated Vital Signs BP 117/79   Pulse 85   Temp 97.9 F (36.6 C)   Resp 20   Ht 6\' 1"  (1.854 m)   Wt 127 kg   SpO2 96%   BMI 36.94 kg/m  Physical Exam Vitals and nursing note reviewed.  Constitutional:      Appearance: Normal appearance. He is obese.  HENT:     Head: Normocephalic and atraumatic.     Right Ear: External ear normal.     Left  Ear: External ear normal.     Nose: Nose normal.     Mouth/Throat:     Mouth: Mucous membranes are moist.     Pharynx: Oropharynx is clear.  Eyes:     Extraocular Movements: Extraocular movements intact.     Conjunctiva/sclera: Conjunctivae normal.     Pupils: Pupils are equal, round, and reactive to light.  Cardiovascular:     Rate and Rhythm: Normal rate. Rhythm irregular.     Pulses: Normal pulses.     Heart sounds: Normal heart sounds.  Pulmonary:     Effort: Pulmonary effort is normal.     Breath sounds: Normal breath sounds.  Abdominal:     General: Abdomen is flat. Bowel sounds are normal.     Palpations: Abdomen is soft.     Tenderness: There is abdominal tenderness in the suprapubic area.   Musculoskeletal:        General: Normal range of motion.       Arms:     Cervical back: Normal range of motion and neck supple.  Skin:    General: Skin is warm and dry.     Capillary Refill: Capillary refill takes less than 2 seconds.     Findings: No rash.  Neurological:     General: No focal deficit present.     Mental Status: He is alert and oriented to person, place, and time.  Psychiatric:        Mood and Affect: Mood normal.        Behavior: Behavior normal.     ED Results / Procedures / Treatments   Labs (all labs ordered are listed, but only abnormal results are displayed) Labs Reviewed  CBC WITH DIFFERENTIAL/PLATELET - Abnormal; Notable for the following components:      Result Value   MCV 100.6 (*)    All other components within normal limits  COMPREHENSIVE METABOLIC PANEL - Abnormal; Notable for the following components:   Glucose, Bld 107 (*)    Anion gap 4 (*)    All other components within normal limits  LIPASE, BLOOD  URINALYSIS, ROUTINE W REFLEX MICROSCOPIC    EKG EKG Interpretation Date/Time:  Monday February 24 2023 12:07:08 EST Ventricular Rate:  76 PR Interval:    QRS Duration:  86 QT Interval:  394 QTC Calculation: 443 R Axis:   3  Text Interpretation: Atrial fibrillation No significant change since last tracing Confirmed by Jacalyn Lefevre [phone]) on 02/24/2023 1:03:25 PM  Radiology CT ABDOMEN PELVIS W CONTRAST  Result Date: 02/24/2023 CLINICAL DATA:  Back pain that began last night and increasing with movement. Constipation, bloating, urinary frequency EXAM: CT ABDOMEN AND PELVIS WITH CONTRAST TECHNIQUE: Multidetector CT imaging of the abdomen and pelvis was performed using the standard protocol following bolus administration of intravenous contrast. RADIATION DOSE REDUCTION: This exam was performed according to the departmental dose-optimization program which includes automated exposure control, adjustment of the mA and/or kV according to  patient size and/or use of iterative reconstruction technique. CONTRAST:  OMNIPAQUE IOHEXOL 300 MG/ML  SOLN COMPARISON:  CT pelvis 03/08/2010 FINDINGS: Lower chest: No acute abnormality. Hepatobiliary: Unremarkable liver, gallbladder and biliary tree. Pancreas: Unremarkable. Spleen: Unremarkable. Adrenals/Urinary Tract: Normal adrenal glands. No urinary calculi or hydronephrosis. Nondistended bladder. Stomach/Bowel: Normal caliber large and small bowel. Redundant sigmoid colon. Moderate colonic stool load. Colonic diverticulosis without diverticulitis. Stomach is within normal limits. The appendix is not visualized. No secondary signs of appendicitis. Vascular/Lymphatic: No significant vascular findings are present. No enlarged  abdominal or pelvic lymph nodes. Reproductive: Enlarged prostate. Other: No free intraperitoneal fluid or air. Musculoskeletal: No acute fracture.  Posterior fusion L3-L5. IMPRESSION: 1. No acute abnormality in the abdomen or pelvis. 2. Moderate colonic stool load. 3. Colonic diverticulosis without diverticulitis. 4. Enlarged prostate. Electronically Signed   By: Minerva Fester M.D.   On: 02/24/2023 15:41   CT L-SPINE NO CHARGE  Result Date: 02/24/2023 CLINICAL DATA:  Back pain. EXAM: CT LUMBAR SPINE WITHOUT CONTRAST TECHNIQUE: Multidetector CT imaging of the lumbar spine was performed without intravenous contrast administration. Multiplanar CT image reconstructions were also generated. RADIATION DOSE REDUCTION: This exam was performed according to the departmental dose-optimization program which includes automated exposure control, adjustment of the mA and/or kV according to patient size and/or use of iterative reconstruction technique. COMPARISON:  None Available. FINDINGS: Segmentation: 5 lumbar type vertebrae. Alignment: Trace retrolisthesis of L2 on L3. Grade 1 anterolisthesis L4 on L5. Vertebrae: No acute fracture or focal pathologic process. Postsurgical changes from L3-L5  spinal fusion and decompression. No evidence of perihardware lucency to suggest hardware complications. Paraspinal and other soft tissues: Negative. Disc levels: No CT evidence of high-grade spinal stenosis. No evidence of high-grade neural foraminal stenosis. IMPRESSION: No acute fracture or traumatic listhesis. Electronically Signed   By: Lorenza Cambridge M.D.   On: 02/24/2023 13:37    Procedures Procedures    Medications Ordered in ED Medications  ketorolac (TORADOL) 30 MG/ML injection 30 mg (has no administration in time range)  morphine (PF) 4 MG/ML injection 4 mg (4 mg Intravenous Given 02/24/23 1226)  ondansetron (ZOFRAN) injection 4 mg (4 mg Intravenous Given 02/24/23 1225)  iohexol (OMNIPAQUE) 300 MG/ML solution 100 mL (100 mLs Intravenous Contrast Given 02/24/23 1309)    ED Course/ Medical Decision Making/ A&P                                 Medical Decision Making Amount and/or Complexity of Data Reviewed Labs: ordered. Radiology: ordered.  Risk OTC drugs. Prescription drug management.   This patient presents to the ED for concern of back and abd pain, this involves an extensive number of treatment options, and is a complaint that carries with it a high risk of complications and morbidity.  The differential diagnosis includes lumbar radiculopathy, uti, constipation, sbo, kidney stone   Co morbidities that complicate the patient evaluation   afib (not on thinners), GERD, DDD, and chronic back pain   Additional history obtained:  Additional history obtained from epic chart review External records from outside source obtained and reviewed including daughter   Lab Tests:  I Ordered, and personally interpreted labs.  The pertinent results include:  cmp nl, lip nl, ua nl   Imaging Studies ordered:  I ordered imaging studies including ct abd/pelvis and ct l-spine  I independently visualized and interpreted imaging which showed  CT lumbar spine: No acute fracture or  traumatic listhesis.  CT abd/pelvis: No acute abnormality in the abdomen or pelvis.  2. Moderate colonic stool load.  3. Colonic diverticulosis without diverticulitis.  4. Enlarged prostate.   I agree with the radiologist interpretation   Cardiac Monitoring:  The patient was maintained on a cardiac monitor.  I personally viewed and interpreted the cardiac monitored which showed an underlying rhythm of: afib   Medicines ordered and prescription drug management:  I ordered medication including morphine/zofran  for sx  Reevaluation of the patient after these medicines showed  that the patient improved I have reviewed the patients home medicines and have made adjustments as needed   Test Considered:  ct   Critical Interventions:  Pain control   Problem List / ED Course:  Constipation:  pt has been on miralax without improvement in sx.  He is d/c with lactulose and colace.  His daughter will give him an enema if that does not help.  He is given a list of high fiber foods. Back pain: likely from the constipation.  No evidence of urinary retention or cauda equina.   Enlarged prostate:  pt is symptomatic, so I will put him on flomax.  Pt is to f/u with urology.     Reevaluation:  After the interventions noted above, I reevaluated the patient and found that they have :improved   Social Determinants of Health:  Lives at home   Dispostion:  After consideration of the diagnostic results and the patients response to treatment, I feel that the patent would benefit from discharge with outpatient f/u.          Final Clinical Impression(s) / ED Diagnoses Final diagnoses:  Acute left-sided low back pain without sciatica  Constipation, unspecified constipation type  Enlarged prostate    Rx / DC Orders ED Discharge Orders          Ordered    Lactulose 20 GM/30ML SOLN  2 times daily PRN        02/24/23 1601    docusate sodium (COLACE) 100 MG capsule  2 times daily  PRN        02/24/23 1601    tamsulosin (FLOMAX) 0.4 MG CAPS capsule  Daily after breakfast        02/24/23 1601    HYDROcodone-acetaminophen (NORCO/VICODIN) 5-325 MG tablet  Every 4 hours PRN        02/24/23 1601              Jacalyn Lefevre, MD 02/24/23 1604

## 2023-02-25 ENCOUNTER — Telehealth: Payer: Self-pay

## 2023-02-25 NOTE — Telephone Encounter (Signed)
Please schedule patient appointment. Thank you

## 2023-02-25 NOTE — Telephone Encounter (Signed)
Spoke with patient and scheduled an appointment with Dr. Para March.

## 2023-02-25 NOTE — Telephone Encounter (Signed)
-----   Message from Jonita Albee sent at 02/17/2023  7:34 AM EST ----- Please tell patient that his lab work showed normal WBC count, normal platelets, normal thyroid function, normal kidney function, and normal electrolytes. Hemoglobin is 12.9 and normal is 13, so it is a bit low. Overall stable since 7 months ago, unlikely to be causing his fatigue.   No changes to treatment plan, follow up with PCP   Thanks KJ

## 2023-02-25 NOTE — Telephone Encounter (Signed)
Left message to call back  

## 2023-02-26 NOTE — Telephone Encounter (Signed)
Left message to call back  

## 2023-02-28 DIAGNOSIS — I739 Peripheral vascular disease, unspecified: Secondary | ICD-10-CM | POA: Insufficient documentation

## 2023-02-28 DIAGNOSIS — M48061 Spinal stenosis, lumbar region without neurogenic claudication: Secondary | ICD-10-CM | POA: Insufficient documentation

## 2023-03-03 ENCOUNTER — Ambulatory Visit (INDEPENDENT_AMBULATORY_CARE_PROVIDER_SITE_OTHER)
Admission: RE | Admit: 2023-03-03 | Discharge: 2023-03-03 | Disposition: A | Discharge status: Home / Self Care | Payer: Medicare HMO | Source: Ambulatory Visit | Attending: Family Medicine | Admitting: Family Medicine

## 2023-03-03 ENCOUNTER — Ambulatory Visit (INDEPENDENT_AMBULATORY_CARE_PROVIDER_SITE_OTHER): Payer: Medicare HMO | Admitting: Family Medicine

## 2023-03-03 ENCOUNTER — Encounter: Payer: Self-pay | Admitting: Family Medicine

## 2023-03-03 ENCOUNTER — Telehealth: Payer: Self-pay | Admitting: Family Medicine

## 2023-03-03 VITALS — BP 124/70 | HR 70 | Temp 98.5°F | Ht 71.0 in | Wt 274.6 lb

## 2023-03-03 DIAGNOSIS — Z7189 Other specified counseling: Secondary | ICD-10-CM

## 2023-03-03 DIAGNOSIS — K5900 Constipation, unspecified: Secondary | ICD-10-CM

## 2023-03-03 DIAGNOSIS — I4819 Other persistent atrial fibrillation: Secondary | ICD-10-CM

## 2023-03-03 DIAGNOSIS — Z0389 Encounter for observation for other suspected diseases and conditions ruled out: Secondary | ICD-10-CM | POA: Diagnosis not present

## 2023-03-03 DIAGNOSIS — Z1211 Encounter for screening for malignant neoplasm of colon: Secondary | ICD-10-CM

## 2023-03-03 MED ORDER — ROPINIROLE HCL 4 MG PO TABS
4.0000 mg | ORAL_TABLET | Freq: Every evening | ORAL | Status: DC | PRN
Start: 2023-03-03 — End: 2023-08-18

## 2023-03-03 MED ORDER — BISACODYL 10 MG RE SUPP: 10.0000 mg | RECTAL | 0 refills | Status: AC | PRN

## 2023-03-03 MED ORDER — LACTULOSE 20 GM/30ML PO SOLN: 20.0000 g | Freq: Two times a day (BID) | ORAL | 0 refills | Status: AC

## 2023-03-03 NOTE — Patient Instructions (Addendum)
Ask cardiology if they are okay with you taking any extra vitamin K.  Go to the lab on the way out.   If you have mychart we'll likely use that to update you.    Take care.  Glad to see you.

## 2023-03-03 NOTE — Telephone Encounter (Signed)
Please call pt. Awaiting overread but still has stool seen on xray.  Would try taking lactulose orally and use dulcolax suppository rectally.  I put in the referral to GI in the meantime.  Thanks.

## 2023-03-03 NOTE — Progress Notes (Unsigned)
New patient.   Craig Morgan designated if patient were incapacitated.    Still passing gas but minimal stools in spite of multiple enemas and miralax attempts.   Prev imaging d/w pt.   Alignment: Trace retrolisthesis of L2 on L3. Grade 1 anterolisthesis L4 on L5.   Vertebrae: No acute fracture or focal pathologic process. Postsurgical changes from L3-L5 spinal fusion and decompression. No evidence of perihardware lucency to suggest hardware complications.   Paraspinal and other soft tissues: Negative.   Disc levels: No CT evidence of high-grade spinal stenosis. No evidence of high-grade neural foraminal stenosis.   IMPRESSION: No acute fracture or traumatic listhesis.  ----------------------- IMPRESSION: 1. No acute abnormality in the abdomen or pelvis. 2. Moderate colonic stool load. 3. Colonic diverticulosis without diverticulitis. 4. Enlarged prostate. -----------------------  Recheck 124/70 on our cuff.  History of atrial fibrillation.  Craig Morgan is taking a supplemental vitamin K and I asked him to check with cardiology about that.  Taking requip at night.  1-2 tabs per night.  Rare use hydrocodone.    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  Craig Morgan had L calf atrophy prior to back surgery.    Meds, vitals, and allergies reviewed.   ROS: Per HPI unless specifically indicated in ROS section   GEN: nad, alert and oriented HEENT: mucous membranes moist NECK: supple w/o LA CV: IRR, not tachy PULM: ctab, no inc wob ABD: soft, +bs EXT: no edema SKIN: well perfused.  Intact B DP pulse.    30 minutes were devoted to patient care in this encounter (this includes time spent reviewing the patient's file/history, interviewing and examining the patient, counseling/reviewing plan with patient).

## 2023-03-04 NOTE — Telephone Encounter (Signed)
Spoke with patient and advised of instructions, he is agreeable to the referral to GI

## 2023-03-05 NOTE — Telephone Encounter (Signed)
Thanks.  Please list me as the PCP.  Referral is in.

## 2023-03-06 ENCOUNTER — Encounter: Payer: Self-pay | Admitting: Family Medicine

## 2023-03-06 DIAGNOSIS — K5900 Constipation, unspecified: Secondary | ICD-10-CM | POA: Insufficient documentation

## 2023-03-06 DIAGNOSIS — Z7189 Other specified counseling: Secondary | ICD-10-CM | POA: Insufficient documentation

## 2023-03-06 NOTE — Assessment & Plan Note (Signed)
This was the main issue for the patient currently.  He is still passing gas and some occasional stool so he is not completely obstructed.  Benign abdominal exam.  No fevers.  Minimal opiate use.  Still okay for outpatient follow-up.  Discussed checking KUB and then trying to arrange for a bowel regimen/GI referral.

## 2023-03-06 NOTE — Assessment & Plan Note (Signed)
Craig Morgan designated if patient were incapacitated.

## 2023-03-06 NOTE — Telephone Encounter (Signed)
Left message to call back  

## 2023-03-10 ENCOUNTER — Ambulatory Visit: Payer: Medicare HMO | Admitting: General Practice

## 2023-03-10 NOTE — Telephone Encounter (Signed)
Called patient daughter advised of below they verbalized understanding and will tell him.

## 2023-03-10 NOTE — Telephone Encounter (Signed)
-----   Message from Jonita Albee sent at 02/17/2023  7:34 AM EST ----- Please tell patient that his lab work showed normal WBC count, normal platelets, normal thyroid function, normal kidney function, and normal electrolytes. Hemoglobin is 12.9 and normal is 13, so it is a bit low. Overall stable since 7 months ago, unlikely to be causing his fatigue.   No changes to treatment plan, follow up with PCP   Thanks KJ

## 2023-03-17 ENCOUNTER — Encounter: Payer: Self-pay | Admitting: *Deleted

## 2023-03-17 ENCOUNTER — Ambulatory Visit (INDEPENDENT_AMBULATORY_CARE_PROVIDER_SITE_OTHER): Payer: Medicare HMO | Admitting: Family Medicine

## 2023-03-17 ENCOUNTER — Encounter: Payer: Self-pay | Admitting: Family Medicine

## 2023-03-17 VITALS — BP 118/74 | HR 87 | Temp 99.2°F | Ht 71.0 in | Wt 278.0 lb

## 2023-03-17 DIAGNOSIS — M48061 Spinal stenosis, lumbar region without neurogenic claudication: Secondary | ICD-10-CM | POA: Diagnosis not present

## 2023-03-17 DIAGNOSIS — M25519 Pain in unspecified shoulder: Secondary | ICD-10-CM | POA: Diagnosis not present

## 2023-03-17 NOTE — Patient Instructions (Signed)
I'll check on the referral to GI and neurosurgery.  Take care.  Glad to see you.

## 2023-03-17 NOTE — Progress Notes (Signed)
He hasn't hear about GI referral.  D/w pt that I would check on that.  He is moving his bowels better in the meantime.    L shoulder pain.  He fell getting out of the bobcat, landed on L shoulder.  This was a few years ago.  Had rotator cuff surgery 4 years ago.    He drives with his L hand.  More pain last week.  Less pain now and better ROM this week.  He prev had pain radiating down to the L hand.    B hip and lower back pain with walking.  No pain supine or sitting.  Pain standing, walking.  No pain with sitting again.  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  He had surgery with Dr. Danielle Dess prior.    Meds, vitals, and allergies reviewed.   ROS: Per HPI unless specifically indicated in ROS section   Nad Neck supple, no LA Rrr Ctab Intact shoulder range of motion bilaterally today without arm drop. Abd soft, not ttp Back nontender midline. 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

## 2023-03-21 DIAGNOSIS — M25519 Pain in unspecified shoulder: Secondary | ICD-10-CM | POA: Insufficient documentation

## 2023-03-21 NOTE — Assessment & Plan Note (Signed)
Better today, with previous injury and surgery history noted.  We did not intervene since he had improved recently.

## 2023-03-21 NOTE — Assessment & Plan Note (Signed)
He is having recurrent pain when he is standing and walking and he gets better with sitting and laying down.  He can concern is for spinal stenosis and I need neurosurgery input.  Referral placed.

## 2023-04-23 DIAGNOSIS — M4316 Spondylolisthesis, lumbar region: Secondary | ICD-10-CM | POA: Diagnosis not present

## 2023-05-21 ENCOUNTER — Ambulatory Visit: Payer: Medicare HMO | Attending: Cardiology | Admitting: Cardiology

## 2023-05-21 NOTE — Progress Notes (Deleted)
 Cardiology Office Note:  .   Date:  05/21/2023  ID:  NOLIN GRELL, DOB 1956/01/19, MRN [id number] PCP: Joaquim Nam, MD   [facility] Providers Cardiologist:  Yates Decamp, MD { Click to update primary MD,subspecialty MD or APP then REFRESH:1}  History of Present Illness: .   Craig Morgan is a 68 y.o. . male with a past medical history of permanent atrial fibrillation. OSA not on therapy, chronic leg edema due to venous insufficiency, obseity.   Discussed the use of AI scribe software for clinical note transcription with the patient, who gave verbal consent to proceed.  History of Present Illness            Labs   Lab Results  Component Value Date   CHOL 126 11/05/2018   HDL 34 (L) 11/05/2018   LDLCALC 72 11/05/2018   TRIG 98 11/05/2018   CHOLHDL 4.3 06/17/2011   Lab Results  Component Value Date   NA 137 02/24/2023   K 4.2 02/24/2023   CO2 25 02/24/2023   GLUCOSE 107 (H) 02/24/2023   BUN 20 02/24/2023   CREATININE 0.92 02/24/2023   CALCIUM 9.2 02/24/2023   EGFR 88 02/14/2023   GFRNONAA >60 02/24/2023      Latest Ref Rng & Units 02/24/2023   12:09 PM 02/14/2023   12:29 PM 07/10/2022    3:42 AM  BMP  Glucose 70 - 99 mg/dL 409  811  914   BUN 8 - 23 mg/dL 20  15  24    Creatinine 0.61 - 1.24 mg/dL 7.82  9.56  2.13   BUN/Creat Ratio 10 - 24  16    Sodium 135 - 145 mmol/L 137  143  141   Potassium 3.5 - 5.1 mmol/L 4.2  4.7  4.0   Chloride 98 - 111 mmol/L 108  105  108   CO2 22 - 32 mmol/L 25  25  23    Calcium 8.9 - 10.3 mg/dL 9.2  9.5  8.7       Latest Ref Rng & Units 02/24/2023   12:09 PM 02/14/2023   12:29 PM 07/09/2022    6:10 PM  CBC  WBC 4.0 - 10.5 K/uL 5.0  5.9  10.4   Hemoglobin 13.0 - 17.0 g/dL 08.6  57.8  46.9   Hematocrit 39.0 - 52.0 % 47.6  37.4  36.9   Platelets 150 - 400 K/uL 195  222  220    Lab Results  Component Value Date   HGBA1C 5.5 11/05/2018    Lab Results  Component Value Date   TSH 2.170 02/14/2023    External  Labs:  ***  ***ROS Physical Exam:   VS:  There were no vitals taken for this visit.   Wt Readings from Last 3 Encounters:  03/17/23 278 lb (126.1 kg)  03/03/23 274 lb 9.6 oz (124.6 kg)  02/24/23 280 lb (127 kg)     ***Physical Exam Studies Reviewed: Marland Kitchen    ECHOCARDIOGRAM COMPLETE 07/10/2022  1. Left ventricular ejection fraction, by estimation, is 55 to 60%. The left ventricle has normal function. The left ventricle has no regional wall motion abnormalities. There is mild left ventricular hypertrophy. Left ventricular diastolic function could not be evaluated. 2. Right ventricular systolic function is normal. The right ventricular size is normal. 3. Left atrial size was mildly dilated. 4. The mitral valve is degenerative. Mild mitral valve regurgitation. No evidence of mitral stenosis. 5. The aortic valve was not well visualized. Aortic  valve regurgitation is not visualized. No aortic stenosis is present. 6. There is upper limit of normal dilatation of the aortic root, measuring 37 mm. There is mild dilatation of the ascending aorta, measuring 39 mm.   Comparison(s): Prior study 05/22/2017: LVEF 55%, LAE mild, aortic root 3.9cm, see report for additional details.  EKG:         ***  Medications and allergies    Allergies  Allergen Reactions   Penicillins Hives     Current Outpatient Medications:    aspirin EC 81 MG tablet, Take 81 mg by mouth at bedtime., Disp: , Rfl:    B Complex Vitamins (B COMPLEX PO), Take 1 capsule by mouth at bedtime., Disp: , Rfl:    bisacodyl (DULCOLAX) 10 MG suppository, Place 1 suppository (10 mg total) rectally as needed for moderate constipation., Disp: 12 suppository, Rfl: 0   Boswellia 307 MG TABS, Take 307 mg by mouth daily., Disp: , Rfl:    Capsicum, Cayenne, (CAYENNE PO), Take 1 tablet by mouth daily., Disp: , Rfl:    HYDROcodone-acetaminophen (NORCO/VICODIN) 5-325 MG tablet, Take 1 tablet by mouth every 4 (four) hours as needed. (Patient  taking differently: Take 1 tablet by mouth every 4 (four) hours as needed. PRN), Disp: 10 tablet, Rfl: 0   Lactulose 20 GM/30ML SOLN, Take 30 mLs (20 g total) by mouth in the morning and at bedtime., Disp: 473 mL, Rfl: 0   MAGNESIUM PO, Take 1 tablet by mouth daily., Disp: , Rfl:    Multiple Vitamins-Minerals (ZINC PO), Take 1 tablet by mouth daily., Disp: , Rfl:    naproxen sodium (ALEVE) 220 MG tablet, Take 220 mg by mouth as needed., Disp: , Rfl:    [place] africanum 300-25 MG CAPS, Take 300 mg by mouth daily. 300-25, Disp: , Rfl:    POTASSIUM PO, Take 1 tablet by mouth at bedtime., Disp: , Rfl:    rOPINIRole (REQUIP) 4 MG tablet, Take 1-2 tablets (4-8 mg total) by mouth at bedtime as needed (leg spasms)., Disp: , Rfl:    saw palmetto 500 MG capsule, Take 500 mg by mouth daily., Disp: , Rfl:    tamsulosin (FLOMAX) 0.4 MG CAPS capsule, Take 1 capsule (0.4 mg total) by mouth daily after breakfast., Disp: 30 capsule, Rfl: 0   Vitamin D-Vitamin K (VITAMIN K2-VITAMIN D3 PO), Take 1 tablet by mouth daily., Disp: , Rfl:    ASSESSMENT AND PLAN: .      ICD-10-CM   1. Permanent atrial fibrillation (HCC)  I48.21     2. OSA (obstructive sleep apnea)  G47.33       1. Permanent atrial fibrillation (HCC) ***  2. OSA (obstructive sleep apnea) ***  Assessment and Plan                Signed,  Yates Decamp, MD, [facility] 05/21/2023, 9:41 AM [facility] [address] [place], [place] [address] Phone: [phone]. Fax:  [phone]

## 2023-07-22 ENCOUNTER — Ambulatory Visit: Payer: Medicare Other | Admitting: Cardiology

## 2023-08-07 ENCOUNTER — Encounter: Payer: Self-pay | Admitting: Family Medicine

## 2023-08-07 ENCOUNTER — Ambulatory Visit: Admitting: Family Medicine

## 2023-08-07 VITALS — BP 132/76 | HR 77 | Temp 98.2°F | Ht 71.0 in | Wt 280.4 lb

## 2023-08-07 DIAGNOSIS — G2581 Restless legs syndrome: Secondary | ICD-10-CM | POA: Diagnosis not present

## 2023-08-07 LAB — CBC WITH DIFFERENTIAL/PLATELET
Basophils Absolute: 0 10*3/uL (ref 0.0–0.1)
Basophils Relative: 0.4 % (ref 0.0–3.0)
Eosinophils Absolute: 0.1 10*3/uL (ref 0.0–0.7)
Eosinophils Relative: 1.6 % (ref 0.0–5.0)
HCT: 38.5 % — ABNORMAL LOW (ref 39.0–52.0)
Hemoglobin: 13.1 g/dL (ref 13.0–17.0)
Lymphocytes Relative: 8.4 % — ABNORMAL LOW (ref 12.0–46.0)
Lymphs Abs: 0.5 10*3/uL — ABNORMAL LOW (ref 0.7–4.0)
MCHC: 34 g/dL (ref 30.0–36.0)
MCV: 96.1 fl (ref 78.0–100.0)
Monocytes Absolute: 0.4 10*3/uL (ref 0.1–1.0)
Monocytes Relative: 6.6 % (ref 3.0–12.0)
Neutro Abs: 5.4 10*3/uL (ref 1.4–7.7)
Neutrophils Relative %: 83 % — ABNORMAL HIGH (ref 43.0–77.0)
Platelets: 248 10*3/uL (ref 150.0–400.0)
RBC: 4.01 Mil/uL — ABNORMAL LOW (ref 4.22–5.81)
RDW: 14.8 % (ref 11.5–15.5)
WBC: 6.5 10*3/uL (ref 4.0–10.5)

## 2023-08-07 LAB — COMPREHENSIVE METABOLIC PANEL WITH GFR
ALT: 24 U/L (ref 0–53)
AST: 24 U/L (ref 0–37)
Albumin: 4.4 g/dL (ref 3.5–5.2)
Alkaline Phosphatase: 92 U/L (ref 39–117)
BUN: 20 mg/dL (ref 6–23)
CO2: 27 meq/L (ref 19–32)
Calcium: 9.3 mg/dL (ref 8.4–10.5)
Chloride: 105 meq/L (ref 96–112)
Creatinine, Ser: 0.87 mg/dL (ref 0.40–1.50)
GFR: 90.26 mL/min (ref >60.00)
Glucose, Bld: 116 mg/dL — ABNORMAL HIGH (ref 70–99)
Potassium: 4.1 meq/L (ref 3.5–5.1)
Sodium: 142 meq/L (ref 135–145)
Total Bilirubin: 0.6 mg/dL (ref 0.2–1.2)
Total Protein: 6.9 g/dL (ref 6.0–8.3)

## 2023-08-07 LAB — VITAMIN B12: Vitamin B-12: 380 pg/mL (ref 211–911)

## 2023-08-07 LAB — IRON: Iron: 70 ug/dL (ref 42–165)

## 2023-08-07 LAB — TSH: TSH: 1.33 u[IU]/mL (ref 0.35–5.50)

## 2023-08-07 LAB — FERRITIN: Ferritin: 55.4 ng/mL (ref 22.0–322.0)

## 2023-08-07 NOTE — Patient Instructions (Signed)
 Go to the lab on the way out.   If you have mychart we'll likely use that to update you.    Take care.  Glad to see you. Please call Dr. Ellery Guthrie and ask about options.  I need his input.

## 2023-08-07 NOTE — Progress Notes (Signed)
 He had long standing leg jerkings/sx at night with prev improvement on requip .  Over the years he had gradual inc in dose.  Rx per outside clinic.  Lack of effect with med.  Had been taking up to 12 mg requip  at night.  I was not aware of that dosing until the OV today.    He is still dealing with back pain at baseline.  Had seen Dr. Ellery Guthrie in 03/2023.    Meds, vitals, and allergies reviewed.   ROS: Per HPI unless specifically indicated in ROS section   Nad Ncat Neck supple, no LA Rrr Ctab Abd soft not ttp Skin well perfused.  No tremor.  Altered sensation BLE at the feet.

## 2023-08-11 ENCOUNTER — Ambulatory Visit: Payer: Self-pay | Admitting: Family Medicine

## 2023-08-11 NOTE — Assessment & Plan Note (Signed)
 See notes on labs.  Previously improvement with Requip , required increasing dose, I was not aware until today that he is taking up to 12 mg of Requip  at night.  That is no longer effective for patient.  Discussed tapering.  I also need input from Dr. Ellery Guthrie about the patient's back, in case that is contributing to his current symptoms.

## 2023-08-14 NOTE — Telephone Encounter (Signed)
 Copied from CRM [id number]. Topic: Clinical - Lab/Test Results >> Aug 14, 2023  1:08 PM Abigail D wrote: Reason for CRM: Patient returning Avaletta's call for lab results, patient will wait for call back when she returns from lunch.

## 2023-08-15 NOTE — Telephone Encounter (Signed)
 Copied from CRM [id number]. Topic: Clinical - Lab/Test Results >> Aug 15, 2023  9:40 AM Chrystal Crape R wrote: Pt returning call for his lab results.

## 2023-08-18 ENCOUNTER — Other Ambulatory Visit: Payer: Self-pay | Admitting: Family Medicine

## 2023-08-18 DIAGNOSIS — I4819 Other persistent atrial fibrillation: Secondary | ICD-10-CM

## 2023-08-18 MED ORDER — GABAPENTIN 100 MG PO CAPS: 100.0000 mg | ORAL_CAPSULE | Freq: Every day | ORAL | 0 refills | Status: DC

## 2023-08-18 MED ORDER — ROPINIROLE HCL 4 MG PO TABS
ORAL_TABLET | ORAL | Status: DC
Start: 2023-08-18 — End: 2024-01-15

## 2023-08-29 ENCOUNTER — Encounter: Payer: Self-pay | Admitting: Family Medicine

## 2023-12-05 ENCOUNTER — Ambulatory Visit (INDEPENDENT_AMBULATORY_CARE_PROVIDER_SITE_OTHER)
Admission: RE | Admit: 2023-12-05 | Discharge: 2023-12-05 | Disposition: A | Discharge status: Home / Self Care | Source: Ambulatory Visit | Attending: Family Medicine | Admitting: Family Medicine

## 2023-12-05 ENCOUNTER — Ambulatory Visit: Admitting: Family Medicine

## 2023-12-05 ENCOUNTER — Encounter: Payer: Self-pay | Admitting: Family Medicine

## 2023-12-05 VITALS — BP 128/78 | HR 89 | Temp 98.6°F | Ht 71.0 in | Wt 279.6 lb

## 2023-12-05 DIAGNOSIS — G8929 Other chronic pain: Secondary | ICD-10-CM | POA: Diagnosis not present

## 2023-12-05 DIAGNOSIS — G2581 Restless legs syndrome: Secondary | ICD-10-CM

## 2023-12-05 DIAGNOSIS — M25512 Pain in left shoulder: Secondary | ICD-10-CM

## 2023-12-05 DIAGNOSIS — Z1211 Encounter for screening for malignant neoplasm of colon: Secondary | ICD-10-CM | POA: Diagnosis not present

## 2023-12-05 DIAGNOSIS — M19019 Primary osteoarthritis, unspecified shoulder: Secondary | ICD-10-CM | POA: Diagnosis not present

## 2023-12-05 MED ORDER — PREDNISONE 20 MG PO TABS: ORAL_TABLET | ORAL | 0 refills | Status: DC

## 2023-12-05 NOTE — Progress Notes (Unsigned)
 H/o L shoulder surgery.   L shoulder pain, possibly feels knot locally on the L anterior shoulder.  Pain radiating up into the neck and down the L arm.  Pain with L shoulder ROM, abduction, int/ext rotation.  He can feel a popping sensation in the shoulder.    Also having R lower back pain.  Intermittent.  No pain now.  No rash.  Noted in the last week or so.    He hasn't had colonoscopy done.  D/w pt about GI f/u.  Normal urination.  Letter given to patient about seeing GI.   He is tapering requip , down to 2 tabs per night.  Hasn't started gabapentin . D/w pt about tapering requip  more.    Meds, vitals, and allergies reviewed.   ROS: Per HPI unless specifically indicated in ROS section   L shoulder pain with int/ext rotation.  IRR   He has intermittent sx of nasal obstruction with unremarkable exam now.    Soft mass anterior to L shoulder.  Unclear if lipoma.

## 2023-12-05 NOTE — Patient Instructions (Addendum)
 I would cut requip  back to 1.5 tabs at night.  Call GI about follow up.  Xray on the way out.  Prednisone  with food, for pain.  Take care.  Glad to see you.

## 2023-12-07 ENCOUNTER — Ambulatory Visit: Payer: Self-pay | Admitting: Family Medicine

## 2023-12-07 DIAGNOSIS — G8929 Other chronic pain: Secondary | ICD-10-CM

## 2023-12-07 DIAGNOSIS — Z1211 Encounter for screening for malignant neoplasm of colon: Secondary | ICD-10-CM | POA: Insufficient documentation

## 2023-12-07 NOTE — Assessment & Plan Note (Signed)
 I asked him to call GI about follow up.

## 2023-12-07 NOTE — Assessment & Plan Note (Signed)
 Xray on the way out.  Prednisone  with food, for pain.  Steroid cautions discussed with patient.

## 2023-12-07 NOTE — Assessment & Plan Note (Signed)
 I would cut requip  back to 1.5 tabs at night, d/w pt.

## 2023-12-15 NOTE — Telephone Encounter (Signed)
 Copied from CRM #[id number]. Topic: Clinical - Lab/Test Results >> Dec 15, 2023 10:37 AM Treva T wrote: Reason for CRM: Received call from patient, states he is returning call to discuss xray imaging results.  Patient can be reached at [phone] to discuss further.  Patient is aware of same day call back.

## 2023-12-17 NOTE — Addendum Note (Signed)
 Addended by: CLEATUS ARLYSS RAMAN on: 12/17/2023 11:37 PM   Modules accepted: Orders

## 2024-01-12 ENCOUNTER — Other Ambulatory Visit: Payer: Self-pay | Admitting: Family Medicine

## 2024-01-12 MED ORDER — DULOXETINE HCL 20 MG PO CPEP: 20.0000 mg | ORAL_CAPSULE | Freq: Every day | ORAL | Status: DC

## 2024-01-15 ENCOUNTER — Ambulatory Visit: Admitting: Family Medicine

## 2024-01-15 ENCOUNTER — Encounter: Payer: Self-pay | Admitting: Family Medicine

## 2024-01-15 VITALS — BP 122/78 | HR 55 | Temp 98.6°F | Ht 71.0 in | Wt 278.5 lb

## 2024-01-15 DIAGNOSIS — F1290 Cannabis use, unspecified, uncomplicated: Secondary | ICD-10-CM | POA: Diagnosis not present

## 2024-01-15 DIAGNOSIS — G2581 Restless legs syndrome: Secondary | ICD-10-CM

## 2024-01-15 DIAGNOSIS — I4819 Other persistent atrial fibrillation: Secondary | ICD-10-CM

## 2024-01-15 DIAGNOSIS — G8929 Other chronic pain: Secondary | ICD-10-CM

## 2024-01-15 MED ORDER — DULOXETINE HCL 20 MG PO CPEP: 20.0000 mg | ORAL_CAPSULE | Freq: Every day | ORAL | 3 refills | Status: DC

## 2024-01-15 MED ORDER — ROPINIROLE HCL 4 MG PO TABS: 2.0000 mg | ORAL_TABLET | Freq: Every day | ORAL | Status: DC

## 2024-01-15 NOTE — Patient Instructions (Signed)
 I would try taking 2mg  requip  at night.   I would try taking cymbalta 20mg  daily.  See if that help with foot or back pain.  Update me in about 2 weeks.    Take care.  Glad to see you.

## 2024-01-15 NOTE — Progress Notes (Signed)
 He had quit taking requip , then took one dose of 4mg  requip  last night.  He slept better last night.  Discussed tapering down to 2mg  since he hadn't been on med consistently.   He smokes MJ at baseline, nightly.  Longstanding use.  Discussed.  Encouraged taper.  Still having back pain. D/w pt about seeing Dr. Colon.  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  He didn't get a call from ortho but can put up with sx as is.  Still walking with a cane.  Mood d/w pt.  No SI/HI.    He has shoulder and back pain at baseline with numbness and pain in the feet.    He didn't get relief from gabapentin  in the past.   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    Meds, vitals, and allergies reviewed.   ROS: Per HPI unless specifically indicated in ROS section   Nad [place]ck supple no LA Rrr Ctab Abd soft not ttp Dec monofilament L foot.  Sensation intact o/w.  Intact DP pulses.   Walking with a limp at baseline.

## 2024-01-18 DIAGNOSIS — G8929 Other chronic pain: Secondary | ICD-10-CM | POA: Insufficient documentation

## 2024-01-18 NOTE — Assessment & Plan Note (Signed)
 I would try taking 2mg  requip  at night.  He can update me as needed.

## 2024-01-18 NOTE — Assessment & Plan Note (Signed)
 Discussed options.  At this point okay for outpatient follow-up. I would try taking cymbalta 20mg  daily.  He can see if that helps with foot or back pain.  I asked him to update me in about 2 weeks, sooner if needed.

## 2024-01-18 NOTE — Assessment & Plan Note (Signed)
 Encourage taper.

## 2024-02-06 ENCOUNTER — Other Ambulatory Visit: Payer: Self-pay | Admitting: Family Medicine

## 2024-02-25 ENCOUNTER — Other Ambulatory Visit: Payer: Self-pay

## 2024-02-25 ENCOUNTER — Ambulatory Visit: Admitting: Orthopedic Surgery

## 2024-02-25 DIAGNOSIS — M25512 Pain in left shoulder: Secondary | ICD-10-CM | POA: Diagnosis not present

## 2024-02-25 DIAGNOSIS — M25462 Effusion, left knee: Secondary | ICD-10-CM

## 2024-02-25 DIAGNOSIS — M19212 Secondary osteoarthritis, left shoulder: Secondary | ICD-10-CM

## 2024-02-27 ENCOUNTER — Other Ambulatory Visit: Payer: Self-pay | Admitting: Family Medicine

## 2024-02-27 ENCOUNTER — Encounter: Payer: Self-pay | Admitting: Orthopedic Surgery

## 2024-02-27 DIAGNOSIS — I4819 Other persistent atrial fibrillation: Secondary | ICD-10-CM

## 2024-02-27 NOTE — Progress Notes (Unsigned)
 Office Visit Note   Patient: Craig Morgan           Date of Birth: [birth year]           MRN: [id number] Visit Date: 02/25/2024 Requested by: Cleatus Arlyss RAMAN, MD [address] [place],  [place] [address] PCP: Cleatus Arlyss RAMAN, MD  Subjective: Chief Complaint  Patient presents with   Left Shoulder - Pain    HPI: Craig Morgan is a 68 y.o. male who presents to the office reporting left shoulder pain as well as left knee pain.  Patient describes prior rotator cuff tear repair 8 years ago.  This was distal clavicle excision as well as biceps tenodesis and rotator cuff tear repair.  He is retired but likes to work on appointment.  Occasionally wakes him from sleep at night.  He is right-hand dominant.  Reports grinding and popping in the left shoulder.  Also describes muscle spasms.  Has tried injections and physical therapy without relief.  Describes decreased strength.  Patient also describes left inferior patellar pain.  He does use a cane in that right hand.  Denies any mechanical symptoms in the knee.              ROS: All systems reviewed are negative as they relate to the chief complaint within the history of present illness.  Patient denies fevers or chills.  Assessment & Plan: Visit Diagnoses:  1. Left shoulder pain, unspecified chronicity     Plan: Impression is left shoulder pain with fairly significant cystic changes in the humeral head consistent with prior rotator cuff tear repair.  Acromion is also thinned out.  On exam he does have some weakness and coarseness with passive range of motion.  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  We will try an ultrasound-guided injection into that left glenohumeral joint today to see if that helps calm the symptoms down.  I think he also has knee issues with mild effusion in the but good motion.  Aspiration and injection performed in the knee as well.  He will come back in 4 weeks where we can decide for or against further imaging on  the shoulder and he will need 3 views of the left knee with radiographs at that time.  Follow-Up Instructions: Return in about 4 weeks (around 03/24/2024).   Orders:  Orders Placed This Encounter  Procedures   US  Guided Needle Placement - No Linked Charges   No orders of the defined types were placed in this encounter.     Procedures: Large Joint Inj: L knee on 02/25/2024 10:12 PM Indications: diagnostic evaluation, joint swelling and pain Details: 18 G 1.5 in needle, superolateral approach  Arthrogram: No  Medications: 5 mL lidocaine  1 %; 4 mL bupivacaine  0.25 %; 30 mg triamcinolone  acetonide 40 MG/ML Outcome: tolerated well, no immediate complications Procedure, treatment alternatives, risks and benefits explained, specific risks discussed. Consent was given by the patient. Immediately prior to procedure a time out was called to verify the correct patient, procedure, equipment, support staff and site/side marked as required. Patient was prepped and draped in the usual sterile fashion.    Large Joint Inj: L glenohumeral on 02/25/2024 10:13 PM Indications: diagnostic evaluation and pain Details: 22 G 3.5 in needle, ultrasound-guided posterior approach  Arthrogram: No  Medications: 9 mL bupivacaine  0.5 %; 5 mL lidocaine  1 %; 30 mg triamcinolone  acetonide 40 MG/ML Outcome: tolerated well, no immediate complications Procedure, treatment alternatives, risks and benefits explained,  specific risks discussed. Consent was given by the patient. Immediately prior to procedure a time out was called to verify the correct patient, procedure, equipment, support staff and site/side marked as required. Patient was prepped and draped in the usual sterile fashion.       Clinical Data: No additional findings.  Objective: Vital Signs: There were no vitals taken for this visit.  Physical Exam:  Constitutional: Patient appears well-developed HEENT:  Head: Normocephalic Eyes:EOM are  normal Neck: Normal range of motion Cardiovascular: Normal rate Pulmonary/chest: Effort normal Neurologic: Patient is alert Skin: Skin is warm Psychiatric: Patient has normal mood and affect  Ortho Exam: Ortho exam demonstrates range of motion on the left shoulder of 40/90/150.  Subscap strength is 4+ out of 5 external rotation strength is 5 out of 5.  Does have some coarseness with passive range of motion at 90 degrees of abduction.  No Popeye deformity.  Cervical spine range of motion is full.  Motor or sensory function to the arm is intact.  No masses lymphadenopathy or skin changes noted in that shoulder girdle region.  Left knee has mild effusion but good range of motion with stable collateral cruciate ligaments.  A little bit of tenderness at the inferior pole of the patella but the extensor mechanism is intact.  Specialty Comments:  No specialty comments available.  Imaging: No results found.   PMFS History: Patient Active Problem List   Diagnosis Date Noted   Chronic pain 01/18/2024   Colon cancer screening 12/07/2023   Shoulder pain 03/21/2023   Advance care planning 03/06/2023   Constipation 03/06/2023   Intermittent claudication 02/28/2023   Spinal stenosis of lumbar region 02/28/2023   Acute kidney injury 07/10/2022   Marijuana use 07/10/2022   Syncope 07/10/2022   Vitamin D deficiency 01/27/2018   Vitamin B12 deficiency 01/27/2018   RLS (restless legs syndrome) 01/27/2018   Severe carpal tunnel syndrome of right wrist 09/15/2017   Severe carpal tunnel syndrome of left wrist 09/15/2017   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   Left atrial enlargement 06/12/2015   Acute back pain with sciatica 09/29/2013   OSA on CPAP 11/18/2012   Status post laparoscopic bilateral hernia repair 2010 in [place] 11/18/2012   Persistent atrial fibrillation (HCC) 11/18/2012   Past Medical History:  Diagnosis Date   Atrial flutter (HCC) 2012   Back pain    DDD (degenerative  disc disease), lumbosacral    Back   Dysrhythmia    history of A FIb   Family history of adverse reaction to anesthesia    Mother - PONV   GERD (gastroesophageal reflux disease)    Numbness    RIGHT LEG - KNEE TO ANKLE - STATES HE WAS GIVEN INJECTION ONCE FOR SCIATIC PROBLEM AND THE NUMBNESS BEGAN AFTER THE INJECTION.   Persistent atrial fibrillation (HCC)    Sciatica    Sleep apnea    intolerant to CPAP (03/04/22)    Family History  Problem Relation Age of Onset   COPD Mother    Heart attack Mother    Heart disease Father    Heart attack Father    Cancer Maternal Grandfather        lung   Colon cancer Neg Hx    Prostate cancer Neg Hx     Past Surgical History:  Procedure Laterality Date   ADENOIDECTOMY     APPENDECTOMY  1965   CARDIOVERSION  02/04/2012   Procedure: CARDIOVERSION;  Surgeon: Erick JONELLE Bergamo, MD;  Location: [facility]  ENDOSCOPY;  Service: Cardiovascular;  Laterality: N/A;   CARDIOVERSION N/A 05/16/2015   Procedure: CARDIOVERSION;  Surgeon: Gordy Bergamo, MD;  Location: [facility] ENDOSCOPY;  Service: Cardiovascular;  Laterality: N/A;   CARDIOVERSION N/A 06/05/2017   Procedure: CARDIOVERSION;  Surgeon: Elmira Newman PARAS, MD;  Location: [facility] ENDOSCOPY;  Service: Cardiovascular;  Laterality: N/A;   CARPAL TUNNEL RELEASE Left 09/24/2017   Procedure: CARPAL TUNNEL RELEASE ENDOSCOPIC;  Surgeon: Edie Norleen PARAS, MD;  Location: [facility];  Service: Orthopedics;  Laterality: Left;  sleep apnea   CARPAL TUNNEL RELEASE Right 10/29/2017   Procedure: ENDOSCOPIC CARPAL TUNNEL RELEASE WRIST;  Surgeon: Edie Norleen PARAS, MD;  Location: [facility];  Service: Orthopedics;  Laterality: Right;  sleep apnea   EYE SURGERY     LASIK EYE SURG X 2 LEFT EYE AND ONCE ON RT EYE   HEMORROIDECTOMY     HERNIA REPAIR     LEFT INGUINAL HERNIA;  2ND SURGERY TO DO REVISION LEFT INGUINAL HERNIA AND REPAIR RT INGUINAL HERNIA   INGUINAL HERNIA REPAIR Left 12/08/2012   Procedure: open left inguinal   EXPLORATION;  Surgeon: Donnice KATHEE Lunger, MD;  Location: [facility] ORS;  Service: General;  Laterality: Left;   LUMBAR SPINE SURGERY     Dr. Colon, 2023.   SHOULDER ARTHROSCOPY WITH OPEN ROTATOR CUFF REPAIR Left 09/14/2018   Procedure: LEFT SHOULDER ARTHROSCOPY WITH MINIE  OPEN ROTATOR CUFF REPAIR, BICEPS TENODESIS, DISTAL CLAVICLE EXCISION, SUBACROMIAL DECOMPRESSION, SLEEP APNEA;  Surgeon: Tobie Priest, MD;  Location: [facility] ORS;  Service: Orthopedics;  Laterality: Left;   TONSILLECTOMY     Social History   Occupational History   Occupation: [employer] Manager  Tobacco Use   Smoking status: Former    Current packs/day: 0.00    Average packs/day: 1 pack/day for 5.0 years (5.0 ttl pk-yrs)    Types: Cigarettes    Start date: 20    Quit date: 1998    Years since quitting: 27.9   Smokeless tobacco: Current    Types: Snuff   Tobacco comments:    Uses dips occasionally.  Vaping Use   Vaping status: Never Used  Substance and Sexual Activity   Alcohol use: Yes    Alcohol/week: 1.0 standard drink of alcohol    Types: 1 Cans of beer per week    Comment: rarely   Drug use: No   Sexual activity: Not on file

## 2024-02-27 NOTE — Telephone Encounter (Signed)
 Requip  Last filled:  06/12/23 Last OV:  01/15/24, med rev Next OV:  none

## 2024-02-28 MED ORDER — LIDOCAINE HCL 1 % IJ SOLN
5.0000 mL | INTRAMUSCULAR | Status: AC | PRN
  Administered 2024-02-25: 5 mL

## 2024-02-28 MED ORDER — TRIAMCINOLONE ACETONIDE 40 MG/ML IJ SUSP
30.0000 mg | INTRAMUSCULAR | Status: AC | PRN
  Administered 2024-02-25: 30 mg via INTRA_ARTICULAR

## 2024-02-28 MED ORDER — BUPIVACAINE HCL 0.25 % IJ SOLN
4.0000 mL | INTRAMUSCULAR | Status: AC | PRN
  Administered 2024-02-25: 4 mL via INTRA_ARTICULAR

## 2024-02-28 MED ORDER — BUPIVACAINE HCL 0.5 % IJ SOLN
9.0000 mL | INTRAMUSCULAR | Status: AC | PRN
  Administered 2024-02-25: 9 mL via INTRA_ARTICULAR

## 2024-02-29 NOTE — Telephone Encounter (Signed)
 Verify current use.  He had cut back on med.  Please let me know.  Thanks.

## 2024-03-24 ENCOUNTER — Ambulatory Visit: Admitting: Orthopedic Surgery
